# Patient Record
Sex: Male | Born: 1950 | Race: White | Hispanic: No | Marital: Married | Smoking: Never smoker
Health system: Southern US, Community
[De-identification: ages and names within clinical notes are randomized; demographics above are authoritative.]

## PROBLEM LIST (undated history)

## (undated) DIAGNOSIS — M25511 Pain in right shoulder: Secondary | ICD-10-CM

## (undated) DIAGNOSIS — M1990 Unspecified osteoarthritis, unspecified site: Secondary | ICD-10-CM

## (undated) DIAGNOSIS — M21372 Foot drop, left foot: Principal | ICD-10-CM

## (undated) DIAGNOSIS — I639 Cerebral infarction, unspecified: Secondary | ICD-10-CM

## (undated) DIAGNOSIS — Q2112 Patent foramen ovale: Secondary | ICD-10-CM

## (undated) DIAGNOSIS — Q211 Atrial septal defect: Secondary | ICD-10-CM

## (undated) DIAGNOSIS — I10 Essential (primary) hypertension: Secondary | ICD-10-CM

## (undated) HISTORY — DX: Foot drop, left foot: M21.372

## (undated) HISTORY — PX: ROTATOR CUFF REPAIR: SHX139

## (undated) HISTORY — PX: SHOULDER ARTHROSCOPY: SHX128

---

## 2011-01-08 ENCOUNTER — Other Ambulatory Visit (HOSPITAL_COMMUNITY): Payer: Self-pay | Admitting: Family Medicine

## 2011-01-08 ENCOUNTER — Ambulatory Visit (HOSPITAL_COMMUNITY)
Admission: RE | Admit: 2011-01-08 | Discharge: 2011-01-08 | Disposition: A | Discharge status: Home / Self Care | Payer: BC Managed Care – PPO | Source: Ambulatory Visit | Attending: Family Medicine | Admitting: Family Medicine

## 2011-01-08 DIAGNOSIS — M542 Cervicalgia: Secondary | ICD-10-CM | POA: Insufficient documentation

## 2011-01-08 DIAGNOSIS — R609 Edema, unspecified: Secondary | ICD-10-CM | POA: Insufficient documentation

## 2011-01-08 DIAGNOSIS — R221 Localized swelling, mass and lump, neck: Secondary | ICD-10-CM

## 2011-01-08 MED ORDER — IOHEXOL 300 MG/ML  SOLN
75.0000 mL | Freq: Once | INTRAMUSCULAR | Status: AC | PRN
  Administered 2011-01-08: 75 mL via INTRAVENOUS

## 2015-08-11 ENCOUNTER — Ambulatory Visit (HOSPITAL_COMMUNITY)
Admission: RE | Admit: 2015-08-11 | Discharge: 2015-08-11 | Disposition: A | Discharge status: Home / Self Care | Payer: BLUE CROSS/BLUE SHIELD | Source: Ambulatory Visit | Attending: Family Medicine | Admitting: Family Medicine

## 2015-08-11 ENCOUNTER — Other Ambulatory Visit (HOSPITAL_COMMUNITY): Payer: Self-pay | Admitting: Family Medicine

## 2015-08-11 DIAGNOSIS — M5032 Other cervical disc degeneration, mid-cervical region: Secondary | ICD-10-CM | POA: Diagnosis not present

## 2015-08-11 DIAGNOSIS — M5136 Other intervertebral disc degeneration, lumbar region: Secondary | ICD-10-CM | POA: Insufficient documentation

## 2015-08-11 DIAGNOSIS — M21372 Foot drop, left foot: Secondary | ICD-10-CM | POA: Insufficient documentation

## 2015-08-11 DIAGNOSIS — M25512 Pain in left shoulder: Secondary | ICD-10-CM | POA: Diagnosis not present

## 2015-08-11 DIAGNOSIS — M545 Low back pain: Secondary | ICD-10-CM | POA: Diagnosis present

## 2015-08-11 DIAGNOSIS — R531 Weakness: Secondary | ICD-10-CM | POA: Diagnosis not present

## 2015-08-11 DIAGNOSIS — M549 Dorsalgia, unspecified: Secondary | ICD-10-CM

## 2015-08-13 ENCOUNTER — Ambulatory Visit (INDEPENDENT_AMBULATORY_CARE_PROVIDER_SITE_OTHER): Payer: BLUE CROSS/BLUE SHIELD | Admitting: Neurology

## 2015-08-13 ENCOUNTER — Encounter: Payer: Self-pay | Admitting: Neurology

## 2015-08-13 VITALS — BP 133/87 | HR 75 | Ht 73.0 in | Wt 182.0 lb

## 2015-08-13 DIAGNOSIS — M21372 Foot drop, left foot: Secondary | ICD-10-CM

## 2015-08-13 HISTORY — DX: Foot drop, left foot: M21.372

## 2015-08-13 NOTE — Progress Notes (Signed)
Reason for visit: Left footdrop  Referring physician: Dr. Titus Dubin is a 64 y.o. male  History of present illness:  Mr. Koren is a 64 year old right-handed white male with a history of sudden onset of a footdrop that occurred 3 days ago. The patient woke up with the deficit, associated with some mild discomfort with weightbearing on the foot, but with no back pain or pain down the leg. The patient had some left anterolateral lower extremity numbness and tingling. The patient denies any issues with the right leg. He has not had any previous events of sudden weakness or numbness. He denies problems controlling the bowels or the bladder. He also noted a "crick in the neck" when he woke up on the day that he noticed the left sided footdrop. He denies any falls, he occasionally catches his toes with walking. He is sent to this office for further evaluation.  Past Medical History  Diagnosis Date  . Hypertension   . Left foot drop 08/13/2015    Past Surgical History  Procedure Laterality Date  . Rotator cuff repair      x3    Family History  Problem Relation Age of Onset  . COPD Mother   . Heart failure Mother   . Skin cancer Mother   . ALS Father     Social history:  reports that he has never smoked. He has never used smokeless tobacco. He reports that he does not drink alcohol or use illicit drugs.  Medications:  Prior to Admission medications   Medication Sig Start Date End Date Taking? Authorizing Provider  aspirin 81 MG tablet Take 81 mg by mouth daily.   Yes Historical Provider, MD  hydrochlorothiazide (HYDRODIURIL) 25 MG tablet Take 25 mg by mouth daily.   Yes Historical Provider, MD  lisinopril (PRINIVIL,ZESTRIL) 10 MG tablet Take 10 mg by mouth daily.   Yes Historical Provider, MD     No Known Allergies  ROS:  Out of a complete 14 system review of symptoms, the patient complains only of the following symptoms, and all other reviewed systems are  negative.  Numbness, weakness  Blood pressure 133/87, pulse 75, height  (1.854 m), weight 182 lb (82.555 kg).  Physical Exam  General: The patient is alert and cooperative at the time of the examination.  Eyes: Pupils are equal, round, and reactive to light. Discs are flat bilaterally.  Neck: The neck is supple, no carotid bruits are noted.  Respiratory: The respiratory examination is clear.  Cardiovascular: The cardiovascular examination reveals a regular rate and rhythm, no obvious murmurs or rubs are noted.  Skin: Extremities are without significant edema.  Neurologic Exam  Mental status: The patient is alert and oriented x 3 at the time of the examination. The patient has apparent normal recent and remote memory, with an apparently normal attention span and concentration ability.  Cranial nerves: Facial symmetry is present. There is good sensation of the face to pinprick and soft touch bilaterally. The strength of the facial muscles and the muscles to head turning and shoulder shrug are normal bilaterally. Speech is well enunciated, no aphasia or dysarthria is noted. Extraocular movements are full. Visual fields are full. The tongue is midline, and the patient has symmetric elevation of the soft palate. No obvious hearing deficits are noted.  Motor: The motor testing reveals 5 over 5 strength of all 4 extremities, with exception of mild weakness with dorsiflexion and eversion of the left foot.  There is good strength with inversion of the left foot. Good symmetric motor tone is noted throughout.  Sensory: Sensory testing is intact to pinprick, soft touch, vibration sensation, and position sense on all 4 extremities. No evidence of extinction is noted.  Coordination: Cerebellar testing reveals good finger-nose-finger and heel-to-shin bilaterally.  Gait and station: Gait is normal. Tandem gait is normal. Romberg is negative. No drift is seen. The patient is able to walk on the  toes bilaterally, he is able to walk on heels on the right foot, not on the left.  Reflexes: Deep tendon reflexes are symmetric and normal bilaterally. Toes are downgoing bilaterally.   Assessment/Plan:  1. Left peroneal neuropathy, left foot drop  The patient has an examination fully consistent with the left peroneal neuropathy. The etiology of this is not clear. The patient will be sent for blood work today, nerve conduction studies will be set up involving both lower extremities and EMG will be done on the left leg. The patient does not have a clinical examination consistent with a peripheral neuropathy. He will follow-up for the EMG evaluation.  Marlan Palau MD 08/13/2015 7:25 PM  [facility] [address] [place], [place] [address]  Phone [phone] Fax [phone]

## 2015-08-13 NOTE — Patient Instructions (Signed)
   We will check EMG and NCV studies and blood work.

## 2015-08-15 ENCOUNTER — Telehealth: Payer: Self-pay

## 2015-08-15 LAB — MULTIPLE MYELOMA PANEL, SERUM
ALBUMIN SERPL ELPH-MCNC: 4.2 g/dL (ref 2.9–4.4)
ALBUMIN/GLOB SERPL: 1.7 (ref 0.7–1.7)
ALPHA 1: 0.2 g/dL (ref 0.0–0.4)
Alpha2 Glob SerPl Elph-Mcnc: 0.6 g/dL (ref 0.4–1.0)
B-GLOBULIN SERPL ELPH-MCNC: 0.7 g/dL (ref 0.7–1.3)
GAMMA GLOB SERPL ELPH-MCNC: 1 g/dL (ref 0.4–1.8)
GLOBULIN, TOTAL: 2.5 g/dL (ref 2.2–3.9)
IgA/Immunoglobulin A, Serum: 78 mg/dL (ref 61–437)
IgG (Immunoglobin G), Serum: 869 mg/dL (ref 700–1600)
IgM (Immunoglobulin M), Srm: 244 mg/dL — ABNORMAL HIGH (ref 20–172)
Total Protein: 6.7 g/dL (ref 6.0–8.5)

## 2015-08-15 LAB — SEDIMENTATION RATE: SED RATE: 2 mm/h (ref 0–30)

## 2015-08-15 LAB — ANGIOTENSIN CONVERTING ENZYME

## 2015-08-15 LAB — RHEUMATOID FACTOR

## 2015-08-15 LAB — ANA W/REFLEX: ANA: NEGATIVE

## 2015-08-15 NOTE — Telephone Encounter (Signed)
I called the patient and relayed results. 

## 2015-08-15 NOTE — Telephone Encounter (Signed)
-----   Message from York Spaniel, MD sent at 08/15/2015 11:46 AM EDT -----  The blood work results are unremarkable. Please call the patient.  ----- Message -----    From: [facility] Lab Results In Interface    Sent: 08/14/2015   7:44 AM      To: York Spaniel, MD

## 2015-09-02 ENCOUNTER — Ambulatory Visit: Payer: Self-pay | Admitting: Neurology

## 2015-09-10 ENCOUNTER — Ambulatory Visit (INDEPENDENT_AMBULATORY_CARE_PROVIDER_SITE_OTHER): Payer: Self-pay | Admitting: Neurology

## 2015-09-10 ENCOUNTER — Encounter: Payer: Self-pay | Admitting: Neurology

## 2015-09-10 ENCOUNTER — Ambulatory Visit (INDEPENDENT_AMBULATORY_CARE_PROVIDER_SITE_OTHER): Payer: BLUE CROSS/BLUE SHIELD | Admitting: Neurology

## 2015-09-10 DIAGNOSIS — M21372 Foot drop, left foot: Secondary | ICD-10-CM

## 2015-09-10 NOTE — Procedures (Signed)
     HISTORY:  Fred Russell is a 64 year old gentleman who developed a left-sided foot drop spontaneously around 08/10/2015. The patient has already noted significant improvement of the strength of the left leg, he is now able to walk on heels bilaterally. He is returning for an evaluation. He denies any back pain or pain down the leg.  NERVE CONDUCTION STUDIES:  Nerve conduction studies were performed on both lower extremities. The distal motor latencies and motor amplitudes for the peroneal and posterior tibial nerves were within normal limits. The nerve conduction velocities for these nerves were also normal. The H reflex latencies were normal. The sensory latencies for the peroneal nerves were within normal limits.   EMG STUDIES:  A limited EMG study was performed on the left lower extremity:  The tibialis anterior muscle reveals 2 to 4K motor units with full recruitment. No fibrillations or positive waves were seen. The peroneus tertius muscle reveals 2 to 4K motor units with full recruitment. No fibrillations or positive waves were seen. The medial gastrocnemius muscle reveals 1 to 3K motor units with full recruitment. No fibrillations or positive waves were seen. The vastus lateralis muscle reveals 2 to 4K motor units with full recruitment. No fibrillations or positive waves were seen.   IMPRESSION:  Nerve conduction studies done on both lower extremity is were unremarkable, no evidence of a peripheral neuropathy is seen. Limited EMG evaluation of the left lower extremity shows denervation within the left peroneal nerve distribution, consistent with a left peroneal neuropathy at the fibular head.  Fred Russell. Fred Taraya Steward MD 09/10/2015 1:44 PM  [facility] [address] [place], [place] [id number]  Phone [phone] Fax [phone]

## 2015-09-10 NOTE — Progress Notes (Signed)
Please refer to EMG and nerve conduction study procedure note. 

## 2015-09-29 ENCOUNTER — Other Ambulatory Visit: Payer: Self-pay | Admitting: Orthopedic Surgery

## 2015-09-29 DIAGNOSIS — M8580 Other specified disorders of bone density and structure, unspecified site: Secondary | ICD-10-CM

## 2015-10-26 ENCOUNTER — Emergency Department (HOSPITAL_COMMUNITY): Payer: BLUE CROSS/BLUE SHIELD

## 2015-10-26 ENCOUNTER — Observation Stay (HOSPITAL_COMMUNITY): Payer: BLUE CROSS/BLUE SHIELD

## 2015-10-26 ENCOUNTER — Inpatient Hospital Stay (HOSPITAL_COMMUNITY)
Admission: EM | Admit: 2015-10-26 | Discharge: 2015-10-29 | DRG: 041 | Disposition: A | Discharge status: Home / Self Care | Payer: BLUE CROSS/BLUE SHIELD | Attending: Family Medicine | Admitting: Family Medicine

## 2015-10-26 ENCOUNTER — Encounter (HOSPITAL_COMMUNITY): Payer: Self-pay | Admitting: Emergency Medicine

## 2015-10-26 DIAGNOSIS — I63411 Cerebral infarction due to embolism of right middle cerebral artery: Principal | ICD-10-CM | POA: Diagnosis present

## 2015-10-26 DIAGNOSIS — I6330 Cerebral infarction due to thrombosis of unspecified cerebral artery: Secondary | ICD-10-CM

## 2015-10-26 DIAGNOSIS — I639 Cerebral infarction, unspecified: Secondary | ICD-10-CM

## 2015-10-26 DIAGNOSIS — Z86718 Personal history of other venous thrombosis and embolism: Secondary | ICD-10-CM

## 2015-10-26 DIAGNOSIS — M21372 Foot drop, left foot: Secondary | ICD-10-CM | POA: Diagnosis present

## 2015-10-26 DIAGNOSIS — E785 Hyperlipidemia, unspecified: Secondary | ICD-10-CM | POA: Diagnosis present

## 2015-10-26 DIAGNOSIS — Z7982 Long term (current) use of aspirin: Secondary | ICD-10-CM

## 2015-10-26 DIAGNOSIS — R4701 Aphasia: Secondary | ICD-10-CM | POA: Diagnosis present

## 2015-10-26 DIAGNOSIS — I10 Essential (primary) hypertension: Secondary | ICD-10-CM | POA: Diagnosis not present

## 2015-10-26 DIAGNOSIS — I712 Thoracic aortic aneurysm, without rupture: Secondary | ICD-10-CM | POA: Diagnosis present

## 2015-10-26 DIAGNOSIS — Z66 Do not resuscitate: Secondary | ICD-10-CM | POA: Diagnosis present

## 2015-10-26 DIAGNOSIS — Z79899 Other long term (current) drug therapy: Secondary | ICD-10-CM

## 2015-10-26 DIAGNOSIS — I6350 Cerebral infarction due to unspecified occlusion or stenosis of unspecified cerebral artery: Secondary | ICD-10-CM | POA: Diagnosis not present

## 2015-10-26 DIAGNOSIS — R29810 Facial weakness: Secondary | ICD-10-CM | POA: Diagnosis present

## 2015-10-26 DIAGNOSIS — R29701 NIHSS score 1: Secondary | ICD-10-CM | POA: Diagnosis present

## 2015-10-26 DIAGNOSIS — Q211 Atrial septal defect: Secondary | ICD-10-CM

## 2015-10-26 DIAGNOSIS — N179 Acute kidney failure, unspecified: Secondary | ICD-10-CM | POA: Diagnosis present

## 2015-10-26 DIAGNOSIS — Q2112 Patent foramen ovale: Secondary | ICD-10-CM | POA: Insufficient documentation

## 2015-10-26 LAB — RAPID URINE DRUG SCREEN, HOSP PERFORMED
Amphetamines: NOT DETECTED
BARBITURATES: NOT DETECTED
BENZODIAZEPINES: NOT DETECTED
Cocaine: NOT DETECTED
Opiates: NOT DETECTED
Tetrahydrocannabinol: NOT DETECTED

## 2015-10-26 LAB — COMPREHENSIVE METABOLIC PANEL
ALT: 18 U/L (ref 17–63)
AST: 23 U/L (ref 15–41)
Albumin: 4 g/dL (ref 3.5–5.0)
Alkaline Phosphatase: 45 U/L (ref 38–126)
Anion gap: 7 (ref 5–15)
BILIRUBIN TOTAL: 0.9 mg/dL (ref 0.3–1.2)
BUN: 19 mg/dL (ref 6–20)
CHLORIDE: 102 mmol/L (ref 101–111)
CO2: 29 mmol/L (ref 22–32)
CREATININE: 1.45 mg/dL — AB (ref 0.61–1.24)
Calcium: 9.4 mg/dL (ref 8.9–10.3)
GFR calc Af Amer: 57 mL/min — ABNORMAL LOW (ref >60)
GFR, EST NON AFRICAN AMERICAN: 49 mL/min — AB (ref >60)
Glucose, Bld: 115 mg/dL — ABNORMAL HIGH (ref 65–99)
Potassium: 3.7 mmol/L (ref 3.5–5.1)
Sodium: 138 mmol/L (ref 135–145)
Total Protein: 6.5 g/dL (ref 6.5–8.1)

## 2015-10-26 LAB — I-STAT CHEM 8, ED
BUN: 24 mg/dL — ABNORMAL HIGH (ref 6–20)
CHLORIDE: 98 mmol/L — AB (ref 101–111)
CREATININE: 1.4 mg/dL — AB (ref 0.61–1.24)
Calcium, Ion: 1.15 mmol/L (ref 1.13–1.30)
GLUCOSE: 109 mg/dL — AB (ref 65–99)
HCT: 45 % (ref 39.0–52.0)
Hemoglobin: 15.3 g/dL (ref 13.0–17.0)
Potassium: 3.7 mmol/L (ref 3.5–5.1)
SODIUM: 139 mmol/L (ref 135–145)
TCO2: 30 mmol/L (ref 0–100)

## 2015-10-26 LAB — DIFFERENTIAL
BASOS ABS: 0 10*3/uL (ref 0.0–0.1)
BASOS PCT: 0 %
Eosinophils Absolute: 0 10*3/uL (ref 0.0–0.7)
Eosinophils Relative: 0 %
LYMPHS ABS: 1.5 10*3/uL (ref 0.7–4.0)
LYMPHS PCT: 18 %
MONOS PCT: 4 %
Monocytes Absolute: 0.3 10*3/uL (ref 0.1–1.0)
NEUTROS ABS: 6.5 10*3/uL (ref 1.7–7.7)
Neutrophils Relative %: 78 %

## 2015-10-26 LAB — CBC
HEMATOCRIT: 41.2 % (ref 39.0–52.0)
HEMOGLOBIN: 14.1 g/dL (ref 13.0–17.0)
MCH: 31.8 pg (ref 26.0–34.0)
MCHC: 34.2 g/dL (ref 30.0–36.0)
MCV: 93 fL (ref 78.0–100.0)
Platelets: 177 10*3/uL (ref 150–400)
RBC: 4.43 MIL/uL (ref 4.22–5.81)
RDW: 12.9 % (ref 11.5–15.5)
WBC: 8.4 10*3/uL (ref 4.0–10.5)

## 2015-10-26 LAB — URINALYSIS, ROUTINE W REFLEX MICROSCOPIC
BILIRUBIN URINE: NEGATIVE
Glucose, UA: NEGATIVE mg/dL
HGB URINE DIPSTICK: NEGATIVE
KETONES UR: NEGATIVE mg/dL
Leukocytes, UA: NEGATIVE
Nitrite: NEGATIVE
PROTEIN: NEGATIVE mg/dL
Specific Gravity, Urine: 1.009 (ref 1.005–1.030)
pH: 6.5 (ref 5.0–8.0)

## 2015-10-26 LAB — PROTIME-INR
INR: 1.1 (ref 0.00–1.49)
Prothrombin Time: 14.4 seconds (ref 11.6–15.2)

## 2015-10-26 LAB — GLUCOSE, CAPILLARY: Glucose-Capillary: 112 mg/dL — ABNORMAL HIGH (ref 65–99)

## 2015-10-26 LAB — APTT: APTT: 29 s (ref 24–37)

## 2015-10-26 LAB — TSH: TSH: 2.885 u[IU]/mL (ref 0.350–4.500)

## 2015-10-26 LAB — ETHANOL: Alcohol, Ethyl (B): <5 mg/dL (ref <5)

## 2015-10-26 LAB — I-STAT TROPONIN, ED: TROPONIN I, POC: 0 ng/mL (ref 0.00–0.08)

## 2015-10-26 MED ORDER — STROKE: EARLY STAGES OF RECOVERY BOOK
Freq: Once | Status: AC
  Administered 2015-10-26: 18:00:00
  Filled 2015-10-26: qty 1

## 2015-10-26 MED ORDER — ASPIRIN 300 MG RE SUPP
300.0000 mg | Freq: Every day | RECTAL | Status: DC
  Filled 2015-10-26: qty 1

## 2015-10-26 MED ORDER — GADOBENATE DIMEGLUMINE 529 MG/ML IV SOLN
17.0000 mL | Freq: Once | INTRAVENOUS | Status: AC | PRN
  Administered 2015-10-26: 17 mL via INTRAVENOUS

## 2015-10-26 MED ORDER — HYDROCHLOROTHIAZIDE 25 MG PO TABS
25.0000 mg | ORAL_TABLET | Freq: Every day | ORAL | Status: DC
  Administered 2015-10-27 – 2015-10-29 (×3): 25 mg via ORAL
  Filled 2015-10-26 (×3): qty 1

## 2015-10-26 MED ORDER — ENOXAPARIN SODIUM 30 MG/0.3ML ~~LOC~~ SOLN
30.0000 mg | SUBCUTANEOUS | Status: DC
  Administered 2015-10-26: 30 mg via SUBCUTANEOUS
  Filled 2015-10-26: qty 0.3

## 2015-10-26 MED ORDER — ASPIRIN 325 MG PO TABS
325.0000 mg | ORAL_TABLET | Freq: Every day | ORAL | Status: DC
  Administered 2015-10-27: 325 mg via ORAL
  Filled 2015-10-26: qty 1

## 2015-10-26 MED ORDER — ASPIRIN 300 MG RE SUPP: 300.0000 mg | Freq: Every day | RECTAL | Status: DC

## 2015-10-26 MED ORDER — ASPIRIN EC 81 MG PO TBEC
243.0000 mg | DELAYED_RELEASE_TABLET | Freq: Once | ORAL | Status: AC
  Administered 2015-10-26: 243 mg via ORAL
  Filled 2015-10-26: qty 3

## 2015-10-26 MED ORDER — ASPIRIN EC 81 MG PO TBEC: 244.0000 mg | DELAYED_RELEASE_TABLET | Freq: Once | ORAL | Status: DC

## 2015-10-26 MED ORDER — SENNOSIDES-DOCUSATE SODIUM 8.6-50 MG PO TABS: 1.0000 | ORAL_TABLET | Freq: Every evening | ORAL | Status: DC | PRN

## 2015-10-26 MED ORDER — ASPIRIN 325 MG PO TABS: 325.0000 mg | ORAL_TABLET | Freq: Every day | ORAL | Status: DC

## 2015-10-26 NOTE — Consult Note (Signed)
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 There is question of slurred speech and told him he should come to checked out.  On arrival, a code stroke was called and the CT head was performed which did show a right frontal infarct.  He apparently has said some odd things such as giving the wrong winning team of a game he should have known about, as well as miss quoting his Estate manager/land agent.  LKW: 8 AM tpa given?: no, out of IV TPA window    ROS: A 14 point ROS was performed and is negative except as noted in the HPI.   Past Medical History  Diagnosis Date  . Hypertension   . Left foot drop 08/13/2015     Family History  Problem Relation Age of Onset  . COPD Mother   . Heart failure Mother   . Skin cancer Mother   . ALS Father      Social History:  reports that he has never smoked. He has never used smokeless tobacco. He reports that he does not drink alcohol or use illicit drugs.   Exam: Current vital signs: BP 143/88 mmHg  Pulse 64  Temp(Src) 98.4 F (36.9 C) (Oral)  Resp 16  Ht  (1.88 m)  Wt 79.379 kg (175 lb)  BMI 22.46 kg/m2  SpO2 98% Vital signs in last 24 hours: Temp:  [97.7 F (36.5 C)-98.4 F (36.9 C)] 98.4 F (36.9 C) (12/11 1645) Pulse Rate:  [64-85] 64 (12/11 1645) Resp:  [12-24] 16 (12/11 1645) BP: (143-165)/(86-100) 143/88 mmHg (12/11 1645) SpO2:  [98 %-100 %] 98 % (12/11 1645) Weight:  [79.379 kg (175 lb)] 79.379 kg (175 lb) (12/11 1449)   Physical Exam  Constitutional: Appears well-developed and well-nourished.  Psych: Affect appropriate to situation Eyes: No scleral injection HENT:  No OP obstrucion Head: Normocephalic.  Cardiovascular: Normal rate and regular rhythm.  Respiratory: Effort normal and breath sounds normal to anterior ascultation GI: Soft.  No distension. There is no tenderness.  Skin: WDI  Neuro: Mental Status: Patient is awake, alert, oriented to person, place, month, year, and situation. Patient is able to give a clear and coherent history. No signs of aphasia or neglect Cranial Nerves: II: Visual Fields are full. Pupils are equal, round, and reactive to light.   III,IV, VI: EOMI without ptosis or diploplia.  V: Facial sensation is symmetric to temperature VII: Facial movement is notable for mild left facial weakness VIII: hearing is intact to voice X: Uvula elevates symmetrically XI: Shoulder shrug is symmetric. XII: tongue is midline without atrophy or fasciculations.  Motor: Tone is normal. Bulk is normal. 5/5 strength was present in all four extremities.  Sensory: Sensation is symmetric to light touch and temperature in the arms and legs. Deep Tendon Reflexes: 2+ and symmetric in the biceps and patellae.  Plantars: Toes are downgoing bilaterally.  Cerebellar: FNF and HKS are intact bilaterally         I have reviewed labs in epic and the results pertinent to this consultation are: Mildly elevated creatinine  I have reviewed the images obtained: CT head-hypodensity  in the right frontal region  Impression:  64 year old male with a history of hypertension who presents with right frontal infarct. Location is concerning for possible embolic nature, he will need a further workup.  Recommendations: 1. HgbA1c, fasting lipid panel 2. MRI, MRA  of the brain without contrast, MRA of the neck with contrast 3. Frequent neuro checks 4. Echocardiogram 5. Prophylactic therapy-Antiplatelet med: Aspirin - dose 325mg  PO or 300mg  PR 6. Risk factor modification 7. Telemetry monitoring 8. PT consult, OT consult, Speech consult   Ritta SlotMcNeill  Merwin Breden, MD [facility] [phone]  If 7pm- 7am, please page neurology on call as listed in AMION.

## 2015-10-26 NOTE — ED Notes (Signed)
LSN is 800. Not 1130

## 2015-10-26 NOTE — H&P (Signed)
Family Medicine Teaching [facility] Admission History and Physical Service Pager: [phone]  Patient name: Fred Russell Medical record number: [id number] Date of birth: 1951-04-17 Age: 64 y.o. Gender: male  Primary Care Provider: Milana Obey, MD Consultants: Neuro Code Status: DNR (discussed on admission)  Chief Complaint: Facial Droop  Assessment and Plan: Fred Russell is a 64 y.o. male presenting with . PMH is significant for HTN.   #CVA: Found to have aphasia, facial droop, and altered mental status this morning at church. CT head showed new small right anterior frontal infarct. Not a candidate for tPA. Asymptomatic on my exam so symptoms have greatly improved. Code stroke order set was initiated in ED. Already taken ASA  at home. Troponin was negative. ECG overall unremarkable.  - admit for observation, Dr. Pollie Meyer attending - Neuro consulted, appreciate recs - A1c, TSH, and lipids for risk stratification - echo ordered - neuro checks, tele - ASA 325 daily; already took  tablet this morning - PT/OT/SLP evaluation - DG chest pending  - MRI, MRA of the brain without contrast, MRA of the neck with contrast  #HTN: Bps elevated in ED most likely 2/2 stroke. He has taken his BP medications already today. Will allow for permissive HTN. States BP usually well controlled and <140/80.  -Hold Lisinopril; restart HCTZ in AM  FEN/GI: NPO until passes swallow eval; saline lock IV Prophylaxis: Lovenox  Disposition: Admit to med-surg for stroke work-up.  History of Present Illness:  Fred Russell is a 64 y.o. male presenting with with aphasia, confusion, and left facial droop. 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 He states that members of the church stated that he was having aphasia, confusion, facial droop with drooling. Wife also mentioned that people were saying that patient was staring off blankly and not acting like himself  Patient believes the symptoms were exaggerated. He does not recall having any of these symptoms and thinks that people were exaggerating.   Wife who is present in room states that patient appeared normal last night without symptoms and again briefly this morning when she saw him.  Patient denies any weakness, headaches, chest pain, and shortness of breath. He states that he is a pretty healthy gentleman who exercises daily. Only medical condition is high blood pressure that is well controlled on medications.  On arrival, a code stroke was called in ED.  Review Of Systems: Per HPI with the following additions: fevers, abdominal pain. Otherwise the remainder of the systems were negative.  Patient Active Problem List   Diagnosis Date Noted  . Left foot drop 08/13/2015    Past Medical History: Past Medical History  Diagnosis Date  . Hypertension   . Left foot drop 08/13/2015    Past Surgical History: Past Surgical History  Procedure Laterality Date  . Rotator cuff repair      x3    Social History: Social History  Substance Use Topics  . Smoking status: Never Smoker   . Smokeless tobacco: Never Used  . Alcohol Use: No   No illicit drugs  Exercises daily  Additional social history: Lives with wife, retired Education officer, community, pastor Please also refer to relevant sections of EMR.  Family History: Family History  Problem Relation Age of Onset  . COPD Mother   . Heart failure Mother   . Skin cancer Mother   . ALS Father    Allergies and Medications: No Known Allergies No current facility-administered  medications on file prior to encounter.   Current Outpatient Prescriptions on File Prior to Encounter  Medication Sig Dispense Refill  . aspirin 81 MG tablet Take 81 mg by mouth daily.    . hydrochlorothiazide (HYDRODIURIL) 25 MG tablet Take 25 mg by mouth daily.    [name] lisinopril (PRINIVIL,ZESTRIL) 10 MG tablet Take 10 mg by mouth daily.      Objective: BP 147/92 mmHg  Pulse 69   Temp(Src) 98 F (36.7 C) (Oral)  Resp 12  Ht  (1.88 m)  Wt 175 lb (79.379 kg)  BMI 22.46 kg/m2  SpO2 100% Exam: General: alert, well-developed, NAD, cooperative HEET: NCAT, vision grossly intact, PERRLA, no injection and anicteric. EOMI. MMM, oral mucosa and oropharynx reveal no lesions or exudates.  Neck: supple, full ROM, no thyromegaly. No deformities, masses, or tenderness noted. No carotid bruit appreciated.  Lungs: CTAB, normal respiratory effort, no crackles, and no wheezes.  Heart: RRR, no M/R/G.  Abdomen: Bowel sounds normal; abdomen soft and nontender. No masses, organomegaly or hernias noted. no guarding, no rebound tenderness Pulses: DP/PT are full and equal bilaterally.  Extremities: No cyanosis, clubbing, edema Neurologic: No focal deficits, CN II-XII intact,+5 strength globally, sensation grossly intact, A&Ox3. Deep tendon reflexes symmetrical and normal. No aphasia.  Skin: Intact without suspicious lesions or rashes. Warm and dry. Psych: Mood and affect are normal; no evidence of anxiety or depression.  Labs and Imaging: Results for orders placed or performed during the hospital encounter of 10/26/15 (from the past 24 hour(s))  Ethanol     Status: None   Collection Time: 10/26/15  3:16 PM  Result Value Ref Range   Alcohol, Ethyl (B) <5 <5 mg/dL  Protime-INR     Status: None   Collection Time: 10/26/15  3:17 PM  Result Value Ref Range   Prothrombin Time 14.4 11.6 - 15.2 seconds   INR 1.10 0.00 - 1.49  APTT     Status: None   Collection Time: 10/26/15  3:17 PM  Result Value Ref Range   aPTT 29 24 - 37 seconds  CBC     Status: None   Collection Time: 10/26/15  3:17 PM  Result Value Ref Range   WBC 8.4 4.0 - 10.5 K/uL   RBC 4.43 4.22 - 5.81 MIL/uL   Hemoglobin 14.1 13.0 - 17.0 g/dL   HCT 40.9 81.1 - 91.4 %   MCV 93.0 78.0 - 100.0 fL   MCH 31.8 26.0 - 34.0 pg   MCHC 34.2 30.0 - 36.0 g/dL   RDW 78.2 95.6 - 21.3 %   Platelets 177 150 - 400 K/uL   Differential     Status: None   Collection Time: 10/26/15  3:17 PM  Result Value Ref Range   Neutrophils Relative % 78 %   Neutro Abs 6.5 1.7 - 7.7 K/uL   Lymphocytes Relative 18 %   Lymphs Abs 1.5 0.7 - 4.0 K/uL   Monocytes Relative 4 %   Monocytes Absolute 0.3 0.1 - 1.0 K/uL   Eosinophils Relative 0 %   Eosinophils Absolute 0.0 0.0 - 0.7 K/uL   Basophils Relative 0 %   Basophils Absolute 0.0 0.0 - 0.1 K/uL  Comprehensive metabolic panel     Status: Abnormal   Collection Time: 10/26/15  3:17 PM  Result Value Ref Range   Sodium 138 135 - 145 mmol/L   Potassium 3.7 3.5 - 5.1 mmol/L   Chloride 102 101 - 111 mmol/L   CO2 29  22 - 32 mmol/L   Glucose, Bld 115 (H) 65 - 99 mg/dL   BUN 19 6 - 20 mg/dL   Creatinine, Ser 1.611.45 (H) 0.61 - 1.24 mg/dL   Calcium 9.4 8.9 - 09.610.3 mg/dL   Total Protein 6.5 6.5 - 8.1 g/dL   Albumin 4.0 3.5 - 5.0 g/dL   AST 23 15 - 41 U/L   ALT 18 17 - 63 U/L   Alkaline Phosphatase 45 38 - 126 U/L   Total Bilirubin 0.9 0.3 - 1.2 mg/dL   GFR calc non Af Amer 49 (L) >60 mL/min   GFR calc Af Amer 57 (L) >60 mL/min   Anion gap 7 5 - 15  I-stat troponin, ED ([facility])     Status: None   Collection Time: 10/26/15  3:18 PM  Result Value Ref Range   Troponin i, poc 0.00 0.00 - 0.08 ng/mL   Comment 3          I-Stat Chem 8, ED  (not at [facility], [facility])     Status: Abnormal   Collection Time: 10/26/15  3:20 PM  Result Value Ref Range   Sodium 139 135 - 145 mmol/L   Potassium 3.7 3.5 - 5.1 mmol/L   Chloride 98 (L) 101 - 111 mmol/L   BUN 24 (H) 6 - 20 mg/dL   Creatinine, Ser 0.451.40 (H) 0.61 - 1.24 mg/dL   Glucose, Bld 409109 (H) 65 - 99 mg/dL   Calcium, Ion 8.111.15 9.141.13 - 1.30 mmol/L   TCO2 30 0 - 100 mmol/L   Hemoglobin 15.3 13.0 - 17.0 g/dL   HCT 78.245.0 95.639.0 - 21.352.0 %   Ct Head Wo Contrast  10/26/2015  CLINICAL DATA:  Acute stroke symptoms, left facial droop, confusion, ectasia complicated stroke EXAM: CT HEAD WITHOUT CONTRAST TECHNIQUE: Contiguous axial images were  obtained from the base of the skull through the vertex without contrast. COMPARISON:  None available FINDINGS: Small right frontal ill-defined hypodensity with loss of gray-white differentiation, images 13-17, compatible with a small acute stroke involving the anterior MCA territory. No associated intracranial hemorrhage, significant mass effect, midline shift, herniation or hydrocephalus. Symmetric midline ventricles. Cisterns are patent. No cerebellar abnormality. Atherosclerosis of the intracranial vessels of the skullbase. Orbits are symmetric. Mastoids and sinuses remain clear. No skull abnormality. IMPRESSION: Small right anterior frontal hypodensity compatible with acute infarction, no associated hemorrhage or significant mass effect. These results were called by telephone at the time of interpretation on 10/26/2015 at 3:15 pm to Dr. Amada JupiterKirkpatrick, who verbally acknowledged these results. Electronically Signed   By: Judie PetitM.  Shick M.D.   On: 10/26/2015 15:16    Pincus LargeJazma Y Donnis Phaneuf, DO 10/26/2015, 3:40 PM PGY-2, [facility] FPTS Intern pager: [phone], text pages welcome

## 2015-10-26 NOTE — ED Notes (Signed)
q2H neuro checks per kirkpatrick

## 2015-10-26 NOTE — ED Notes (Signed)
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 Pt with left sided facial droop. Per family pt speech is cleared but confusion remains. Pt does not recognize that he had confusion or facial droop.

## 2015-10-26 NOTE — ED Provider Notes (Addendum)
CSN: [id number]     Arrival date & time 10/26/15  1433 History   First MD Initiated Contact with Patient 10/26/15 1501     Chief Complaint  Patient presents with  . Facial Droop  . Aphasia  . Altered Mental Status     (Consider location/radiation/quality/duration/timing/severity/associated sxs/prior Treatment) HPI Comments: Patient is a 64 year old male with a history of hypertension presenting today with strokelike symptoms. His wife states she saw him around 8 AM this morning and he seemed normal but she only had a very brief interaction with him. When he was at church around 11:30 someone noticed that he had left-sided facial droop, slurred speech, staring off into the distance and aphasia. Patient does not recall any of this but family states he still slightly confused. At no time did he have chest pain, shortness of breath, palpitations, headache, syncope. He has never had symptoms like this before. He has not changed any medications recently and took all of his morning medications prior to leaving the house. He currently has no complaints.  Patient is a 64 y.o. male presenting with altered mental status. The history is provided by the patient and the spouse.  Altered Mental Status   Past Medical History  Diagnosis Date  . Hypertension   . Left foot drop 08/13/2015   Past Surgical History  Procedure Laterality Date  . Rotator cuff repair      x3   Family History  Problem Relation Age of Onset  . COPD Mother   . Heart failure Mother   . Skin cancer Mother   . ALS Father    Social History  Substance Use Topics  . Smoking status: Never Smoker   . Smokeless tobacco: Never Used  . Alcohol Use: No    Review of Systems  All other systems reviewed and are negative.     Allergies  Review of patient's allergies indicates no known allergies.  Home Medications   Prior to Admission medications   Medication Sig Start Date End Date Taking? Authorizing Provider  aspirin 81  MG tablet Take 81 mg by mouth daily.    Historical Provider, MD  hydrochlorothiazide (HYDRODIURIL) 25 MG tablet Take 25 mg by mouth daily.    Historical Provider, MD  lisinopril (PRINIVIL,ZESTRIL) 10 MG tablet Take 10 mg by mouth daily.    Historical Provider, MD   BP 165/86 mmHg  Pulse 85  Temp(Src) 97.7 F (36.5 C) (Oral)  Resp 18  Ht 6\' 2"  (1.88 m)  Wt 175 lb (79.379 kg)  BMI 22.46 kg/m2  SpO2 100% Physical Exam  Constitutional: He is oriented to person, place, and time. He appears well-developed and well-nourished. No distress.  HENT:  Head: Normocephalic and atraumatic.  Mouth/Throat: Oropharynx is clear and moist.  Eyes: Conjunctivae and EOM are normal. Pupils are equal, round, and reactive to light.  Neck: Normal range of motion. Neck supple. No JVD present. Carotid bruit is not present.  Cardiovascular: Normal rate, regular rhythm and intact distal pulses.   No murmur heard. Pulmonary/Chest: Effort normal and breath sounds normal. No respiratory distress. He has no wheezes. He has no rales.  Abdominal: Soft. He exhibits no distension. There is no tenderness. There is no rebound and no guarding.  Musculoskeletal: Normal range of motion. He exhibits no edema or tenderness.  Neurological: He is alert and oriented to person, place, and time. He has normal strength. A cranial nerve deficit is present. No sensory deficit.  Minimal confusion.  Slight left sided  facial droop  Skin: Skin is warm and dry. No rash noted. No erythema.  Psychiatric: He has a normal mood and affect. His behavior is normal.  Nursing note and vitals reviewed.   ED Course  Procedures (including critical care time) Labs Review Labs Reviewed  I-STAT CHEM 8, ED - Abnormal; Notable for the following:    Chloride 98 (*)    BUN 24 (*)    Creatinine, Ser 1.40 (*)    Glucose, Bld 109 (*)    All other components within normal limits  CBC  DIFFERENTIAL  ETHANOL  PROTIME-INR  APTT  COMPREHENSIVE METABOLIC  PANEL  URINE RAPID DRUG SCREEN, HOSP PERFORMED  URINALYSIS, ROUTINE W REFLEX MICROSCOPIC (NOT AT [facility])  I-STAT TROPOININ, ED    Imaging Review Ct Head Wo Contrast  10/26/2015  CLINICAL DATA:  Acute stroke symptoms, left facial droop, confusion, ectasia complicated stroke EXAM: CT HEAD WITHOUT CONTRAST TECHNIQUE: Contiguous axial images were obtained from the base of the skull through the vertex without contrast. COMPARISON:  None available FINDINGS: Small right frontal ill-defined hypodensity with loss of gray-white differentiation, images 13-17, compatible with a small acute stroke involving the anterior MCA territory. No associated intracranial hemorrhage, significant mass effect, midline shift, herniation or hydrocephalus. Symmetric midline ventricles. Cisterns are patent. No cerebellar abnormality. Atherosclerosis of the intracranial vessels of the skullbase. Orbits are symmetric. Mastoids and sinuses remain clear. No skull abnormality. IMPRESSION: Small right anterior frontal hypodensity compatible with acute infarction, no associated hemorrhage or significant mass effect. These results were called by telephone at the time of interpretation on 10/26/2015 at 3:15 pm to Dr. Amada Jupiter, who verbally acknowledged these results. Electronically Signed   By: Judie Petit.  Shick M.D.   On: 10/26/2015 15:16   I have personally reviewed and evaluated these images and lab results as part of my medical decision-making.   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 Wife briefly saw him this morning around 8:00 but was not 100% that he was normal. Currently symptoms are significantly improved from 11:30 but still mildly persistent left facial droop. He has no evidence of aphasia at this time.  NIH of 1.   CT consistent with acute stroke however patient is outside of the window for TPA and has extremely mild symptoms at this time. He has artery taken 81 mg of aspirin today. Code stroke order set initiated and patient will be admitted for further evaluation.    Gwyneth Sprout, MD 10/26/15 1530  Gwyneth Sprout, MD 10/26/15 1534

## 2015-10-27 ENCOUNTER — Observation Stay (HOSPITAL_COMMUNITY): Payer: BLUE CROSS/BLUE SHIELD

## 2015-10-27 ENCOUNTER — Ambulatory Visit (HOSPITAL_COMMUNITY): Payer: BLUE CROSS/BLUE SHIELD

## 2015-10-27 DIAGNOSIS — R4701 Aphasia: Secondary | ICD-10-CM | POA: Diagnosis present

## 2015-10-27 DIAGNOSIS — M21372 Foot drop, left foot: Secondary | ICD-10-CM | POA: Diagnosis present

## 2015-10-27 DIAGNOSIS — I63031 Cerebral infarction due to thrombosis of right carotid artery: Secondary | ICD-10-CM

## 2015-10-27 DIAGNOSIS — I340 Nonrheumatic mitral (valve) insufficiency: Secondary | ICD-10-CM | POA: Diagnosis not present

## 2015-10-27 DIAGNOSIS — Z86718 Personal history of other venous thrombosis and embolism: Secondary | ICD-10-CM | POA: Diagnosis not present

## 2015-10-27 DIAGNOSIS — Z7982 Long term (current) use of aspirin: Secondary | ICD-10-CM | POA: Diagnosis not present

## 2015-10-27 DIAGNOSIS — I6330 Cerebral infarction due to thrombosis of unspecified cerebral artery: Secondary | ICD-10-CM | POA: Diagnosis present

## 2015-10-27 DIAGNOSIS — Z79899 Other long term (current) drug therapy: Secondary | ICD-10-CM | POA: Diagnosis not present

## 2015-10-27 DIAGNOSIS — I10 Essential (primary) hypertension: Secondary | ICD-10-CM

## 2015-10-27 DIAGNOSIS — Z66 Do not resuscitate: Secondary | ICD-10-CM | POA: Diagnosis present

## 2015-10-27 DIAGNOSIS — I639 Cerebral infarction, unspecified: Secondary | ICD-10-CM | POA: Diagnosis not present

## 2015-10-27 DIAGNOSIS — N179 Acute kidney failure, unspecified: Secondary | ICD-10-CM | POA: Diagnosis present

## 2015-10-27 DIAGNOSIS — E785 Hyperlipidemia, unspecified: Secondary | ICD-10-CM

## 2015-10-27 DIAGNOSIS — Q211 Atrial septal defect: Secondary | ICD-10-CM | POA: Diagnosis not present

## 2015-10-27 DIAGNOSIS — R29701 NIHSS score 1: Secondary | ICD-10-CM | POA: Diagnosis present

## 2015-10-27 DIAGNOSIS — I712 Thoracic aortic aneurysm, without rupture: Secondary | ICD-10-CM | POA: Diagnosis present

## 2015-10-27 DIAGNOSIS — R29810 Facial weakness: Secondary | ICD-10-CM | POA: Diagnosis present

## 2015-10-27 DIAGNOSIS — I63411 Cerebral infarction due to embolism of right middle cerebral artery: Secondary | ICD-10-CM | POA: Diagnosis present

## 2015-10-27 LAB — BASIC METABOLIC PANEL
Anion gap: 6 (ref 5–15)
BUN: 17 mg/dL (ref 6–20)
CALCIUM: 9.6 mg/dL (ref 8.9–10.3)
CHLORIDE: 103 mmol/L (ref 101–111)
CO2: 31 mmol/L (ref 22–32)
CREATININE: 1 mg/dL (ref 0.61–1.24)
GFR calc Af Amer: >60 mL/min (ref >60)
GFR calc non Af Amer: >60 mL/min (ref >60)
Glucose, Bld: 97 mg/dL (ref 65–99)
Potassium: 3.7 mmol/L (ref 3.5–5.1)
SODIUM: 140 mmol/L (ref 135–145)

## 2015-10-27 LAB — LIPID PANEL
CHOL/HDL RATIO: 5.2 ratio
Cholesterol: 206 mg/dL — ABNORMAL HIGH (ref 0–200)
HDL: 40 mg/dL — AB (ref >40)
LDL Cholesterol: 142 mg/dL — ABNORMAL HIGH (ref 0–99)
Triglycerides: 119 mg/dL (ref <150)
VLDL: 24 mg/dL (ref 0–40)

## 2015-10-27 LAB — LACTATE DEHYDROGENASE: LDH: 140 U/L (ref 98–192)

## 2015-10-27 LAB — URINALYSIS, ROUTINE W REFLEX MICROSCOPIC
Bilirubin Urine: NEGATIVE
Glucose, UA: NEGATIVE mg/dL
Hgb urine dipstick: NEGATIVE
Ketones, ur: NEGATIVE mg/dL
Leukocytes, UA: NEGATIVE
NITRITE: NEGATIVE
PH: 6 (ref 5.0–8.0)
Protein, ur: NEGATIVE mg/dL
SPECIFIC GRAVITY, URINE: 1.016 (ref 1.005–1.030)

## 2015-10-27 LAB — URINE MICROSCOPIC-ADD ON

## 2015-10-27 LAB — URIC ACID: Uric Acid, Serum: 5 mg/dL (ref 4.4–7.6)

## 2015-10-27 LAB — PHOSPHORUS: Phosphorus: 3.8 mg/dL (ref 2.5–4.6)

## 2015-10-27 MED ORDER — PAR-4 STUDY - PLACEBO /  BMS986141 BOTTLE B OR BLISTER PACK
1.0000 | Freq: Every day | Status: DC
  Administered 2015-10-27 – 2015-10-29 (×3): 1 via ORAL
  Filled 2015-10-27 (×5): qty 1

## 2015-10-27 MED ORDER — ATORVASTATIN CALCIUM 10 MG PO TABS
20.0000 mg | ORAL_TABLET | Freq: Every day | ORAL | Status: DC
  Administered 2015-10-27: 20 mg via ORAL
  Filled 2015-10-27: qty 2

## 2015-10-27 MED ORDER — PAR-4 STUDY - PLACEBO /  BMS986141 BOTTLE C OR BLISTER PACK
1.0000 | Freq: Every day | Status: DC
  Administered 2015-10-27 – 2015-10-29 (×3): 1 via ORAL
  Filled 2015-10-27 (×5): qty 1

## 2015-10-27 MED ORDER — PAR-4 STUDY - PLACEBO /  BMS986141 BOTTLE A OR BLISTER PACK
1.0000 | Freq: Every day | Status: DC
  Administered 2015-10-27 – 2015-10-29 (×3): 1 via ORAL
  Filled 2015-10-27 (×5): qty 1

## 2015-10-27 MED ORDER — PAR-4 STUDY - ASPIRIN
162.0000 mg | Freq: Every day | Status: DC
  Administered 2015-10-27 – 2015-10-29 (×3): 162 mg via ORAL
  Filled 2015-10-27 (×5): qty 2

## 2015-10-27 MED ORDER — PAR-4 STUDY - PLACEBO /  BMS986141 BOTTLE D OR BLISTER PACK
1.0000 | Freq: Every day | Status: DC
  Administered 2015-10-27 – 2015-10-29 (×3): 1 via ORAL
  Filled 2015-10-27 (×5): qty 1

## 2015-10-27 NOTE — Progress Notes (Signed)
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 There is question of slurred speech and told him he should come to checked out. On arrival, a code stroke was called and the CT head was performed which did show a right frontal infarct.He apparently has said some odd things such as giving the wrong winning team of a game he should have known about, as well as miss quoting his Estate manager/land agent. Patient was not administered TPA secondary to being out of IV TPA window on arrival. He was admitted for further evaluation and treatment.   SUBJECTIVE (INTERVAL HISTORY) Friends at the bedside exited when we arrived.  Overall he feels his condition is stable. He is a retired Education officer, community (retired 3 years ago, practiced x 35 years) and knowledgeable about current condition. I obtained an extensive history from the patient regarding onset of his symptoms yesterday morning at 8 AM   OBJECTIVE Temp:  [97.7 F (36.5 C)-98.5 F (36.9 C)] 98.4 F (36.9 C) (12/12 0400) Pulse Rate:  [58-85] 62 (12/12 0400) Cardiac Rhythm:  [-] Normal sinus rhythm (12/11 1900) Resp:  [12-24] 14 (12/12 0400) BP: (131-165)/(82-100) 136/82 mmHg (12/12 0400) SpO2:  [96 %-100 %] 96 % (12/12 0400) Weight:  [79.379 kg (175 lb)] 79.379 kg (175 lb) (12/11 1449)  CBC:  Recent Labs Lab 10/26/15 1517 10/26/15 1520  WBC 8.4  --   NEUTROABS 6.5  --   HGB 14.1 15.3  HCT 41.2 45.0  MCV 93.0  --   PLT 177  --     Basic Metabolic Panel:  Recent Labs Lab 10/26/15 1517 10/26/15 1520  NA 138 139  K 3.7 3.7  CL 102 98*  CO2 29  --   GLUCOSE 115* 109*  BUN 19 24*  CREATININE 1.45* 1.40*  CALCIUM 9.4  --     Lipid Panel:    Component Value Date/Time   CHOL 206* 10/27/2015 0317   TRIG 119  10/27/2015 0317   HDL 40* 10/27/2015 0317   CHOLHDL 5.2 10/27/2015 0317   VLDL 24 10/27/2015 0317   LDLCALC 142* 10/27/2015 0317   HgbA1c: No results found for: HGBA1C Urine Drug Screen:    Component Value Date/Time   LABOPIA NONE DETECTED 10/26/2015 1640   COCAINSCRNUR NONE DETECTED 10/26/2015 1640   LABBENZ NONE DETECTED 10/26/2015 1640   AMPHETMU NONE DETECTED 10/26/2015 1640   THCU NONE DETECTED 10/26/2015 1640   LABBARB NONE DETECTED 10/26/2015 1640      IMAGING  Dg Chest 2 View 10/26/2015    No active cardiopulmonary disease.   Ct Head Wo Contrast 10/26/2015  Small right anterior frontal hypodensity compatible with acute infarction, no associated hemorrhage or significant mass effect.   MRI Head, MRA Head & Neck 10/26/2015    1. Acute nonhemorrhagic infarct involving the right frontal operculum. 2. Focal high-grade stenosis in the anterior right frontal operculum branch vessel of the right MCA. 3. No other significant proximal stenosis, aneurysm, or branch vessel occlusion within the circle of Willis. 4. No significant stenosis in the neck. 5. 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    PHYSICAL EXAM Pleasant middle-age Caucasian male not in distress. . Afebrile. Head is nontraumatic. Neck  is supple without bruit.    Cardiac exam no murmur or gallop. Lungs are clear to auscultation. Distal pulses are well felt. Neurological Exam :  Awake alert oriented x 3 normal speech and language. Mild left lower face asymmetry. Tongue midline. No drift. Mild diminished fine finger movements on left. Orbits right over left upper extremity. Mild left grip weak.. Normal sensation . Normal coordination. NIHSS 1 ( left lower face weakness only ) MRS premorbid 0. ASSESSMENT/PLAN [name] is a 64 y.o. male with history of HTN presenting with slurred speech and left facial weakness. He did not receive IV t-PA due to delay in arrival.   Stroke:  right  frontal opercular infarct embolic secondary to unknown source  Resultant  Neuro deficits resolved  MRI  R frontal operculum infarct  MRA  R fronal operculum branch or R MCA focal high-grade stenosis  MRA neck Unremarkable   2D Echo  pending   TEE to look for embolic source. Arranged with [facility] for tomorrow.  If positive for PFO (patent foramen ovale), check bilateral lower extremity venous dopplers to rule out DVT as possible source of stroke. (I have made patient NPO after midnight tonight).   If TEE negative, a [facility] electrophysiologist will consult and consider placement of an implantable loop recorder to evaluate for atrial fibrillation as etiology of stroke. This has been explained to patient by Dr. Pearlean BrownieSethi and he is agreeable.   LDL 142  HgbA1c pending  Lovenox 30 mg sq daily for VTE prophylaxis  Diet regular Room service appropriate?: Yes; Fluid consistency:: Thin  aspirin 81 mg daily prior to admission, increased to   aspirin 325 mg daily   Patient interested in participating in the PARFAIT trial. [facility] will follow up for possible enrollment. Please contact [facility] at [phone] for any questions.   Patient counseled to be compliant with his antithrombotic medications  Ongoing aggressive stroke risk factor management  Therapy recommendations:  pending   Disposition:  Pending. Anticipate return home  Hypertension  Home meds: lisinopril, HCTZ  elevated on admission to 165/100   Stable now  Permissive hypertension (OK if < 220/120) but gradually normalize in 5-7 days  Hyperlipidemia  Home meds:  No statin  LDL 142, goal < 70  Recommend the addition of statin   Continue statin at discharge  Hospital day # 1  Rhoderick MoodyBIBY,SHARON  [facility] See Amion for Pager information 10/27/2015 10:55 AM  I have personally examined this  patient, reviewed notes, independently viewed imaging studies, participated in medical decision making and plan of care. I have made any additions or clarifications directly to the above note. Agree with note above.  He presented with speech difficulty and left facial weakness secondary to a right MCA branch infarct likely embolic etiology which is to be determined. He will need TEE and loop recorder. He remains at risk for neurological worsening, recurrent stroke, TIA and needs ongoing stroke evaluation and aggressive risk factor control. I had a long discussion with the patient, his wife and daughter-in-law regarding his stroke, plan for further evaluation, treatment and also discuss possible interest in participation in the Parfait  stroke trial. He will be given information to review and decide  Delia HeadyPramod Maha Fischel, MD Medical Director [facility] Pager: [phone] 10/27/2015 4:38 PM    To contact Stroke Continuity provider, please refer to [email or web address]. After hours, contact General Neurology

## 2015-10-27 NOTE — Progress Notes (Signed)
Echocardiogram 2D Echocardiogram has been performed.  Nolon RodBrown, Tony 10/27/2015, 4:43 PM

## 2015-10-27 NOTE — Progress Notes (Signed)
    [facility] has been requested to perform a transesophageal echocardiogram for stroke eval.  After careful review of history and examination, the risks and benefits of transesophageal echocardiogram have been explained including risks of esophageal damage, perforation (1:10,000 risk), bleeding, pharyngeal hematoma as well as other potential complications associated with conscious sedation including aspiration, arrhythmia, respiratory failure and death. Alternatives to treatment were discussed, questions were answered. Patient is willing to proceed.  TEE - Dr. Shirlee LatchMcLean @ 1600 . NPO after midnight. Meds with sips.   Michoel Kunin, PA-C 10/27/2015 7:07 PM

## 2015-10-27 NOTE — Progress Notes (Signed)
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 MRI -acute nonhemorrhagic infarct within the right frontal operculum   Clinical Impression   Pt appears at baseline. Pt expresses concern over "why" he had a CVA and what he can do to prevent another CVA. Discussed warning signs/symptoms of CVA using "FAST". Pt verbalized understanding. No OT needs. Pt safe to ambulate in room and ready to D/C when medically stable. OT signing off.     Follow Up Recommendations  No OT follow up    Equipment Recommendations  None recommended by OT    Recommendations for Other Services       Precautions / Restrictions Precautions Precautions: Fall Precaution Comments: x3 surgeries in 9 weeks on R shoulder.  Restrictions Weight Bearing Restrictions: No      Mobility Bed Mobility Overal bed mobility: Independent                Transfers Overall transfer level: Independent Equipment used: None                  Balance Overall balance assessment: No apparent balance deficits (not formally assessed)                           High level balance activites: Braiding;Direction changes;Head turns;Turns;Other (comment) (High steps over objects)              ADL Overall ADL's : Independent                                             Vision Vision Assessment?: Yes Eye Alignment: Within Functional Limits Ocular Range of Motion: Within Functional Limits Alignment/Gaze Preference: Within Defined Limits Tracking/Visual Pursuits: Able to track stimulus in all quads without difficulty Saccades: Within functional limits Convergence: Within functional limits Visual Fields: No apparent deficits   Perception     Praxis Praxis Praxis tested?: Within functional limits     Pertinent Vitals/Pain Pain Assessment: No/denies pain     Hand Dominance Right   Extremity/Trunk Assessment Upper Extremity Assessment Upper Extremity Assessment: RUE deficits/detail RUE Deficits / Details: Deficits related to R shoulder RTC and AC   Lower Extremity Assessment Lower Extremity Assessment: Overall WFL for tasks assessed   Cervical / Trunk Assessment Cervical / Trunk Assessment: Normal   Communication Communication Communication: No difficulties   Cognition Arousal/Alertness: Awake/alert Behavior During Therapy: WFL for tasks assessed/performed Overall Cognitive Status: Within Functional Limits for tasks assessed  Pt discussing situation of CVA and states people noticed changes in him. Pt states he was late for his meeting that day and states "I'm never late for anything. I left in plenty of time that morning but somehow I was late". It concerns me that I lost time and don't know what I did or why I was late". States I also "went over my allotted time to speak, and that is something I never do". Pt appears back to baseline at this time. Discussed cognitive changes sometimes associated with frontal CVAs and recommended for pt to discuss any concerns with his MD if he notices any difficulties. Pt verbalized understanding.  General Comments       Exercises       Shoulder Instructions      Home Living Family/patient expects to be discharged to:: Private residence Living Arrangements: Spouse/significant other Available Help at Discharge: Family;Available 24 hours/day Type of Home: House Home Access: Level entry     Home Layout: Two level     Bathroom Shower/Tub: Producer, television/film/video: Standard Bathroom Accessibility: Yes   Home Equipment: Emergency planning/management officer - 2 wheels          Prior Functioning/Environment Level of Independence: Independent             OT Diagnosis: Generalized weakness   OT Problem  List: Impaired UE functional use (from previous surgery)   OT Treatment/Interventions:      OT Goals(Current goals can be found in the care plan section) Acute Rehab OT Goals Patient Stated Goal: to find out why I had a stroke OT Goal Formulation: All assessment and education complete, DC therapy  OT Frequency:     Barriers to D/C:            Co-evaluation              End of Session Nurse Communication: Mobility status  Activity Tolerance: Patient tolerated treatment well Patient left: in bed;with call bell/phone within reach;with family/visitor present   Time: 1010-1025 OT Time Calculation (min): 15 min Charges:  OT General Charges $OT Visit: 1 Procedure OT Evaluation $Initial OT Evaluation Tier I: 1 Procedure G-Codes: OT G-codes **NOT FOR INPATIENT CLASS** Functional Assessment Tool Used: clinical judgement Functional Limitation: Self care Self Care Current Status (H0865): 0 percent impaired, limited or restricted Self Care Goal Status (H8469): 0 percent impaired, limited or restricted Self Care Discharge Status (G2952): 0 percent impaired, limited or restricted  Ether Goebel,HILLARY 10/27/2015, 10:33 AM   [facility], OTR/L  [phone] 10/27/2015

## 2015-10-27 NOTE — Progress Notes (Signed)
Family Medicine Teaching Service Daily Progress Note Intern Pager: [phone]  Patient name: Fred Russell Medical record number: [id number] Date of birth: 06-09-1951 Age: 64 y.o. Gender: male  Primary Care Provider: Milana Obey, MD Consultants: Neuro Code Status: DNR  Pt Overview and Major Events to Date:   Assessment and Plan: Fred Russell is a 64 y.o. male presenting with aphasia and left facial droop. PMH is significant for HTN and repeat shoulder surgeries.  #CVA: No longer symptomatic. Found to have aphasia, facial droop, and altered mental status this morning at church. CT head showed right frontal opercular infarct, concerning for embolus 2/2 unknown source. - Neuro consulted, appreciate recs: obtain TEE and loop recorder 10/28/15 to look for embolic source.  - A1c pending - TSH nml at 2.885 - Lipid panel with total cholesterol 206, HDL 40, LDL 142. - ASCVD risk 16.3%. Will start lipitor 20 mg daily. 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  - MRA of the neck with contrast showed no significant stenosis.   #HTN: Bps elevated in ED most likely 2/2 stroke. BPs now 130s/80s.  - Continue to hold home lisinopril - Restarted HCTZ in AM  FEN/GI: NPO at midnight for TEE; saline lock IV Prophylaxis: Lovenox  Disposition: Admitted to med-surg for stroke work-up.  Subjective:  - Patient no longer notices any deficits. Says facial droop resolved shortly after admission. - Patient eager to understand why he had the stroke and has been speaking with his PCP, as well.  Objective: Temp:  [97.7 F (36.5 C)-98.5 F (36.9 C)] 98.4 F (36.9 C) (12/12 0400) Pulse Rate:  [58-85] 62 (12/12 0400) Resp:  [12-24] 14  (12/12 0400) BP: (131-165)/(82-100) 136/82 mmHg (12/12 0400) SpO2:  [96 %-100 %] 96 % (12/12 0400) Weight:  [175 lb (79.379 kg)] 175 lb (79.379 kg) (12/11 1449) Physical Exam: General: Athletic man, sitting at edge of bed ready to participate in OT Cardiovascular: RRR, S1, S2, no m/r/g Respiratory: CTAB, no increased WOB Extremities: FROM Neuro: No aphasia. CN II-XII intact, Strength 5/5 globally, AOx4. Psych: Mood and affect normal, pleasant and cooperative  Laboratory:  Recent Labs Lab 10/26/15 1517 10/26/15 1520  WBC 8.4  --   HGB 14.1 15.3  HCT 41.2 45.0  PLT 177  --     Recent Labs Lab 10/26/15 1517 10/26/15 1520  NA 138 139  K 3.7 3.7  CL 102 98*  CO2 29  --   BUN 19 24*  CREATININE 1.45* 1.40*  CALCIUM 9.4  --   PROT 6.5  --   BILITOT 0.9  --   ALKPHOS 45  --   ALT 18  --   AST 23  --   GLUCOSE 115* 109*    Imaging/Diagnostic Tests: Dg Chest 2 View  10/26/2015  CLINICAL DATA:  Stroke with history of hypertension EXAM: CHEST  2 VIEW COMPARISON:  None. FINDINGS: Significant sigmoid scoliotic curvature of the thoracolumbar spine. Heart size within normal limits as is pulmonary vascular pattern. No evidence of consolidation or effusion. IMPRESSION: No active cardiopulmonary disease. Electronically Signed   By: Esperanza Heir M.D.   On: 10/26/2015 19:26   Ct Head Wo Contrast  10/26/2015  CLINICAL DATA:  Acute stroke symptoms, left facial droop, confusion, ectasia  complicated stroke EXAM: CT HEAD WITHOUT CONTRAST TECHNIQUE: Contiguous axial images were obtained from the base of the skull through the vertex without contrast. COMPARISON:  None available FINDINGS: Small right frontal ill-defined hypodensity with loss of gray-white differentiation, images 13-17, compatible with a small acute stroke involving the anterior MCA territory. No associated intracranial hemorrhage, significant mass effect, midline shift, herniation or hydrocephalus. Symmetric midline  ventricles. Cisterns are patent. No cerebellar abnormality. Atherosclerosis of the intracranial vessels of the skullbase. Orbits are symmetric. Mastoids and sinuses remain clear. No skull abnormality. IMPRESSION: Small right anterior frontal hypodensity compatible with acute infarction, no associated hemorrhage or significant mass effect. These results were called by telephone at the time of interpretation on 10/26/2015 at 3:15 pm to Dr. Amada Jupiter, who verbally acknowledged these results. Electronically Signed   By: Judie Petit.  Shick M.D.   On: 10/26/2015 15:16   Mr Maxine Glenn Head Wo Contrast  10/26/2015  CLINICAL DATA:  Altered mental status. Slurred speech. Abnormal CT scan. EXAM: MRI HEAD WITHOUT AND WITH CONTRAST MRA HEAD WITHOUT CONTRAST MRA NECK WITHOUT AND WITH CONTRAST TECHNIQUE: Multiplanar, multiecho pulse sequences of the brain and surrounding structures were obtained without and with intravenous contrast. Angiographic images of the Circle of Willis were obtained using MRA technique without intravenous contrast. Angiographic images of the neck were obtained using MRA technique without and with intravenous contrast. Carotid stenosis measurements (when applicable) are obtained utilizing NASCET criteria, using the distal internal carotid diameter as the denominator. CONTRAST:  17mL MULTIHANCE GADOBENATE DIMEGLUMINE 529 MG/ML IV SOLN COMPARISON:  CT head without contrast from the same day. FINDINGS: MRI HEAD FINDINGS An acute nonhemorrhagic infarct within the right frontal operculum is confirmed. Cortical T2 changes are associated with the area of acute/subacute infarct. Mild subcortical T2 changes on the left are somewhat advanced for age. Minimal periventricular changes are present. There is focal ossification within the anterior falx. The postcontrast images demonstrate no pathologic enhancement. Flow is present in the major intracranial arteries. The globes and orbits are intact. Polyps or mucous retention cysts  are noted in the left maxillary sinus inferiorly. The paranasal sinuses are otherwise clear. MRA HEAD FINDINGS Mild tortuosity in the high cervical internal carotid arteries is advanced for age. There is no associated stenosis. The internal carotid arteries are otherwise within normal limits to the ICA termini bilaterally. The A1 and M1 segments are normal. The anterior communicating artery is patent. There is focal attenuation in stenosis within the anterior operculum ir right MCA branch. The ACA and MCA branch vessels are otherwise intact. The left vertebral artery is the dominant. A hypoplastic right vertebral artery bifurcates at the PICA, sending only a very small branch to the basilar artery. The basilar artery is otherwise within normal limits. Both posterior cerebral arteries originate from the basilar tip. The PCA branch vessels are intact. MRA NECK FINDINGS The time-of-flight images demonstrate no significant flow disturbance at either carotid bifurcation. Flow is antegrade in the vertebral arteries bilaterally. A 3 vessel arch configuration is present. The right common carotid artery is within normal limits. The bifurcation is unremarkable. The cervical right ICA is tortuous just below the skullbase. There is no associated stenosis. The left common carotid artery is within normal limits. The carotid bifurcation is unremarkable. There is some tortuosity of the distal cervical ICA just below the skullbase. There is no associated stenosis. Both vertebral arteries originate from the subclavian arteries. The left vertebral artery is dominant. The right vertebral artery is hypoplastic, bifurcating at the PICA. IMPRESSION:  1. Acute nonhemorrhagic infarct involving the right frontal operculum. 2. Focal high-grade stenosis in the anterior right frontal operculum branch vessel of the right MCA. 3. No other significant proximal stenosis, aneurysm, or branch vessel occlusion within the circle of Willis. 4. No  significant stenosis in the neck. 5. 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 Electronically Signed   By: Marin Robertshristopher  Mattern M.D.   On: 10/26/2015 20:48   Mr Angiogram Neck W Wo Contrast  10/26/2015  CLINICAL DATA:  Altered mental status. Slurred speech. Abnormal CT scan. EXAM: MRI HEAD WITHOUT AND WITH CONTRAST MRA HEAD WITHOUT CONTRAST MRA NECK WITHOUT AND WITH CONTRAST TECHNIQUE: Multiplanar, multiecho pulse sequences of the brain and surrounding structures were obtained without and with intravenous contrast. Angiographic images of the Circle of Willis were obtained using MRA technique without intravenous contrast. Angiographic images of the neck were obtained using MRA technique without and with intravenous contrast. Carotid stenosis measurements (when applicable) are obtained utilizing NASCET criteria, using the distal internal carotid diameter as the denominator. CONTRAST:  17mL MULTIHANCE GADOBENATE DIMEGLUMINE 529 MG/ML IV SOLN COMPARISON:  CT head without contrast from the same day. FINDINGS: MRI HEAD FINDINGS An acute nonhemorrhagic infarct within the right frontal operculum is confirmed. Cortical T2 changes are associated with the area of acute/subacute infarct. Mild subcortical T2 changes on the left are somewhat advanced for age. Minimal periventricular changes are present. There is focal ossification within the anterior falx. The postcontrast images demonstrate no pathologic enhancement. Flow is present in the major intracranial arteries. The globes and orbits are intact. Polyps or mucous retention cysts are noted in the left maxillary sinus inferiorly. The paranasal sinuses are otherwise clear. MRA HEAD FINDINGS Mild tortuosity in the high cervical internal carotid arteries is advanced for age. There is no associated stenosis. The internal carotid arteries are otherwise within normal limits to the ICA termini bilaterally. The A1 and M1 segments are normal. The  anterior communicating artery is patent. There is focal attenuation in stenosis within the anterior operculum ir right MCA branch. The ACA and MCA branch vessels are otherwise intact. The left vertebral artery is the dominant. A hypoplastic right vertebral artery bifurcates at the PICA, sending only a very small branch to the basilar artery. The basilar artery is otherwise within normal limits. Both posterior cerebral arteries originate from the basilar tip. The PCA branch vessels are intact. MRA NECK FINDINGS The time-of-flight images demonstrate no significant flow disturbance at either carotid bifurcation. Flow is antegrade in the vertebral arteries bilaterally. A 3 vessel arch configuration is present. The right common carotid artery is within normal limits. The bifurcation is unremarkable. The cervical right ICA is tortuous just below the skullbase. There is no associated stenosis. The left common carotid artery is within normal limits. The carotid bifurcation is unremarkable. There is some tortuosity of the distal cervical ICA just below the skullbase. There is no associated stenosis. Both vertebral arteries originate from the subclavian arteries. The left vertebral artery is dominant. The right vertebral artery is hypoplastic, bifurcating at the PICA. IMPRESSION: 1. Acute nonhemorrhagic infarct involving the right frontal operculum. 2. Focal high-grade stenosis in the anterior right frontal operculum branch vessel of the right MCA. 3. No other significant proximal stenosis, aneurysm, or branch vessel occlusion within the circle of Willis. 4. No significant stenosis in the neck. 5. 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 Electronically Signed   By: Marin Robertshristopher  Mattern M.D.   On: 10/26/2015 20:48  Mr Laqueta Jean Wo Contrast  10/26/2015  CLINICAL DATA:  Altered mental status. Slurred speech. Abnormal CT scan. EXAM: MRI HEAD WITHOUT AND WITH CONTRAST MRA HEAD WITHOUT CONTRAST MRA  NECK WITHOUT AND WITH CONTRAST TECHNIQUE: Multiplanar, multiecho pulse sequences of the brain and surrounding structures were obtained without and with intravenous contrast. Angiographic images of the Circle of Willis were obtained using MRA technique without intravenous contrast. Angiographic images of the neck were obtained using MRA technique without and with intravenous contrast. Carotid stenosis measurements (when applicable) are obtained utilizing NASCET criteria, using the distal internal carotid diameter as the denominator. CONTRAST:  17mL MULTIHANCE GADOBENATE DIMEGLUMINE 529 MG/ML IV SOLN COMPARISON:  CT head without contrast from the same day. FINDINGS: MRI HEAD FINDINGS An acute nonhemorrhagic infarct within the right frontal operculum is confirmed. Cortical T2 changes are associated with the area of acute/subacute infarct. Mild subcortical T2 changes on the left are somewhat advanced for age. Minimal periventricular changes are present. There is focal ossification within the anterior falx. The postcontrast images demonstrate no pathologic enhancement. Flow is present in the major intracranial arteries. The globes and orbits are intact. Polyps or mucous retention cysts are noted in the left maxillary sinus inferiorly. The paranasal sinuses are otherwise clear. MRA HEAD FINDINGS Mild tortuosity in the high cervical internal carotid arteries is advanced for age. There is no associated stenosis. The internal carotid arteries are otherwise within normal limits to the ICA termini bilaterally. The A1 and M1 segments are normal. The anterior communicating artery is patent. There is focal attenuation in stenosis within the anterior operculum ir right MCA branch. The ACA and MCA branch vessels are otherwise intact. The left vertebral artery is the dominant. A hypoplastic right vertebral artery bifurcates at the PICA, sending only a very small branch to the basilar artery. The basilar artery is otherwise within  normal limits. Both posterior cerebral arteries originate from the basilar tip. The PCA branch vessels are intact. MRA NECK FINDINGS The time-of-flight images demonstrate no significant flow disturbance at either carotid bifurcation. Flow is antegrade in the vertebral arteries bilaterally. A 3 vessel arch configuration is present. The right common carotid artery is within normal limits. The bifurcation is unremarkable. The cervical right ICA is tortuous just below the skullbase. There is no associated stenosis. The left common carotid artery is within normal limits. The carotid bifurcation is unremarkable. There is some tortuosity of the distal cervical ICA just below the skullbase. There is no associated stenosis. Both vertebral arteries originate from the subclavian arteries. The left vertebral artery is dominant. The right vertebral artery is hypoplastic, bifurcating at the PICA. IMPRESSION: 1. Acute nonhemorrhagic infarct involving the right frontal operculum. 2. Focal high-grade stenosis in the anterior right frontal operculum branch vessel of the right MCA. 3. No other significant proximal stenosis, aneurysm, or branch vessel occlusion within the circle of Willis. 4. No significant stenosis in the neck. 5. 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 Electronically Signed   By: Marin Roberts M.D.   On: 10/26/2015 20:48    Hillary Percell Boston, MD 10/27/2015, 8:48 AM PGY-1, [facility] FPTS Intern pager: [phone], text pages welcome

## 2015-10-27 NOTE — Evaluation (Signed)
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 Imaging revealed R frontal infarct. At the time of PT eval symptoms had resolved.   Clinical Impression  Patient evaluated by Physical Therapy with no further acute PT needs identified. All education has been completed and the patient has no further questions. At the time of PT eval pt was able to negotiate all mobility with independence, and strength 5/5 bilaterally. Pt reports he is at baseline of function. See below for any follow-up Physical Therapy or equipment needs. PT is signing off. Thank you for this referral.     Follow Up Recommendations No PT follow up;Supervision - Intermittent    Equipment Recommendations  None recommended by PT    Recommendations for Other Services       Precautions / Restrictions Precautions Precautions: Fall Precaution Comments: x3 surgeries in 9 weeks on R shoulder.  Restrictions Weight Bearing Restrictions: No      Mobility  Bed Mobility Overal bed mobility: Independent                Transfers Overall transfer level: Independent Equipment used: None                Ambulation/Gait Ambulation/Gait assistance: Independent Ambulation Distance (Feet): 500 Feet Assistive device: None Gait Pattern/deviations: WFL(Within Functional Limits)   Gait velocity interpretation: at or above normal speed for age/gender    Stairs Stairs: Yes Stairs assistance: Independent Stair Management: No rails (Light R rail use occasionally) Number of Stairs: 10    Wheelchair Mobility    Modified Rankin (Stroke Patients Only)       Balance Overall balance assessment: Modified Independent                           High level balance activites: Braiding;Direction changes;Head  turns;Turns;Other (comment) (High steps over objects)               Pertinent Vitals/Pain Pain Assessment: No/denies pain    Home Living Family/patient expects to be discharged to:: Private residence Living Arrangements: Spouse/significant other Available Help at Discharge: Family;Available 24 hours/day Type of Home: House Home Access: Level entry     Home Layout: Two level Home Equipment: Emergency planning/management officer - 2 wheels      Prior Function Level of Independence: Independent               Hand Dominance        Extremity/Trunk Assessment   Upper Extremity Assessment: Overall WFL for tasks assessed;RUE deficits/detail RUE Deficits / Details: Decreased strength and AROM consistent with prior rotator cuff repairs and subsequent frozen shoulder. Bruising noted on R bicep pt states has been present since manipulation.          Lower Extremity Assessment: Overall WFL for tasks assessed (Strength 5/5 bilaterally. )      Cervical / Trunk Assessment: Normal  Communication   Communication: No difficulties  Cognition Arousal/Alertness: Awake/alert Behavior During Therapy: WFL for tasks assessed/performed Overall Cognitive Status: Within Functional Limits for tasks assessed                      General Comments      Exercises        Assessment/Plan    PT Assessment Patent does not need any further  PT services  PT Diagnosis Difficulty walking   PT Problem List    PT Treatment Interventions     PT Goals (Current goals can be found in the Care Plan section) Acute Rehab PT Goals PT Goal Formulation: All assessment and education complete, DC therapy    Frequency     Barriers to discharge        Co-evaluation               End of Session   Activity Tolerance: Patient tolerated treatment well Patient left: with call bell/phone within reach (Sitting EOB with MD presents in room) Nurse Communication: Mobility status    Functional  Assessment Tool Used: Clinical judgement Functional Limitation: Mobility: Walking and moving around Mobility: Walking and Moving Around Current Status ([id number](G8978): 0 percent impaired, limited or restricted Mobility: Walking and Moving Around Goal Status [phone](G8979): 0 percent impaired, limited or restricted Mobility: Walking and Moving Around Discharge Status [phone](G8980): 0 percent impaired, limited or restricted    Time: 0747-0805 PT Time Calculation (min) (ACUTE ONLY): 18 min   Charges:   PT Evaluation $Initial PT Evaluation Tier I: 1 Procedure     PT G Codes:   PT G-Codes **NOT FOR INPATIENT CLASS** Functional Assessment Tool Used: Clinical judgement Functional Limitation: Mobility: Walking and moving around Mobility: Walking and Moving Around Current Status ([id number](G8978): 0 percent impaired, limited or restricted Mobility: Walking and Moving Around Goal Status ([id number](G8979): 0 percent impaired, limited or restricted Mobility: Walking and Moving Around Discharge Status ([phone](G8980): 0 percent impaired, limited or restricted    Conni SlipperKirkman, Erle Guster 10/27/2015, 8:13 AM  Conni SlipperLaura Ellwood Steidle, PT, DPT Acute Rehabilitation Services Pager: [phone]

## 2015-10-27 NOTE — Progress Notes (Signed)
For [phone] assessment

## 2015-10-28 ENCOUNTER — Encounter (HOSPITAL_COMMUNITY): Payer: Self-pay

## 2015-10-28 ENCOUNTER — Encounter (HOSPITAL_COMMUNITY)
Admission: EM | Disposition: A | Discharge status: Home / Self Care | Payer: Self-pay | Source: Home / Self Care | Attending: Family Medicine

## 2015-10-28 ENCOUNTER — Inpatient Hospital Stay (HOSPITAL_COMMUNITY): Payer: BLUE CROSS/BLUE SHIELD

## 2015-10-28 DIAGNOSIS — I340 Nonrheumatic mitral (valve) insufficiency: Secondary | ICD-10-CM

## 2015-10-28 DIAGNOSIS — I639 Cerebral infarction, unspecified: Secondary | ICD-10-CM

## 2015-10-28 DIAGNOSIS — Q211 Atrial septal defect: Secondary | ICD-10-CM

## 2015-10-28 HISTORY — PX: TEE WITHOUT CARDIOVERSION: SHX5443

## 2015-10-28 HISTORY — PX: EP IMPLANTABLE DEVICE: SHX172B

## 2015-10-28 LAB — BASIC METABOLIC PANEL
Anion gap: 8 (ref 5–15)
BUN: 14 mg/dL (ref 6–20)
CO2: 28 mmol/L (ref 22–32)
Calcium: 9 mg/dL (ref 8.9–10.3)
Chloride: 102 mmol/L (ref 101–111)
Creatinine, Ser: 1 mg/dL (ref 0.61–1.24)
GFR calc Af Amer: >60 mL/min (ref >60)
GLUCOSE: 101 mg/dL — AB (ref 65–99)
POTASSIUM: 3.6 mmol/L (ref 3.5–5.1)
Sodium: 138 mmol/L (ref 135–145)

## 2015-10-28 LAB — HEMOGLOBIN A1C
Hgb A1c MFr Bld: 5.4 % (ref 4.8–5.6)
MEAN PLASMA GLUCOSE: 108 mg/dL

## 2015-10-28 SURGERY — ECHOCARDIOGRAM, TRANSESOPHAGEAL
Anesthesia: Moderate Sedation

## 2015-10-28 SURGERY — LOOP RECORDER INSERTION

## 2015-10-28 MED ORDER — LIDOCAINE-EPINEPHRINE 1 %-1:100000 IJ SOLN
INTRAMUSCULAR | Status: DC | PRN
  Administered 2015-10-28: 10 mL via INTRADERMAL

## 2015-10-28 MED ORDER — FENTANYL CITRATE (PF) 100 MCG/2ML IJ SOLN
INTRAMUSCULAR | Status: DC | PRN
  Administered 2015-10-28 (×2): 25 ug via INTRAVENOUS

## 2015-10-28 MED ORDER — SODIUM CHLORIDE 0.9 % IV SOLN: INTRAVENOUS | Status: DC

## 2015-10-28 MED ORDER — MIDAZOLAM HCL 5 MG/ML IJ SOLN
INTRAMUSCULAR | Status: AC
  Filled 2015-10-28: qty 2

## 2015-10-28 MED ORDER — ATORVASTATIN CALCIUM 40 MG PO TABS: 40.0000 mg | ORAL_TABLET | Freq: Every day | ORAL | Status: DC

## 2015-10-28 MED ORDER — LIDOCAINE-EPINEPHRINE 1 %-1:100000 IJ SOLN
INTRAMUSCULAR | Status: AC
  Filled 2015-10-28: qty 1

## 2015-10-28 MED ORDER — FENTANYL CITRATE (PF) 100 MCG/2ML IJ SOLN
INTRAMUSCULAR | Status: AC
  Filled 2015-10-28: qty 2

## 2015-10-28 MED ORDER — BUTAMBEN-TETRACAINE-BENZOCAINE 2-2-14 % EX AERO
INHALATION_SPRAY | CUTANEOUS | Status: DC | PRN
Start: 2015-10-28 — End: 2015-10-28
  Administered 2015-10-28: 2 via TOPICAL

## 2015-10-28 MED ORDER — MIDAZOLAM HCL 10 MG/2ML IJ SOLN
INTRAMUSCULAR | Status: DC | PRN
  Administered 2015-10-28 (×2): 2 mg via INTRAVENOUS
  Administered 2015-10-28: 1 mg via INTRAVENOUS

## 2015-10-28 SURGICAL SUPPLY — 2 items
LOOP REVEAL LINQSYS (Prosthesis & Implant Heart) ×2 IMPLANT
PACK LOOP INSERTION (CUSTOM PROCEDURE TRAY) ×3 IMPLANT

## 2015-10-28 NOTE — Progress Notes (Signed)
Pt had his loop recorder placed and pt said Dr. Pearlean BrownieSethi and the cardiologist told him he could go after the procedure.  Pt wants to go home tonight.  MD notified that pt wants to leave tonight, MD said they would d/c pt tonight.  Will continue to monitor.  Estanislado EmmsAshley Schwarz, RN

## 2015-10-28 NOTE — Consult Note (Signed)
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 Imaging demonstrated right frontal opercular infarct.  He has undergone workup for stroke including echocardiogram and carotid dopplers.  The patient has been monitored on telemetry which has demonstrated sinus rhythm with no arrhythmias.  Inpatient stroke work-up is to be completed with a TEE.   Echocardiogram this admission demonstrated EF 55-60%, grade 1 diastolic dysfunction, mildly dilated aortic root, LA 34.     Prior to admission, the patient denies chest pain, shortness of breath, dizziness, palpitations, or syncope.  They are recovering from their stroke with plans to return home at discharge.  EP has been asked to evaluate for placement of an implantable loop recorder to monitor for atrial fibrillation.   Past Medical History  Diagnosis Date  . Hypertension   . Left foot drop 08/13/2015     Surgical History:  Past Surgical History  Procedure Laterality Date  . Rotator cuff repair      x3     Prescriptions prior to admission  Medication Sig Dispense Refill Last Dose  . aspirin EC 81 MG tablet Take 81 mg by mouth daily.   10/26/2015 at Unknown time  . hydrochlorothiazide (HYDRODIURIL) 25 MG tablet Take 25 mg by mouth daily.   10/26/2015 at Unknown time  . lisinopril (PRINIVIL,ZESTRIL) 10 MG tablet Take 10 mg by mouth daily.   10/26/2015 at Unknown time  . naproxen (NAPROSYN) 500 MG tablet Take 500 mg by mouth 2 (two) times daily.  0 10/25/2015 at Unknown time    Inpatient Medications:  . aspirin  300 mg Rectal Daily  .  atorvastatin  20 mg Oral q1800  . hydrochlorothiazide  25 mg Oral Daily  . PAR-4 Study - aspirin  162 mg Oral Q0600  . PAR-4 Study - placebo /  [id number] - Bottle A  1 tablet Oral Q0600  . PAR-4 Study - placebo /  [id number] - Bottle B  1 tablet Oral Q0600  . PAR-4 Study - placebo /  [id number] - Bottle C  1 tablet Oral Q0600  . PAR-4 Study - placebo /  [id number] - Bottle D  1 tablet Oral Q0600    Allergies: No Known Allergies  Social History   Social History  . Marital Status: Married    Spouse Name: N/A  . Number of Children: 2  . Years of Education: DDS   Occupational History  . retired    Social History Main Topics  . Smoking status: Never Smoker   . Smokeless tobacco: Never Used  . Alcohol Use: No  . Drug Use: No  . Sexual Activity: Not on file   Other Topics Concern  . Not on file   Social History Narrative   Patient drinks 2 cups of coffee daily.   Patient is right handed.      Family History  Problem Relation Age of Onset  . COPD Mother   . Heart failure Mother   . Skin cancer Mother   . ALS Father       Review of Systems: All other systems reviewed and are otherwise negative except as noted above.  Physical Exam: Filed Vitals:   10/27/15  1839 10/27/15 2134 10/28/15 0128 10/28/15 0554  BP: 138/87 155/88 120/76 131/71  Pulse: 64 57 56 57  Temp:  98.9 F (37.2 C) 98.5 F (36.9 C) 98.6 F (37 C)  TempSrc: Oral Oral Oral Oral  Resp: 20 20 18 18   Height:      Weight:      SpO2: 96% 96% 96% 95%    GEN- The patient is well appearing, alert and oriented x 3 today.   Head- normocephalic, atraumatic Eyes-  Sclera clear, conjunctiva pink Ears- hearing intact Oropharynx- clear Neck- supple Lungs- Clear to ausculation bilaterally, normal work of breathing Heart- Regular rate and rhythm, no murmurs, rubs or gallops  GI- soft, NT, ND, + BS Extremities- no clubbing, cyanosis, or edema MS- no significant deformity or atrophy Skin- no rash or  lesion Psych- euthymic mood, full affect   Labs:   Lab Results  Component Value Date   WBC 8.4 10/26/2015   HGB 15.3 10/26/2015   HCT 45.0 10/26/2015   MCV 93.0 10/26/2015   PLT 177 10/26/2015    Recent Labs Lab 10/26/15 1517  10/27/15 1113  NA 138  < > 140  K 3.7  < > 3.7  CL 102  < > 103  CO2 29  --  31  BUN 19  < > 17  CREATININE 1.45*  < > 1.00  CALCIUM 9.4  --  9.6  PROT 6.5  --   --   BILITOT 0.9  --   --   ALKPHOS 45  --   --   ALT 18  --   --   AST 23  --   --   GLUCOSE 115*  < > 97  < > = values in this interval not displayed. No results found for: CKTOTAL, CKMB, CKMBINDEX, TROPONINI   Radiology/Studies: Dg Chest 2 View 10/26/2015  CLINICAL DATA:  Stroke with history of hypertension EXAM: CHEST  2 VIEW COMPARISON:  None. FINDINGS: Significant sigmoid scoliotic curvature of the thoracolumbar spine. Heart size within normal limits as is pulmonary vascular pattern. No evidence of consolidation or effusion. IMPRESSION: No active cardiopulmonary disease. Electronically Signed   By: Esperanza Heir M.D.   On: 10/26/2015 19:26   Ct Head Wo Contrast 10/26/2015  CLINICAL DATA:  Acute stroke symptoms, left facial droop, confusion, ectasia complicated stroke EXAM: CT HEAD WITHOUT CONTRAST TECHNIQUE: Contiguous axial images were obtained from the base of the skull through the vertex without contrast. COMPARISON:  None available FINDINGS: Small right frontal ill-defined hypodensity with loss of gray-white differentiation, images 13-17, compatible with a small acute stroke involving the anterior MCA territory. No associated intracranial hemorrhage, significant mass effect, midline shift, herniation or hydrocephalus. Symmetric midline ventricles. Cisterns are patent. No cerebellar abnormality. Atherosclerosis of the intracranial vessels of the skullbase. Orbits are symmetric. Mastoids and sinuses remain clear. No skull abnormality. IMPRESSION: Small right anterior frontal hypodensity  compatible with acute infarction, no associated hemorrhage or significant mass effect. These results were called by telephone at the time of interpretation on 10/26/2015 at 3:15 pm to Dr. Amada Jupiter, who verbally acknowledged these results. Electronically Signed   By: Judie Petit.  Shick M.D.   On: 10/26/2015 15:16   Mr Maxine Glenn Head Wo Contrast 10/26/2015  CLINICAL DATA:  Altered mental status. Slurred speech. Abnormal CT scan. EXAM: MRI HEAD WITHOUT AND WITH CONTRAST MRA HEAD WITHOUT CONTRAST MRA NECK WITHOUT AND WITH CONTRAST TECHNIQUE: Multiplanar, multiecho pulse sequences of the brain and surrounding structures were obtained without and  with intravenous contrast. Angiographic images of the Circle of Willis were obtained using MRA technique without intravenous contrast. Angiographic images of the neck were obtained using MRA technique without and with intravenous contrast. Carotid stenosis measurements (when applicable) are obtained utilizing NASCET criteria, using the distal internal carotid diameter as the denominator. CONTRAST:  17mL MULTIHANCE GADOBENATE DIMEGLUMINE 529 MG/ML IV SOLN COMPARISON:  CT head without contrast from the same day. FINDINGS: MRI HEAD FINDINGS An acute nonhemorrhagic infarct within the right frontal operculum is confirmed. Cortical T2 changes are associated with the area of acute/subacute infarct. Mild subcortical T2 changes on the left are somewhat advanced for age. Minimal periventricular changes are present. There is focal ossification within the anterior falx. The postcontrast images demonstrate no pathologic enhancement. Flow is present in the major intracranial arteries. The globes and orbits are intact. Polyps or mucous retention cysts are noted in the left maxillary sinus inferiorly. The paranasal sinuses are otherwise clear. MRA HEAD FINDINGS Mild tortuosity in the high cervical internal carotid arteries is advanced for age. There is no associated stenosis. The internal carotid arteries  are otherwise within normal limits to the ICA termini bilaterally. The A1 and M1 segments are normal. The anterior communicating artery is patent. There is focal attenuation in stenosis within the anterior operculum ir right MCA branch. The ACA and MCA branch vessels are otherwise intact. The left vertebral artery is the dominant. A hypoplastic right vertebral artery bifurcates at the PICA, sending only a very small branch to the basilar artery. The basilar artery is otherwise within normal limits. Both posterior cerebral arteries originate from the basilar tip. The PCA branch vessels are intact. MRA NECK FINDINGS The time-of-flight images demonstrate no significant flow disturbance at either carotid bifurcation. Flow is antegrade in the vertebral arteries bilaterally. A 3 vessel arch configuration is present. The right common carotid artery is within normal limits. The bifurcation is unremarkable. The cervical right ICA is tortuous just below the skullbase. There is no associated stenosis. The left common carotid artery is within normal limits. The carotid bifurcation is unremarkable. There is some tortuosity of the distal cervical ICA just below the skullbase. There is no associated stenosis. Both vertebral arteries originate from the subclavian arteries. The left vertebral artery is dominant. The right vertebral artery is hypoplastic, bifurcating at the PICA. IMPRESSION: 1. Acute nonhemorrhagic infarct involving the right frontal operculum. 2. Focal high-grade stenosis in the anterior right frontal operculum branch vessel of the right MCA. 3. No other significant proximal stenosis, aneurysm, or branch vessel occlusion within the circle of Willis. 4. No significant stenosis in the neck. 5. 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 Electronically Signed   By: Marin Roberts M.D.   On: 10/26/2015 20:48    12-lead ECG sinus rhythm, rate 84, normal intervals All prior EKG's in  EPIC reviewed with no documented atrial fibrillation  Assessment and Plan:  1. Cryptogenic stroke The patient presents with cryptogenic stroke.  The patient has a TEE planned for this AM.  I spoke at length with the patient about monitoring for afib with either a 30 day event monitor or an implantable loop recorder.  Risks, benefits, and alteratives to implantable loop recorder were discussed with the patient today.   At this time, the patient is very clear in their decision to proceed with implantable loop recorder.   Wound care was reviewed with the patient (keep incision clean and dry for 3 days).  Wound check scheduled  Please  call with questions.   Gypsy BalsamAmber Seiler, NP 10/28/2015 8:10 AM  I have seen, examined the patient, and reviewed the above assessment and plan.  On exam, RRR.  Changes to above are made where necessary.   Awaiting TEE results with plans to proceed with ILR if no reason for stroke is determined.  Co Sign: Hillis RangeJames Gustabo Gordillo, MD 10/28/2015 9:57 AM

## 2015-10-28 NOTE — Progress Notes (Signed)
Parfait Study Medications given at 1158am. (2) 81mg  aspirin and (4) placebo pills given.

## 2015-10-28 NOTE — Progress Notes (Signed)
Patient returned to room from TEE and Loop recorder at 1840. Advised patient and spouse that educator would meet with them shortly to review loop recorder information and view video. Diet order placed.

## 2015-10-28 NOTE — CV Procedure (Signed)
Procedure: TEE  Indication: CVA  Sedation: Versed 5 mg IV, Fentanyl 50 mcg IV  Findings: Please see echo section for full report.  Normal LV size with EF 55-60%.  Normal wall motion.  Normal RV size and systolic function.  Normal left and right atrial sizes.  No left atrial appendage thrombus.  There was a moderate-sized PFO present with a markedly positive bubble study.  There was mild mitral regurgitation.  Trileaflet aortic valve with mild aortic insufficiency, no aortic stenosis.  The aortic root was aneurysmal, measuring 4.9 cm.  The ascending aorta measured 4.0 cm.  There was minimal plaque in the aorta.   Impression: Moderate PFO with markedly positive bubble study.  Aortic root aneurysm.   Fred Russell 10/28/2015 4:47 PM

## 2015-10-28 NOTE — Progress Notes (Signed)
STROKE TEAM PROGRESS NOTE    SUBJECTIVE (INTERVAL HISTORY) Friends and Designer, industrial/productresearch coordinator, venipuncture tech at the bedside exited when we arrived.  He states is doing well with no complaints   OBJECTIVE Temp:  [98 F (36.7 C)-98.9 F (37.2 C)] 98 F (36.7 C) (12/13 1100) Pulse Rate:  [56-64] 56 (12/13 1100) Cardiac Rhythm:  [-]  Resp:  [18-20] 20 (12/13 1100) BP: (116-155)/(71-88) 116/75 mmHg (12/13 1100) SpO2:  [95 %-100 %] 100 % (12/13 1100)  CBC:   Recent Labs Lab 10/26/15 1517 10/26/15 1520  WBC 8.4  --   NEUTROABS 6.5  --   HGB 14.1 15.3  HCT 41.2 45.0  MCV 93.0  --   PLT 177  --     Basic Metabolic Panel:   Recent Labs Lab 10/27/15 1113 10/27/15 1643 10/28/15 0737  NA 140  --  138  K 3.7  --  3.6  CL 103  --  102  CO2 31  --  28  GLUCOSE 97  --  101*  BUN 17  --  14  CREATININE 1.00  --  1.00  CALCIUM 9.6  --  9.0  PHOS  --  3.8  --     Lipid Panel:     Component Value Date/Time   CHOL 206* 10/27/2015 0317   TRIG 119 10/27/2015 0317   HDL 40* 10/27/2015 0317   CHOLHDL 5.2 10/27/2015 0317   VLDL 24 10/27/2015 0317   LDLCALC 142* 10/27/2015 0317   HgbA1c:  Lab Results  Component Value Date   HGBA1C 5.4 10/27/2015   Urine Drug Screen:     Component Value Date/Time   LABOPIA NONE DETECTED 10/26/2015 1640   COCAINSCRNUR NONE DETECTED 10/26/2015 1640   LABBENZ NONE DETECTED 10/26/2015 1640   AMPHETMU NONE DETECTED 10/26/2015 1640   THCU NONE DETECTED 10/26/2015 1640   LABBARB NONE DETECTED 10/26/2015 1640      IMAGING  Dg Chest 2 View 10/26/2015    No active cardiopulmonary disease.   Ct Head Wo Contrast 10/26/2015  Small right anterior frontal hypodensity compatible with acute infarction, no associated hemorrhage or significant mass effect.   MRI Head, MRA Head & Neck 10/26/2015    1. Acute nonhemorrhagic infarct involving the right frontal operculum. 2. Focal high-grade stenosis in the anterior right frontal operculum branch  vessel of the right MCA. 3. No other significant proximal stenosis, aneurysm, or branch vessel occlusion within the circle of Willis. 4. No significant stenosis in the neck. 5. 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   2D Echocardiogram  - Left ventricle: Global LV longitudinal strain is 16% The cavitysize was normal. Systolic function was normal. The estimatedejection fraction was in the range of 55% to 60%. Wall motion wasnormal; there were no regional wall motion abnormalities. Therewas an increased relative contribution of atrial contraction toventricular filling. Doppler parameters are consistent withabnormal left ventricular relaxation (grade 1 diastolicdysfunction). - Aortic valve: There was mild regurgitation. - Aorta: Aortic root dimension: 45 mm (ED). 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 - Mitral valve: There was mild regurgitation.  TEE pending   PHYSICAL EXAM Pleasant middle-age Caucasian male not in distress. . Afebrile. Head is nontraumatic. Neck is supple without bruit.    Cardiac exam no murmur or gallop. Lungs are clear to auscultation. Distal pulses are well felt. Neurological Exam :  Awake alert oriented x 3 normal speech and language. Mild left lower face asymmetry now improved. Tongue midline.  No drift. Mild diminished fine finger movements on left. Orbits right over left upper extremity. Mild left grip weak.. Normal sensation . Normal coordination.    .   ASSESSMENT/PLAN [name] is a 64 y.o. male with history of HTN presenting with slurred speech and left facial weakness. He did not receive IV t-PA due to delay in arrival.   Stroke:  right frontal opercular infarct embolic secondary to unknown source, enrolled in PARFAIT research study  Resultant  Neuro deficits resolved  MRI  R frontal operculum infarct  MRA  R fronal operculum branch or R MCA focal high-grade stenosis  MRA neck Unremarkable    2D Echo  No source of embolus   TEE to look for embolic source. Arranged with [facility] for today at 4p.  If positive for PFO (patent foramen ovale), check bilateral lower extremity venous dopplers to rule out DVT as possible source of stroke.   If TEE negative, a [facility] electrophysiologist will consult and consider placement of an implantable loop recorder to evaluate for atrial fibrillation as etiology of stroke. This has been explained to patient by Dr. Pearlean Russell and he is agreeable.   LDL 142  HgbA1c 5.4  Lovenox 30 mg sq daily for VTE prophylaxis Diet NPO time specified  aspirin 81 mg daily prior to admission, changed to parfait study drugs  Patient enrolled in the PARFAIT trial. [facility]  Please contact [facility] at [phone] for any questions. UA ordered for research purposes  Patient counseled to be compliant with his antithrombotic medications  Ongoing aggressive stroke risk factor management  Therapy recommendations:  No PT or OT needs  Disposition:   return home after TEE and/or loop  Follow up with Dr. Pearlean Russell, stroke clinic, in 1 month (order written)  Hypertension  Home meds: lisinopril, HCTZ  elevated on admission to 165/100  Remains Stable   Hyperlipidemia  Home meds:  No statin  LDL 142, goal < 70  New lipitor 40 mg added  Continue statin at discharge  Hospital day # 2  Fred Russell [facility] See Amion for Pager information 10/28/2015 11:07 AM  I have personally examined this patient, reviewed notes, independently viewed imaging studies, participated in medical decision making and plan of care. I have made any additions or clarifications directly to the above note. Agree with note above. Likely discharge home after TEE and/or  loop recorder. Patient advised to take PARFAIT study medication as instructed. Follow-up in the office as per study  protocol  Fred Heady, MD Medical Director [facility] St[facility] Pager: [phone] 10/28/2015 4:18 PM  To contact Stroke Continuity provider, please refer to [email or web address]. After hours, contact General Neurology

## 2015-10-28 NOTE — Progress Notes (Signed)
SLP Cancellation Note  Patient Details Name: Fred Russell MRN: [id number] DOB: [birth year]   Cancelled treatment:       Reason Eval/Treat Not Completed: OT and SLP screened, no needs identified, will sign off.    Fae PippinMelissa Horris Speros, M.A., CCC-SLP [phone]  Pearson Reasons 10/28/2015, 9:31 AM

## 2015-10-28 NOTE — Progress Notes (Signed)
*  Preliminary Results* Bilateral lower extremity venous duplex completed. Bilateral lower extremities are negative for acute deep vein thrombosis. There is evidence of fibrin stranding in a single left posterior tibial vein, suggestive of chronic deep vein thrombosis. There is no evidence of Baker's cyst bilaterally.  Preliminary results discussed with Dr. Johney FrameAllred.  10/28/2015  Gertie FeyMichelle Swathi Dauphin, RVT, RDCS, RDMS

## 2015-10-28 NOTE — Progress Notes (Signed)
Family Medicine Teaching Service Daily Progress Note Intern Pager: [phone]  Patient name: Fred Russell Medical record number: [id number] Date of birth: 07/16/1951 Age: 64 y.o. Gender: male  Primary Care Provider: Milana Obey, MD Consultants: Neuro Code Status: DNR  Pt Overview and Major Events to Date:   Assessment and Plan: Fred Russell is a 64 y.o. male presenting with aphasia and left facial droop. PMH is significant for HTN and repeat shoulder surgeries.  #CVA: cocnern for cryptogenic stroke per cardiology - Neuro consulted, appreciate recs: obtain TEE and loop recorder 10/28/15 to look for embolic source.  - A1c 5.4 - TSH nml at 2.885 - Lipid panel with total cholesterol 206, HDL 40, LDL 142. - ASCVD risk 16.3%. Will start lipitor 40 mg daily. 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  - MRA of the neck with contrast showed no significant stenosis.   #HTN: BPs elevated in ED most likely 2/2 stroke. BPs now 130s/70s.  - Continue to hold home lisinopril - Restarted HCTZ    FEN/GI: NPO at midnight for TEE; saline lock IV Prophylaxis: Lovenox  Disposition: Admitted to med-surg for stroke work-up.  Subjective:  - no noted deficits, denies chest pain, SOB, palpitation  Objective: Temp:  [98.2 F (36.8 C)-98.9 F (37.2 C)] 98.6 F (37 C) (12/13 0554) Pulse Rate:  [56-64] 57 (12/13 0554) Resp:  [18-20] 18 (12/13 0554) BP: (120-155)/(71-88) 131/71 mmHg (12/13 0554) SpO2:  [95 %-96 %] 95 % (12/13 0554) Physical Exam: General: Athletic man, sitting in bed comfortably, NAD Cardiovascular: RRR, S1, S2, no m/r/g Respiratory: CTAB, no increased WOB Neuro: No aphasia. CN II-XII intact, Strength 5/5  globally, AOx4. Psych: Mood and affect normal, pleasant and cooperative   Laboratory:  Recent Labs Lab 10/26/15 1517 10/26/15 1520  WBC 8.4  --   HGB 14.1 15.3  HCT 41.2 45.0  PLT 177  --     Recent Labs Lab 10/26/15 1517 10/26/15 1520 10/27/15 1113  NA 138 139 140  K 3.7 3.7 3.7  CL 102 98* 103  CO2 29  --  31  BUN 19 24* 17  CREATININE 1.45* 1.40* 1.00  CALCIUM 9.4  --  9.6  PROT 6.5  --   --   BILITOT 0.9  --   --   ALKPHOS 45  --   --   ALT 18  --   --   AST 23  --   --   GLUCOSE 115* 109* 97    Imaging/Diagnostic Tests: Dg Chest 2 View  10/26/2015  CLINICAL DATA:  Stroke with history of hypertension EXAM: CHEST  2 VIEW COMPARISON:  None. FINDINGS: Significant sigmoid scoliotic curvature of the thoracolumbar spine. Heart size within normal limits as is pulmonary vascular pattern. No evidence of consolidation or effusion. IMPRESSION: No active cardiopulmonary disease. Electronically Signed   By: Esperanza Heir M.D.   On: 10/26/2015 19:26   Ct Head Wo Contrast  10/26/2015  CLINICAL DATA:  Acute stroke symptoms, left facial droop, confusion, ectasia complicated stroke EXAM: CT HEAD WITHOUT CONTRAST TECHNIQUE: Contiguous axial images were obtained from the base of the skull through the vertex without contrast. COMPARISON:  None available FINDINGS: Small right frontal ill-defined hypodensity with loss of gray-white differentiation, images 13-17,  compatible with a small acute stroke involving the anterior MCA territory. No associated intracranial hemorrhage, significant mass effect, midline shift, herniation or hydrocephalus. Symmetric midline ventricles. Cisterns are patent. No cerebellar abnormality. Atherosclerosis of the intracranial vessels of the skullbase. Orbits are symmetric. Mastoids and sinuses remain clear. No skull abnormality. IMPRESSION: Small right anterior frontal hypodensity compatible with acute infarction, no associated hemorrhage or significant mass  effect. These results were called by telephone at the time of interpretation on 10/26/2015 at 3:15 pm to Dr. Amada Jupiter, who verbally acknowledged these results. Electronically Signed   By: Judie Petit.  Shick M.D.   On: 10/26/2015 15:16   Mr Maxine Glenn Head Wo Contrast  10/26/2015  CLINICAL DATA:  Altered mental status. Slurred speech. Abnormal CT scan. EXAM: MRI HEAD WITHOUT AND WITH CONTRAST MRA HEAD WITHOUT CONTRAST MRA NECK WITHOUT AND WITH CONTRAST TECHNIQUE: Multiplanar, multiecho pulse sequences of the brain and surrounding structures were obtained without and with intravenous contrast. Angiographic images of the Circle of Willis were obtained using MRA technique without intravenous contrast. Angiographic images of the neck were obtained using MRA technique without and with intravenous contrast. Carotid stenosis measurements (when applicable) are obtained utilizing NASCET criteria, using the distal internal carotid diameter as the denominator. CONTRAST:  17mL MULTIHANCE GADOBENATE DIMEGLUMINE 529 MG/ML IV SOLN COMPARISON:  CT head without contrast from the same day. FINDINGS: MRI HEAD FINDINGS An acute nonhemorrhagic infarct within the right frontal operculum is confirmed. Cortical T2 changes are associated with the area of acute/subacute infarct. Mild subcortical T2 changes on the left are somewhat advanced for age. Minimal periventricular changes are present. There is focal ossification within the anterior falx. The postcontrast images demonstrate no pathologic enhancement. Flow is present in the major intracranial arteries. The globes and orbits are intact. Polyps or mucous retention cysts are noted in the left maxillary sinus inferiorly. The paranasal sinuses are otherwise clear. MRA HEAD FINDINGS Mild tortuosity in the high cervical internal carotid arteries is advanced for age. There is no associated stenosis. The internal carotid arteries are otherwise within normal limits to the ICA termini bilaterally. The A1  and M1 segments are normal. The anterior communicating artery is patent. There is focal attenuation in stenosis within the anterior operculum ir right MCA branch. The ACA and MCA branch vessels are otherwise intact. The left vertebral artery is the dominant. A hypoplastic right vertebral artery bifurcates at the PICA, sending only a very small branch to the basilar artery. The basilar artery is otherwise within normal limits. Both posterior cerebral arteries originate from the basilar tip. The PCA branch vessels are intact. MRA NECK FINDINGS The time-of-flight images demonstrate no significant flow disturbance at either carotid bifurcation. Flow is antegrade in the vertebral arteries bilaterally. A 3 vessel arch configuration is present. The right common carotid artery is within normal limits. The bifurcation is unremarkable. The cervical right ICA is tortuous just below the skullbase. There is no associated stenosis. The left common carotid artery is within normal limits. The carotid bifurcation is unremarkable. There is some tortuosity of the distal cervical ICA just below the skullbase. There is no associated stenosis. Both vertebral arteries originate from the subclavian arteries. The left vertebral artery is dominant. The right vertebral artery is hypoplastic, bifurcating at the PICA. IMPRESSION: 1. Acute nonhemorrhagic infarct involving the right frontal operculum. 2. Focal high-grade stenosis in the anterior right frontal operculum branch vessel of the right MCA. 3. No other significant proximal stenosis, aneurysm, or branch vessel occlusion within the circle of Willis.  4. No significant stenosis in the neck. 5. 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 Electronically Signed   By: Marin Roberts M.D.   On: 10/26/2015 20:48   Mr Angiogram Neck W Wo Contrast  10/26/2015  CLINICAL DATA:  Altered mental status. Slurred speech. Abnormal CT scan. EXAM: MRI HEAD WITHOUT AND WITH  CONTRAST MRA HEAD WITHOUT CONTRAST MRA NECK WITHOUT AND WITH CONTRAST TECHNIQUE: Multiplanar, multiecho pulse sequences of the brain and surrounding structures were obtained without and with intravenous contrast. Angiographic images of the Circle of Willis were obtained using MRA technique without intravenous contrast. Angiographic images of the neck were obtained using MRA technique without and with intravenous contrast. Carotid stenosis measurements (when applicable) are obtained utilizing NASCET criteria, using the distal internal carotid diameter as the denominator. CONTRAST:  17mL MULTIHANCE GADOBENATE DIMEGLUMINE 529 MG/ML IV SOLN COMPARISON:  CT head without contrast from the same day. FINDINGS: MRI HEAD FINDINGS An acute nonhemorrhagic infarct within the right frontal operculum is confirmed. Cortical T2 changes are associated with the area of acute/subacute infarct. Mild subcortical T2 changes on the left are somewhat advanced for age. Minimal periventricular changes are present. There is focal ossification within the anterior falx. The postcontrast images demonstrate no pathologic enhancement. Flow is present in the major intracranial arteries. The globes and orbits are intact. Polyps or mucous retention cysts are noted in the left maxillary sinus inferiorly. The paranasal sinuses are otherwise clear. MRA HEAD FINDINGS Mild tortuosity in the high cervical internal carotid arteries is advanced for age. There is no associated stenosis. The internal carotid arteries are otherwise within normal limits to the ICA termini bilaterally. The A1 and M1 segments are normal. The anterior communicating artery is patent. There is focal attenuation in stenosis within the anterior operculum ir right MCA branch. The ACA and MCA branch vessels are otherwise intact. The left vertebral artery is the dominant. A hypoplastic right vertebral artery bifurcates at the PICA, sending only a very small branch to the basilar artery.  The basilar artery is otherwise within normal limits. Both posterior cerebral arteries originate from the basilar tip. The PCA branch vessels are intact. MRA NECK FINDINGS The time-of-flight images demonstrate no significant flow disturbance at either carotid bifurcation. Flow is antegrade in the vertebral arteries bilaterally. A 3 vessel arch configuration is present. The right common carotid artery is within normal limits. The bifurcation is unremarkable. The cervical right ICA is tortuous just below the skullbase. There is no associated stenosis. The left common carotid artery is within normal limits. The carotid bifurcation is unremarkable. There is some tortuosity of the distal cervical ICA just below the skullbase. There is no associated stenosis. Both vertebral arteries originate from the subclavian arteries. The left vertebral artery is dominant. The right vertebral artery is hypoplastic, bifurcating at the PICA. IMPRESSION: 1. Acute nonhemorrhagic infarct involving the right frontal operculum. 2. Focal high-grade stenosis in the anterior right frontal operculum branch vessel of the right MCA. 3. No other significant proximal stenosis, aneurysm, or branch vessel occlusion within the circle of Willis. 4. No significant stenosis in the neck. 5. 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 Electronically Signed   By: Marin Roberts M.D.   On: 10/26/2015 20:48   Mr Brain Wo Contrast  10/27/2015  CLINICAL DATA:  Left-sided weakness. EXAM: MRI HEAD WITHOUT CONTRAST TECHNIQUE: Multiplanar, multiecho pulse sequences of the brain and surrounding structures were obtained without intravenous contrast. COMPARISON:  10/26/2015. FINDINGS: An acute  nonhemorrhagic infarct involving the RIGHT frontal operculum is redemonstrated. No new areas of involvement are observed. Normal for age cerebral volume. White matter disease is redemonstrated and stable. Chronic sinus disease. Flow voids are  maintained. IMPRESSION: Stable acute nonhemorrhagic RIGHT MCA territory infarct. Electronically Signed   By: Elsie StainJohn T Curnes M.D.   On: 10/27/2015 18:40   Mr Laqueta JeanBrain W RUWo Contrast  10/26/2015  CLINICAL DATA:  Altered mental status. Slurred speech. Abnormal CT scan. EXAM: MRI HEAD WITHOUT AND WITH CONTRAST MRA HEAD WITHOUT CONTRAST MRA NECK WITHOUT AND WITH CONTRAST TECHNIQUE: Multiplanar, multiecho pulse sequences of the brain and surrounding structures were obtained without and with intravenous contrast. Angiographic images of the Circle of Willis were obtained using MRA technique without intravenous contrast. Angiographic images of the neck were obtained using MRA technique without and with intravenous contrast. Carotid stenosis measurements (when applicable) are obtained utilizing NASCET criteria, using the distal internal carotid diameter as the denominator. CONTRAST:  17mL MULTIHANCE GADOBENATE DIMEGLUMINE 529 MG/ML IV SOLN COMPARISON:  CT head without contrast from the same day. FINDINGS: MRI HEAD FINDINGS An acute nonhemorrhagic infarct within the right frontal operculum is confirmed. Cortical T2 changes are associated with the area of acute/subacute infarct. Mild subcortical T2 changes on the left are somewhat advanced for age. Minimal periventricular changes are present. There is focal ossification within the anterior falx. The postcontrast images demonstrate no pathologic enhancement. Flow is present in the major intracranial arteries. The globes and orbits are intact. Polyps or mucous retention cysts are noted in the left maxillary sinus inferiorly. The paranasal sinuses are otherwise clear. MRA HEAD FINDINGS Mild tortuosity in the high cervical internal carotid arteries is advanced for age. There is no associated stenosis. The internal carotid arteries are otherwise within normal limits to the ICA termini bilaterally. The A1 and M1 segments are normal. The anterior communicating artery is patent. There  is focal attenuation in stenosis within the anterior operculum ir right MCA branch. The ACA and MCA branch vessels are otherwise intact. The left vertebral artery is the dominant. A hypoplastic right vertebral artery bifurcates at the PICA, sending only a very small branch to the basilar artery. The basilar artery is otherwise within normal limits. Both posterior cerebral arteries originate from the basilar tip. The PCA branch vessels are intact. MRA NECK FINDINGS The time-of-flight images demonstrate no significant flow disturbance at either carotid bifurcation. Flow is antegrade in the vertebral arteries bilaterally. A 3 vessel arch configuration is present. The right common carotid artery is within normal limits. The bifurcation is unremarkable. The cervical right ICA is tortuous just below the skullbase. There is no associated stenosis. The left common carotid artery is within normal limits. The carotid bifurcation is unremarkable. There is some tortuosity of the distal cervical ICA just below the skullbase. There is no associated stenosis. Both vertebral arteries originate from the subclavian arteries. The left vertebral artery is dominant. The right vertebral artery is hypoplastic, bifurcating at the PICA. IMPRESSION: 1. Acute nonhemorrhagic infarct involving the right frontal operculum. 2. Focal high-grade stenosis in the anterior right frontal operculum branch vessel of the right MCA. 3. No other significant proximal stenosis, aneurysm, or branch vessel occlusion within the circle of Willis. 4. No significant stenosis in the neck. 5. 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 Electronically Signed   By: Marin Robertshristopher  Mattern M.D.   On: 10/26/2015 20:48    Bonney AidAlyssa A Lataunya Ruud, MD 10/28/2015, 8:16 AM PGY-2, [facility] FPTS Intern  pager: [phone], text pages welcome

## 2015-10-28 NOTE — H&P (View-Only) (Signed)
STROKE TEAM PROGRESS NOTE    SUBJECTIVE (INTERVAL HISTORY) Friends and Designer, industrial/productresearch coordinator, venipuncture tech at the bedside exited when we arrived.  He states is doing well with no complaints   OBJECTIVE Temp:  [98 F (36.7 C)-98.9 F (37.2 C)] 98 F (36.7 C) (12/13 1100) Pulse Rate:  [56-64] 56 (12/13 1100) Cardiac Rhythm:  [-]  Resp:  [18-20] 20 (12/13 1100) BP: (116-155)/(71-88) 116/75 mmHg (12/13 1100) SpO2:  [95 %-100 %] 100 % (12/13 1100)  CBC:   Recent Labs Lab 10/26/15 1517 10/26/15 1520  WBC 8.4  --   NEUTROABS 6.5  --   HGB 14.1 15.3  HCT 41.2 45.0  MCV 93.0  --   PLT 177  --     Basic Metabolic Panel:   Recent Labs Lab 10/27/15 1113 10/27/15 1643 10/28/15 0737  NA 140  --  138  K 3.7  --  3.6  CL 103  --  102  CO2 31  --  28  GLUCOSE 97  --  101*  BUN 17  --  14  CREATININE 1.00  --  1.00  CALCIUM 9.6  --  9.0  PHOS  --  3.8  --     Lipid Panel:     Component Value Date/Time   CHOL 206* 10/27/2015 0317   TRIG 119 10/27/2015 0317   HDL 40* 10/27/2015 0317   CHOLHDL 5.2 10/27/2015 0317   VLDL 24 10/27/2015 0317   LDLCALC 142* 10/27/2015 0317   HgbA1c:  Lab Results  Component Value Date   HGBA1C 5.4 10/27/2015   Urine Drug Screen:     Component Value Date/Time   LABOPIA NONE DETECTED 10/26/2015 1640   COCAINSCRNUR NONE DETECTED 10/26/2015 1640   LABBENZ NONE DETECTED 10/26/2015 1640   AMPHETMU NONE DETECTED 10/26/2015 1640   THCU NONE DETECTED 10/26/2015 1640   LABBARB NONE DETECTED 10/26/2015 1640      IMAGING  Dg Chest 2 View 10/26/2015    No active cardiopulmonary disease.   Ct Head Wo Contrast 10/26/2015  Small right anterior frontal hypodensity compatible with acute infarction, no associated hemorrhage or significant mass effect.   MRI Head, MRA Head & Neck 10/26/2015    1. Acute nonhemorrhagic infarct involving the right frontal operculum. 2. Focal high-grade stenosis in the anterior right frontal operculum branch  vessel of the right MCA. 3. No other significant proximal stenosis, aneurysm, or branch vessel occlusion within the circle of Willis. 4. No significant stenosis in the neck. 5. 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   2D Echocardiogram  - Left ventricle: Global LV longitudinal strain is 16% The cavitysize was normal. Systolic function was normal. The estimatedejection fraction was in the range of 55% to 60%. Wall motion wasnormal; there were no regional wall motion abnormalities. Therewas an increased relative contribution of atrial contraction toventricular filling. Doppler parameters are consistent withabnormal left ventricular relaxation (grade 1 diastolicdysfunction). - Aortic valve: There was mild regurgitation. - Aorta: Aortic root dimension: 45 mm (ED). 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 - Mitral valve: There was mild regurgitation.  TEE pending   PHYSICAL EXAM Pleasant middle-age Caucasian male not in distress. . Afebrile. Head is nontraumatic. Neck is supple without bruit.    Cardiac exam no murmur or gallop. Lungs are clear to auscultation. Distal pulses are well felt. Neurological Exam :  Awake alert oriented x 3 normal speech and language. Mild left lower face asymmetry now improved. Tongue midline.  No drift. Mild diminished fine finger movements on left. Orbits right over left upper extremity. Mild left grip weak.. Normal sensation . Normal coordination.    .   ASSESSMENT/PLAN [name] is a 64 y.o. male with history of HTN presenting with slurred speech and left facial weakness. He did not receive IV t-PA due to delay in arrival.   Stroke:  right frontal opercular infarct embolic secondary to unknown source, enrolled in PARFAIT research study  Resultant  Neuro deficits resolved  MRI  R frontal operculum infarct  MRA  R fronal operculum branch or R MCA focal high-grade stenosis  MRA neck Unremarkable    2D Echo  No source of embolus   TEE to look for embolic source. Arranged with [facility] for today at 4p.  If positive for PFO (patent foramen ovale), check bilateral lower extremity venous dopplers to rule out DVT as possible source of stroke.   If TEE negative, a [facility] electrophysiologist will consult and consider placement of an implantable loop recorder to evaluate for atrial fibrillation as etiology of stroke. This has been explained to patient by Dr. Sethi and he is agreeable.   LDL 142  HgbA1c 5.4  Lovenox 30 mg sq daily for VTE prophylaxis Diet NPO time specified  aspirin 81 mg daily prior to admission, changed to parfait study drugs  Patient enrolled in the PARFAIT trial. [facility]  Please contact [facility] at [phone] for any questions. UA ordered for research purposes  Patient counseled to be compliant with his antithrombotic medications  Ongoing aggressive stroke risk factor management  Therapy recommendations:  No PT or OT needs  Disposition:   return home after TEE and/or loop  Follow up with Dr. Sethi, stroke clinic, in 1 month (order written)  Hypertension  Home meds: lisinopril, HCTZ  elevated on admission to 165/100  Remains Stable   Hyperlipidemia  Home meds:  No statin  LDL 142, goal < 70  New lipitor 40 mg added  Continue statin at discharge  Hospital day # 2  BIBY,SHARON  [facility] See Amion for Pager information 10/28/2015 11:07 AM  I have personally examined this patient, reviewed notes, independently viewed imaging studies, participated in medical decision making and plan of care. I have made any additions or clarifications directly to the above note. Agree with note above. Likely discharge home after TEE and/or  loop recorder. Patient advised to take PARFAIT study medication as instructed. Follow-up in the office as per study  protocol  Pramod Sethi, MD Medical Director [facility] Pager: [phone] 10/28/2015 4:18 PM  To contact Stroke Continuity provider, please refer to [email or web address]. After hours, contact General Neurology 

## 2015-10-28 NOTE — Progress Notes (Signed)
Pt gauze dressing from loop recorder is saturated with blood.  RN changed gauze dressing.  MD paged again and still awaiting discharge orders.  Will continue to monitor.   Estanislado EmmsAshley Schwarz, RN

## 2015-10-28 NOTE — Progress Notes (Signed)
  Echocardiogram 2D Echocardiogram has been performed.  Leta JunglingCooper, Lukas Pelcher M 10/28/2015, 5:10 PM

## 2015-10-28 NOTE — Progress Notes (Signed)
After talking to MDs, pt decided to stay the night.  Will continue to monitor.  Estanislado EmmsAshley Schwarz, RN

## 2015-10-28 NOTE — Interval H&P Note (Signed)
History and Physical Interval Note:  10/28/2015 4:24 PM  Marlowe AltJeff W Hoe  has presented today for surgery, with the diagnosis of STROKE   The various methods of treatment have been discussed with the patient and family. After consideration of risks, benefits and other options for treatment, the patient has consented to  Procedure(s): TRANSESOPHAGEAL ECHOCARDIOGRAM (TEE) (N/A) as a surgical intervention .  The patient's history has been reviewed, patient examined, no change in status, stable for surgery.  I have reviewed the patient's chart and labs.  Questions were answered to the patient's satisfaction.     Jaire Pinkham [employer]

## 2015-10-29 ENCOUNTER — Encounter (HOSPITAL_COMMUNITY): Payer: Self-pay | Admitting: Cardiology

## 2015-10-29 DIAGNOSIS — Q2112 Patent foramen ovale: Secondary | ICD-10-CM | POA: Insufficient documentation

## 2015-10-29 DIAGNOSIS — Q211 Atrial septal defect: Secondary | ICD-10-CM | POA: Insufficient documentation

## 2015-10-29 MED ORDER — ATORVASTATIN CALCIUM 40 MG PO TABS: 40.0000 mg | ORAL_TABLET | Freq: Every day | ORAL | Status: DC

## 2015-10-29 NOTE — Progress Notes (Signed)
Family Medicine Teaching Service Daily Progress Note Intern Pager: [phone]  Patient name: Fred Russell Palardy Medical record number: [id number] Date of birth: Aug 29, 1951 Age: 64 y.o. Gender: male  Primary Care Provider: Milana ObeyKNOWLTON,STEPHEN D, MD Consultants: Neuro Code Status: DNR  Pt Overview and Major Events to Date:  10/28/15 - Loop recorder placed; TEE performed  Assessment and Plan: Fred Russell Putzier is a 64 y.o. male presenting with aphasia and left facial droop. PMH is significant for HTN and repeat shoulder surgeries.  #CVA: concern for cryptogenic stroke - Neuro consulted, appreciate recs: obtain TEE and loop recorder 10/28/15 to look for embolic source --> TEE showed PFO and prelim read of LE venous duplex showing fibrin stranding of left PT vein, suggestive of old DVT - Neuro and Vascular Surgery do not recommend anticoagulation beyond aspirin. - Enrolled in PARFAIT research study during this hospitalization, with asa as part of study medications  - A1c 5.4; TSH nml at 2.885; Lipid panel with total cholesterol 206, HDL 40, LDL 142. - ASCVD risk 16.3%. Started lipitor 40 mg daily. 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  - MRA of the neck with contrast showed no significant stenosis.   #HTN: BPs elevated in ED most likely 2/2 stroke. BPs now 130s/80s.  - Hold home lisinopril until discharge - Continue HCTZ   #AKI: SCr 1.45 on admission - SCr improved to 1.00 - Restart home lisinopril upon discharge  FEN/GI: Heart healthy diet; saline lock IV Prophylaxis: Lovenox  Disposition: Admitted to med-surg for stroke work-up. Plan for discharge 10/29/15.   Subjective:  - no noted deficits - bleed over left chest where loop recorder placed  overnight, requiring bandage change but dry with only a steri-strip today  Objective: Temp:  [97.6 F (36.4 C)-98.4 F (36.9 C)] 98.2 F (36.8 C) (12/14 0609) Pulse Rate:  [55-76] 59 (12/14 0609) Resp:  [8-23] 20 (12/14 0609) BP: (116-178)/(73-106) 129/76 mmHg (12/14 0609) SpO2:  [93 %-100 %] 98 % (12/14 32440609) Physical Exam: General: Athletic man, sitting in bed comfortably, NAD Cardiovascular: RRR, S1, S2, no m/r/g Respiratory: CTAB, no increased WOB, talking with ease MSK: Slight TTP around site of loop recorder Skin: no erythema of left chest around incision site; bruises of forearms from blood draws  Neuro: No aphasia. CN II-XII intact, Strength 5/5 globally, AOx4. Psych: Mood and affect normal, pleasant and cooperative   Laboratory:  Recent Labs Lab 10/26/15 1517 10/26/15 1520  WBC 8.4  --   HGB 14.1 15.3  HCT 41.2 45.0  PLT 177  --     Recent Labs Lab 10/26/15 1517 10/26/15 1520 10/27/15 1113 10/28/15 0737  NA 138 139 140 138  K 3.7 3.7 3.7 3.6  CL 102 98* 103 102  CO2 29  --  31 28  BUN 19 24* 17 14  CREATININE 1.45* 1.40* 1.00 1.00  CALCIUM 9.4  --  9.6 9.0  PROT 6.5  --   --   --   BILITOT 0.9  --   --   --   ALKPHOS 45  --   --   --   ALT 18  --   --   --   AST 23  --   --   --   GLUCOSE 115* 109* 97 101*  Imaging/Diagnostic Tests: TEE showed moderate PFO with positive bubble study  LE Dopplers: Negative for acute DVT. 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   2D Echocardiogram  - Left ventricle: Global LV longitudinal strain is 16% The cavitysize was normal. Systolic function was normal. The estimatedejection fraction was in the range of 55% to 60%. Wall motion wasnormal; there were no regional wall motion abnormalities. Therewas an increased relative contribution of atrial contraction toventricular filling. Doppler parameters are consistent withabnormal left ventricular relaxation (grade 1  diastolicdysfunction). - Aortic valve: There was mild regurgitation. - Aorta: Aortic root dimension: 45 mm (ED). 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 - Mitral valve: There was mild regurgitation.  Casey Burkitt, MD 10/29/2015, 6:21 AM PGY-1, [facility] Family Medicine FPTS Intern pager: [phone], text pages welcome

## 2015-10-29 NOTE — Care Management Note (Signed)
Case Management Note  Patient Details  Name: Fred Russell MRN: [id number] Date of Birth: 04/25/1951  Subjective/Objective:   Patient admitted with CVA. Patient is from home with his spouse.                  Action/Plan: Patient discharging home today self care. No further needs per CM.    Expected Discharge Date:                  Expected Discharge Plan:  Home/Self Care  In-House Referral:     Discharge planning Services     Post Acute Care Choice:    Choice offered to:     DME Arranged:    DME Agency:     HH Arranged:    HH Agency:     Status of Service:  Completed, signed off  Medicare Important Message Given:    Date Medicare IM Given:    Medicare IM give by:    Date Additional Medicare IM Given:    Additional Medicare Important Message give by:     If discussed at Long Length of Stay Meetings, dates discussed:    Additional Comments:  Kermit BaloKelli F Deke Tilghman, RN 10/29/2015, 11:50 AM

## 2015-10-29 NOTE — Discharge Summary (Signed)
Family Medicine Teaching [facility] Discharge Summary  Patient name: Fred Russell Heiland Medical record number: [id number] Date of birth: 1951-10-10 Age: 64 y.o. Gender: male Date of Admission: 10/26/2015  Date of Discharge: 10/29/15 Admitting Physician: Latrelle DodrillBrittany J McIntyre, MD  Primary Care Provider: Milana ObeyKNOWLTON,STEPHEN D, MD Consultants: Neurology  Indication for Hospitalization: facial droop  Discharge Diagnoses/Problem List:  Active Problems:   Cerebrovascular accident (CVA) due to thrombosis of cerebral artery   Hyperlipidemia   Essential hypertension   PFO (patent foramen ovale)   Aneurysmal aortic root  Disposition: Home  Discharge Condition: Improved  Discharge Exam:  Blood pressure 139/80, pulse 56, temperature 98.1 F (36.7 C), temperature source Oral, resp. rate 20, height 6\' 2"  (1.88 m), weight 175 lb (79.379 kg), SpO2 97 %. General: Athletic man, sitting in bed comfortably, NAD Cardiovascular: RRR, S1, S2, no m/r/g Respiratory: CTAB, no increased WOB, talking with ease MSK: Slight TTP around site of loop recorder. Negative Homan's sign bilaterally.  Skin: no erythema of left chest around loop recorder incision site; bruises of forearms from blood draws  Neuro: No aphasia. CN II-XII intact, Strength 5/5 globally, AOx4. Psych: Mood and affect normal, pleasant and cooperative   Brief Hospital Course:  Fred Russell Babington is a 64 y.o. 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 PMH significant only for HTN.   On arrival, a code stroke was called in the ED. BP was elevated to 165/100. CT head showed new small right anterior frontal infarct. TPA not given as presented outside time window. He was asymptomatic by time of initial exam and had already taken asa 81 at home. Troponin was negative. ECG overall unremarkable. CXR without evidence of cardiopulmonary disease.   He was admitted for further evaluation and treatment, and  neurology continued to consult. MRI, MRA of the brain without contrast showedhigh-grade stenosis of frontal operculum branch of right MCA and acute nonhemorrhagic infarct of right frontal operculum. MRA of the neck with contrast showed no significant stenosis. TTE showed LVEF of 55-60%, grade 1 diastolic dysfunction, mild regurgitation of the aortic, tricuspid and mitral valve, and moderate dilation of the aortic root (which measured 4.9 cm on TEE). TEE was performed and loop recorder was placed to look for embolic source, including intermittent afib. TEE showed a moderate PFO, so LE dopplers were performed, which showed fibrin stranding of left PT vein, suggestive of an old DVT.   Risk stratification labs showed A1c 5.4; TSH nml at 2.885; Lipid panel with total cholesterol 206, HDL 40, LDL 142.bASCVD risk was 16.3%, so he was started on lipitor 40 mg daily.  Patient was enrolled in PARFAIT research study during this hospitalization, with aspirin as part of study medications for continued anticoagulation. Both Neurology and Vascular Surgery did not recommend anticoagulation beyond aspirin, reviewing discovery of PFO and evidence of old DVT.   Hospitalization was complicated by AKI with SCr 1.45 on admission, improving to 1.00 by discharge. Lisinopril was held during hospitalization. Blood pressures were in the 130s/80s with just home HCTZ.   PT/OT/SLP recommended no follow-up needs, as patient had no focal deficits upon discharge.   Issues for Follow Up:  1. If loop recorder results show intermittent afib, consider long-term anticoagulation.  2. Recommend BMET to check SCr, given AKI on admission and having restarted lisinopril upon discharge.  3. Surveillance imaging in 6-12 months to assess size of aortic aneurysm.  4. Follow-up with neurology and discuss management of PFO.  5.  Consider outpatient hypercoagulability workup.  Significant Procedures: ECHO, TEE, Loop recorder  insertion  Significant Labs and Imaging:   Recent Labs Lab 10/26/15 1517 10/26/15 1520  WBC 8.4  --   HGB 14.1 15.3  HCT 41.2 45.0  PLT 177  --     Recent Labs Lab 10/26/15 1517 10/26/15 1520 10/27/15 1113 10/27/15 1643 10/28/15 0737  NA 138 139 140  --  138  K 3.7 3.7 3.7  --  3.6  CL 102 98* 103  --  102  CO2 29  --  31  --  28  GLUCOSE 115* 109* 97  --  101*  BUN 19 24* 17  --  14  CREATININE 1.45* 1.40* 1.00  --  1.00  CALCIUM 9.4  --  9.6  --  9.0  PHOS  --   --   --  3.8  --   ALKPHOS 45  --   --   --   --   AST 23  --   --   --   --   ALT 18  --   --   --   --   ALBUMIN 4.0  --   --   --   --    Results/Tests Pending at Time of Discharge: None  Discharge Medications:    Medication List    STOP taking these medications        aspirin EC 81 MG tablet      TAKE these medications        atorvastatin 40 MG tablet  Commonly known as:  LIPITOR  Take 1 tablet (40 mg total) by mouth daily at 6 PM.     hydrochlorothiazide 25 MG tablet  Commonly known as:  HYDRODIURIL  Take 25 mg by mouth daily.     lisinopril 10 MG tablet  Commonly known as:  PRINIVIL,ZESTRIL  Take 10 mg by mouth daily.     naproxen 500 MG tablet  Commonly known as:  NAPROSYN  Take 500 mg by mouth 2 (two) times daily.        Discharge Instructions: Please refer to Patient Instructions section of EMR for full details.  Patient was counseled important signs and symptoms that should prompt return to medical care, changes in medications, dietary instructions, activity restrictions, and follow up appointments.   Follow-Up Appointments: Follow-up Information    Follow up with [facility] On 11/12/2015.   Specialty:  Cardiology   Why:  at 11AM for wound check   Contact information:   [address] [place] [address] [phone]      Follow up with Delia Heady, MD In 1 month.   Specialties:  Neurology, Radiology   Why:  Stroke  Clinic, Office will call you with appointment date & time   Contact information:   [address] [place] [address] [phone]       Follow up with Milana Obey, MD. Schedule an appointment as soon as possible for a visit in 2 days.   Specialty:  Family Medicine   Why:  for hospital follow-up   Contact information:   [address] [place] [address] [phone]       Casey Burkitt, MD 10/31/2015, 2:02 AM PGY-1, [facility] Family Medicine

## 2015-10-29 NOTE — Progress Notes (Signed)
Patient discharged home. RN discussed discharge instructions including medications, follow up appointments. Patient's neuro assessment unchanged, NIHHS 0. [facility] order placed in chart. Tele was removed previously, IV removed. Patient escorted to car by Psychiatristvolunteer staff.

## 2015-10-29 NOTE — Discharge Instructions (Signed)
Discharge Date: 10/29/2015  Reason for Hospitalization: Stroke  You were admitted to the hospital with a stroke. It does not look like you have any remaining deficits from stroke. Work-up revealed PFO (patent foramen ovale) which is a birth defect in the heart. You were placed in a trial by neurology that will last 3 weeks. Please take medications as instructed. Neurology stated that if you want to discuss closer of PFO you can at office visit in a month after the trial.   Please follow-up with cardiology, neurology, and your PCP as scheduled.  If you cannot make this appointment and need to reschedule, please call ahead of time.  New medications:  Trial medications  Please take medications as prescribed.  Please seek medical attention if you have any chest pain, shortness of breath, slurred speech, facial droop, weakness, or any other concerning symptoms.  Thank you for letting us participate in your care!  Stroke Prevention Some medical conditions and behaviors are associated with an increased chance of having a stroke. You may prevent a stroke by making healthy choices and managing medical conditions. HOW CAN I REDUCE MY RISK OF HAVING A STROKE?   Stay physically active. Get at least 30 minutes of activity on most or all days.  Do not smoke. It may also be helpful to avoid exposure to secondhand smoke.  Limit alcohol use. Moderate alcohol use is considered to be:  No more than 2 drinks per day for men.  No more than 1 drink per day for nonpregnant women.  Eat healthy foods. This involves:  Eating 5 or more servings of fruits and vegetables a day.  Making dietary changes that address high blood pressure (hypertension), high cholesterol, diabetes, or obesity.  Manage your cholesterol levels.  Making food choices that are high in fiber and low in saturated fat, trans fat, and cholesterol may control cholesterol levels.  Take any prescribed medicines to control cholesterol as  directed by your health care provider.  Manage your diabetes.  Controlling your carbohydrate and sugar intake is recommended to manage diabetes.  Take any prescribed medicines to control diabetes as directed by your health care provider.  Control your hypertension.  Making food choices that are low in salt (sodium), saturated fat, trans fat, and cholesterol is recommended to manage hypertension.  Ask your health care provider if you need treatment to lower your blood pressure. Take any prescribed medicines to control hypertension as directed by your health care provider.  If you are 9618-64 years of age, have your blood pressure checked every 3-5 years. If you are 64 years of age or older, have your blood pressure checked every year.  Maintain a healthy weight.  Reducing calorie intake and making food choices that are low in sodium, saturated fat, trans fat, and cholesterol are recommended to manage weight.  Stop drug abuse.  Avoid taking birth control pills.  Talk to your health care provider about the risks of taking birth control pills if you are over 64 years old, smoke, get migraines, or have ever had a blood clot.  Get evaluated for sleep disorders (sleep apnea).  Talk to your health care provider about getting a sleep evaluation if you snore a lot or have excessive sleepiness.  Take medicines only as directed by your health care provider.  For some people, aspirin or blood thinners (anticoagulants) are helpful in reducing the risk of forming abnormal blood clots that can lead to stroke. If you have the irregular heart rhythm of  atrial fibrillation, you should be on a blood thinner unless there is a good reason you cannot take them.  Understand all your medicine instructions.  Make sure that other conditions (such as anemia or atherosclerosis) are addressed. SEEK IMMEDIATE MEDICAL CARE IF:   You have sudden weakness or numbness of the face, arm, or leg, especially on one  side of the body.  Your face or eyelid droops to one side.  You have sudden confusion.  You have trouble speaking (aphasia) or understanding.  You have sudden trouble seeing in one or both eyes.  You have sudden trouble walking.  You have dizziness.  You have a loss of balance or coordination.  You have a sudden, severe headache with no known cause.  You have new chest pain or an irregular heartbeat. Any of these symptoms may represent a serious problem that is an emergency. Do not wait to see if the symptoms will go away. Get medical help at once. Call your local emergency services (911 in [place] Do not drive yourself to the hospital.   This information is not intended to replace advice given to you by your health care provider. Make sure you discuss any questions you have with your health care provider.   Document Released: 12/09/2004 Document Revised: 11/22/2014 Document Reviewed: 05/04/2013 Elsevier Interactive Patient Education [employer]

## 2015-10-30 ENCOUNTER — Telehealth: Payer: Self-pay | Admitting: Internal Medicine

## 2015-10-30 NOTE — Telephone Encounter (Signed)
I spoke with the pt and moved up his appointment to 10/31/15 with Dr Excell Seltzerooper.

## 2015-10-30 NOTE — Telephone Encounter (Signed)
New Message  Pt called request to have the possible surgery to correct the PFO this year. To save him money on his insurance he will need to have it completed by the end of the year. Pt states that this will save him $10,000.   * Sending this message to the office of Dr. Johney FrameAllred because the pt hasn't seen Dr. Excell Seltzerooper as of yet. Please assist

## 2015-10-31 ENCOUNTER — Ambulatory Visit (INDEPENDENT_AMBULATORY_CARE_PROVIDER_SITE_OTHER): Payer: BLUE CROSS/BLUE SHIELD | Admitting: Cardiovascular Disease

## 2015-10-31 ENCOUNTER — Encounter: Payer: Self-pay | Admitting: Cardiovascular Disease

## 2015-10-31 VITALS — BP 150/80 | HR 89 | Ht 74.0 in | Wt 182.8 lb

## 2015-10-31 DIAGNOSIS — Q211 Atrial septal defect: Secondary | ICD-10-CM

## 2015-10-31 DIAGNOSIS — I253 Aneurysm of heart: Secondary | ICD-10-CM

## 2015-10-31 NOTE — Patient Instructions (Signed)
Medication Instructions:  Your physician recommends that you continue on your current medications as directed. Please refer to the Current Medication list given to you today.  Labwork: No new orders.   Testing/Procedures: Dr Excell Seltzerooper has recommended PFO closure.  Please follow pre-procedure instructions.   Follow-Up: We will arrange further follow-up after your PFO closure.   Any Other Special Instructions Will Be Listed Below (If Applicable).     If you need a refill on your cardiac medications before your next appointment, please call your pharmacy.

## 2015-11-02 NOTE — Progress Notes (Signed)
Cardiology Office Note Date:  11/02/2015   ID:  [name], DOB [birth year], MRN [id number]  PCP:  Milana ObeyKNOWLTON,STEPHEN D, MD  Cardiologist:  Tonny Bollmanooper, Emanuel Campos, MD    Chief Complaint  Patient presents with  . OTHER    Evaluation of PFO    History of Present Illness: Fred Russell is a 64 y.o. 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 The patient has been a very healthy and physically-fit male well-controlled essential hypertension who has never been hospitalized. He was in his usual state of health until 12/12 when he presented with aphasia, confusion, and facial droop. A Code Stroke was called and he was evaluated by neurology and found to have a right frontal stroke. With improvement of symptoms, TPA was not indicated. Evaluation showed no evidence of hypercoagulability, LV dysfunction, valvular abnormalities, or extracranial carotid stenosis. He underwent TEE demonstrating PFO with spontaneous right-to-left shunting. He is enrolled in the Parfait clinical trial which is a 3 month randomized placebo controlled drug trial for stroke patients.   The patient denies any residual deficits. He has no cardiopulmonary symptoms. Today, he denies symptoms of palpitations, chest pain, shortness of breath, orthopnea, PND, lower extremity edema, dizziness, or syncope. He has been taking daily aspirin for 20 years and was compliant with this even at the time of his stroke.  Past Medical History  Diagnosis Date  . Hypertension   . Left foot drop 08/13/2015    Past Surgical History  Procedure Laterality Date  . Rotator cuff repair      x3  . Ep implantable device N/A 10/28/2015    Procedure: Loop Recorder Insertion;  Surgeon: Hillis RangeJames Allred, MD;  Location: [facility];  Service: Cardiovascular;  Laterality: N/A;  . Tee without cardioversion N/A 10/28/2015    Procedure: TRANSESOPHAGEAL ECHOCARDIOGRAM (TEE);  Surgeon: Laurey Moralealton S McLean, MD;  [facility];  Service:  Cardiovascular;  Laterality: N/A;    Current Outpatient Prescriptions  Medication Sig Dispense Refill  . aspirin 81 MG tablet Take 2 tablets by mouth daily    . atorvastatin (LIPITOR) 40 MG tablet Take 1 tablet (40 mg total) by mouth daily at 6 PM. 30 tablet 0  . hydrochlorothiazide (HYDRODIURIL) 25 MG tablet Take 25 mg by mouth daily.    [name]. lisinopril (PRINIVIL,ZESTRIL) 10 MG tablet Take 10 mg by mouth daily.     No current facility-administered medications for this visit.    Allergies:   Review of patient's allergies indicates no known allergies.   Social History:  The patient  reports that he has never smoked. He has never used smokeless tobacco. He reports that he does not drink alcohol or use illicit drugs.   Family History:  The patient's family history includes ALS in his father; COPD in his mother; Heart failure in his mother; Skin cancer in his mother.   ROS:  Please see the history of present illness.  All other systems are reviewed and negative.   PHYSICAL EXAM: VS:  BP 150/80 mmHg  Pulse 89  Ht 6\' 2"  (1.88 m)  Wt 182 lb 12.8 oz (82.918 kg)  BMI 23.46 kg/m2  SpO2 96% , BMI Body mass index is 23.46 kg/(m^2). GEN: Well nourished, well developed, physically fit male in no acute distress HEENT: normal Neck: no JVD, no masses. No carotid bruits Cardiac: RRR without murmur or gallop                Respiratory:  clear to  auscultation bilaterally, normal work of breathing GI: soft, nontender, nondistended, + BS MS: no deformity or atrophy Ext: no pretibial edema, pedal pulses 2+= bilaterally Skin: warm and dry, no rash Neuro:  Strength and sensation are intact Psych: euthymic mood, full affect  EKG:  EKG is not ordered today.  Recent Labs: 10/26/2015: ALT 18; Hemoglobin 15.3; Platelets 177; TSH 2.885 10/28/2015: BUN 14; Creatinine, Ser 1.00; Potassium 3.6; Sodium 138   Lipid Panel     Component Value Date/Time   CHOL 206* 10/27/2015 0317   TRIG 119 10/27/2015 0317     HDL 40* 10/27/2015 0317   CHOLHDL 5.2 10/27/2015 0317   VLDL 24 10/27/2015 0317   LDLCALC 142* 10/27/2015 0317      Wt Readings from Last 3 Encounters:  10/31/15 182 lb 12.8 oz (82.918 kg)  10/26/15 175 lb (79.379 kg)  08/13/15 182 lb (82.555 kg)     Cardiac Studies Reviewed: TEE: Study Conclusions  - Left ventricle: The cavity size was normal. Wall thickness was normal. Systolic function was normal. The estimated ejection fraction was in the range of 55% to 60%. Wall motion was normal; there were no regional wall motion abnormalities. - Aortic valve: Trileaflet. There was no stenosis. There was mild regurgitation. 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 - Mitral valve: There was mild regurgitation. - Left atrium: No evidence of thrombus in the atrial cavity or appendage. - Right ventricle: The cavity size was normal. Systolic function was normal. - Right atrium: No evidence of thrombus in the atrial cavity or appendage. - Atrial septum: There was a moderate PFO with a markedly positive bubble study. - Tricuspid valve: Peak RV-RA gradient 20 mmHg.  ASSESSMENT AND PLAN: PFO with cryptogenic stroke: Labs, imaging studies, and hospital records reviewed from the patient's recent hospitalization. TEE images are personally reviewed, and demonstrate dilatation of the aorta at the level of the sinuses of valsalva. 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 We discussed the proposed stroke pathophysiology when associated with PFO. We reviewed the common incidence of PFO in the general population, increased incidence in cryptogenic stroke, and other potential etiologies of stroke. We reviewed treatment options with antiplatelet/medical therapy versus consideration of transcatheter closure. I reviewed the risks, indications, and  alternatives to transcatheter closure with the patient. Specific risks include bleeding, infection, device embolization, stroke, cardiac perforation, tamponade, arrhythmia, MI, and late device erosion. He understands these serious risks occur at low incidence of < 1%.   The patient has a strong desire for transcatheter PFO closure. He works as a dentist, has multiple colleagues in the medical field, and has done a lot of research prior to our visit today. We had extensive discussion about timing, and he wants to move forward as soon as possible. I advised that we consider waiting for approximately 3 months to monitor for paroxysmal atrial fibrillation on his implantable loop recorder which would also allow him to complete the Parfait trial in which he is participating. Despite this discussion, for personal reasons and concern over risk of recurrent stroke, he would like to move forward with transcatheter PFO closure as soon as possible.  Current medicines are reviewed with the patient today.  The patient does not have concerns regarding medicines.  Labs/ tests ordered today include:  No orders of the defined types were placed in this encounter.    Signed, Mackenzy Eisenberg, MD  11/02/2015 9:39   AM    [facility] [address], [place], [place]  [address] Phone: [phone]; Fax: [phone]

## 2015-11-04 ENCOUNTER — Telehealth: Payer: Self-pay | Admitting: Cardiovascular Disease

## 2015-11-04 NOTE — Telephone Encounter (Signed)
I spoke with the pt and made him aware that I am waiting on information in regards to his pre-certification for PFO closure. I will contact the pt with further information when available.

## 2015-11-04 NOTE — Telephone Encounter (Signed)
New Message    Pt is calling about the heart operation that has   not yet been authorized by his surgery

## 2015-11-05 ENCOUNTER — Ambulatory Visit
Admission: RE | Admit: 2015-11-05 | Discharge: 2015-11-05 | Disposition: A | Discharge status: Home / Self Care | Payer: BLUE CROSS/BLUE SHIELD | Source: Ambulatory Visit | Attending: Orthopedic Surgery | Admitting: Orthopedic Surgery

## 2015-11-05 DIAGNOSIS — M8580 Other specified disorders of bone density and structure, unspecified site: Secondary | ICD-10-CM

## 2015-11-05 NOTE — Telephone Encounter (Signed)
I spoke with the pt and made him aware that precert is still pending for PFO closure on 11/14/15.    Once procedure is approved the pt will need to: 1. Contact [facility] and complete process for removal from study 2.  Start Plavix.  Take 300mg  on 11/13/15 and then start 75mg  once a day on 11/14/15 (Send in Rx for 75mg  once a day) 3.  When the pt starts Plavix he should decrease Aspirin to 81mg  once a day  I made the pt aware that a triage nurse will follow up with him once determination is made from his insurance.

## 2015-11-06 ENCOUNTER — Telehealth: Payer: Self-pay

## 2015-11-06 MED ORDER — CLOPIDOGREL BISULFATE 75 MG PO TABS: 75.0000 mg | ORAL_TABLET | Freq: Every day | ORAL | Status: DC

## 2015-11-06 NOTE — Telephone Encounter (Signed)
Spoke w/pt notifying him that no precert was required per Freeman CaldronDamion S, [employer] [place] [id number], for CPT [phone].  Pt requests Rx: Plavix be called into [facility] in [place].  Pt understands how to take Rx and will also do ASA as previously directed.  He is out of town right now but will pick up 11-12-15.  He will notify [facility] to withdraw from Parfait study.

## 2015-11-06 NOTE — Telephone Encounter (Signed)
I spoke to the patient in regards to the Parfait study. I performed the Visit 4 - Day 10 Telephone Call procedures. Patient denied changes in medications or adverse events. I told him that the Research Assistant will give him a call next week to schedule the MRI and the Day 28 Visit. I also reminded the patient about the importance of taking the study medication as indicated. Patient voiced understanding.

## 2015-11-06 NOTE — Telephone Encounter (Signed)
Plavix 75 mg daily called in to [facility] in [place] for patient pick-up.

## 2015-11-11 ENCOUNTER — Telehealth: Payer: Self-pay

## 2015-11-11 ENCOUNTER — Telehealth: Payer: Self-pay | Admitting: Neurology

## 2015-11-11 NOTE — Telephone Encounter (Signed)
Hi, Christian:  Loop recorder is compatible with MRI, so it is not a problem.   As far as I know the PFO closure device is designed to compatible with MRI. However, I am not sure how long after the procedure will be considered safe to have MRI. This question can only be answered by Dr. Tonny BollmanMichael Cooper who is the cardiologist doing the procedure.   So, could you please send him a message regarding the safety of interval time between the PFO procedure and MRI? He is very quick to return messages. You can cc the message to me too so that I will be in the loop. Thanks.  Marvel PlanJindong Lyne Khurana, MD PhD Stroke Neurology 11/11/2015 6:15 PM

## 2015-11-11 NOTE — Telephone Encounter (Signed)
I spoke with Fred Russell.  I called to schedule his future PARFAIT appointments.  He stated that tomorrow will be the last day that he will take the medication due to his surgery.  He stated that he will no longer be in the study.  I told him that he would need to speak with Fred Russell first before I scheduled any appointments.  Fred Russell stated that he spoke with Fred Russell about this before Christmas and that Fred Russell already knew about his surgery.  I let him know that Fred Russell had not communicated this information to me and that and I would need to communicate this with Fred Russell before I scheduled anything.  Christian spoke with Fred Russell and let him know that he would speak with Fred Russell first before anything was scheduled.

## 2015-11-11 NOTE — Telephone Encounter (Signed)
I spoke to the patient in regards to the Parfait study. I asked the patient what kind of device he had put in, and he stated that he currently has a loop recorder. He will have a PFO closure on [id number]. I told him that I will talk to the doctor to see if it's ok to have an MRI. Patient voiced understanding.

## 2015-11-11 NOTE — Telephone Encounter (Signed)
Spoke to patient on 12/23, Subject is scheduled for PFO closure on 12/30. He will take tke last dose of IP on 12/28 ( Wednesday). Subject agreed to return the IP and complete the remaining follow up visits per protocol. Dr. Pearlean BrownieSethi was informed.

## 2015-11-12 ENCOUNTER — Ambulatory Visit: Payer: BLUE CROSS/BLUE SHIELD

## 2015-11-13 ENCOUNTER — Ambulatory Visit (INDEPENDENT_AMBULATORY_CARE_PROVIDER_SITE_OTHER): Payer: BLUE CROSS/BLUE SHIELD | Admitting: *Deleted

## 2015-11-13 ENCOUNTER — Ambulatory Visit: Payer: BLUE CROSS/BLUE SHIELD

## 2015-11-13 ENCOUNTER — Encounter: Payer: Self-pay | Admitting: Internal Medicine

## 2015-11-13 ENCOUNTER — Institutional Professional Consult (permissible substitution): Payer: BLUE CROSS/BLUE SHIELD | Admitting: Cardiovascular Disease

## 2015-11-13 DIAGNOSIS — I6330 Cerebral infarction due to thrombosis of unspecified cerebral artery: Secondary | ICD-10-CM

## 2015-11-13 LAB — CUP PACEART INCLINIC DEVICE CHECK: Date Time Interrogation Session: 20161229152922

## 2015-11-13 NOTE — Progress Notes (Signed)
ILR Wound check appointment. Steri-strips removed prior to arrival. Wound without redness or edema. Incision edges approximated, wound well healed. Pt with 0 tachy episodes; 0 brady episodes; 0 asystole. 0 AF. Carelink summary reports & ROV w/ JA PRN.

## 2015-11-14 ENCOUNTER — Encounter (HOSPITAL_COMMUNITY)
Admission: RE | Disposition: A | Discharge status: Home / Self Care | Payer: Self-pay | Source: Ambulatory Visit | Attending: Cardiovascular Disease

## 2015-11-14 ENCOUNTER — Ambulatory Visit (HOSPITAL_COMMUNITY)
Admission: RE | Admit: 2015-11-14 | Discharge: 2015-11-15 | Disposition: A | Discharge status: Home / Self Care | Payer: BLUE CROSS/BLUE SHIELD | Source: Ambulatory Visit | Attending: Cardiovascular Disease | Admitting: Cardiovascular Disease

## 2015-11-14 ENCOUNTER — Encounter (HOSPITAL_COMMUNITY): Payer: Self-pay | Admitting: *Deleted

## 2015-11-14 ENCOUNTER — Other Ambulatory Visit: Payer: Self-pay | Admitting: *Deleted

## 2015-11-14 DIAGNOSIS — Q211 Atrial septal defect: Secondary | ICD-10-CM | POA: Insufficient documentation

## 2015-11-14 DIAGNOSIS — Z7982 Long term (current) use of aspirin: Secondary | ICD-10-CM | POA: Insufficient documentation

## 2015-11-14 DIAGNOSIS — I10 Essential (primary) hypertension: Secondary | ICD-10-CM | POA: Diagnosis not present

## 2015-11-14 DIAGNOSIS — I639 Cerebral infarction, unspecified: Secondary | ICD-10-CM | POA: Diagnosis present

## 2015-11-14 DIAGNOSIS — Z79899 Other long term (current) drug therapy: Secondary | ICD-10-CM | POA: Insufficient documentation

## 2015-11-14 DIAGNOSIS — E785 Hyperlipidemia, unspecified: Secondary | ICD-10-CM | POA: Insufficient documentation

## 2015-11-14 DIAGNOSIS — Z8673 Personal history of transient ischemic attack (TIA), and cerebral infarction without residual deficits: Secondary | ICD-10-CM | POA: Insufficient documentation

## 2015-11-14 DIAGNOSIS — Q2112 Patent foramen ovale: Secondary | ICD-10-CM

## 2015-11-14 HISTORY — DX: Patent foramen ovale: Q21.12

## 2015-11-14 HISTORY — DX: Essential (primary) hypertension: I10

## 2015-11-14 HISTORY — DX: Atrial septal defect: Q21.1

## 2015-11-14 HISTORY — PX: CARDIAC CATHETERIZATION: SHX172

## 2015-11-14 HISTORY — DX: Cerebral infarction, unspecified: I63.9

## 2015-11-14 LAB — CBC
HEMATOCRIT: 38.7 % — AB (ref 39.0–52.0)
HEMOGLOBIN: 13.2 g/dL (ref 13.0–17.0)
MCH: 31.7 pg (ref 26.0–34.0)
MCHC: 34.1 g/dL (ref 30.0–36.0)
MCV: 92.8 fL (ref 78.0–100.0)
Platelets: 141 10*3/uL — ABNORMAL LOW (ref 150–400)
RBC: 4.17 MIL/uL — ABNORMAL LOW (ref 4.22–5.81)
RDW: 13 % (ref 11.5–15.5)
WBC: 5.6 10*3/uL (ref 4.0–10.5)

## 2015-11-14 LAB — BASIC METABOLIC PANEL
Anion gap: 10 (ref 5–15)
BUN: 15 mg/dL (ref 6–20)
CHLORIDE: 103 mmol/L (ref 101–111)
CO2: 26 mmol/L (ref 22–32)
CREATININE: 0.96 mg/dL (ref 0.61–1.24)
Calcium: 9.3 mg/dL (ref 8.9–10.3)
GFR calc non Af Amer: >60 mL/min (ref >60)
Glucose, Bld: 97 mg/dL (ref 65–99)
Potassium: 3.5 mmol/L (ref 3.5–5.1)
Sodium: 139 mmol/L (ref 135–145)

## 2015-11-14 LAB — POCT ACTIVATED CLOTTING TIME
ACTIVATED CLOTTING TIME: 198 s
ACTIVATED CLOTTING TIME: 214 s
ACTIVATED CLOTTING TIME: 240 s
ACTIVATED CLOTTING TIME: 193 s
Activated Clotting Time: 173 seconds

## 2015-11-14 LAB — POCT I-STAT 3, ART BLOOD GAS (G3+)
ACID-BASE EXCESS: 5 mmol/L — AB (ref 0.0–2.0)
Bicarbonate: 30.2 mEq/L — ABNORMAL HIGH (ref 20.0–24.0)
O2 SAT: 96 %
PCO2 ART: 45 mmHg (ref 35.0–45.0)
TCO2: 32 mmol/L (ref 0–100)
pH, Arterial: 7.435 (ref 7.350–7.450)
pO2, Arterial: 78 mmHg — ABNORMAL LOW (ref 80.0–100.0)

## 2015-11-14 LAB — PROTIME-INR
INR: 1.08 (ref 0.00–1.49)
Prothrombin Time: 14.2 seconds (ref 11.6–15.2)

## 2015-11-14 SURGERY — ASD/VSD CLOSURE
Anesthesia: LOCAL

## 2015-11-14 MED ORDER — CLOPIDOGREL BISULFATE 75 MG PO TABS: 75.0000 mg | ORAL_TABLET | ORAL | Status: DC

## 2015-11-14 MED ORDER — HYDROCHLOROTHIAZIDE 25 MG PO TABS
25.0000 mg | ORAL_TABLET | Freq: Every day | ORAL | Status: DC
  Administered 2015-11-15: 25 mg via ORAL
  Filled 2015-11-14: qty 1

## 2015-11-14 MED ORDER — SODIUM CHLORIDE 0.9 % WEIGHT BASED INFUSION: 1.0000 mL/kg/h | INTRAVENOUS | Status: DC

## 2015-11-14 MED ORDER — FENTANYL CITRATE (PF) 100 MCG/2ML IJ SOLN
INTRAMUSCULAR | Status: DC | PRN
  Administered 2015-11-14: 25 ug via INTRAVENOUS
  Administered 2015-11-14: 50 ug via INTRAVENOUS
  Administered 2015-11-14: 25 ug via INTRAVENOUS

## 2015-11-14 MED ORDER — DIAZEPAM 5 MG PO TABS: 5.0000 mg | ORAL_TABLET | Freq: Three times a day (TID) | ORAL | Status: DC | PRN

## 2015-11-14 MED ORDER — IOHEXOL 350 MG/ML SOLN
INTRAVENOUS | Status: DC | PRN
  Administered 2015-11-14: 10 mL via INTRAVENOUS

## 2015-11-14 MED ORDER — HEPARIN (PORCINE) IN NACL 2-0.9 UNIT/ML-% IJ SOLN
INTRAMUSCULAR | Status: DC | PRN
  Administered 2015-11-14: 14:00:00

## 2015-11-14 MED ORDER — CEFAZOLIN SODIUM-DEXTROSE 2-3 GM-% IV SOLR
INTRAVENOUS | Status: AC
  Filled 2015-11-14: qty 50

## 2015-11-14 MED ORDER — CLOPIDOGREL BISULFATE 75 MG PO TABS
75.0000 mg | ORAL_TABLET | Freq: Every day | ORAL | Status: DC
  Administered 2015-11-15: 75 mg via ORAL
  Filled 2015-11-14 (×2): qty 1

## 2015-11-14 MED ORDER — LIDOCAINE HCL (PF) 1 % IJ SOLN
INTRAMUSCULAR | Status: AC
  Filled 2015-11-14: qty 30

## 2015-11-14 MED ORDER — SODIUM CHLORIDE 0.9 % IJ SOLN: 3.0000 mL | Freq: Two times a day (BID) | INTRAMUSCULAR | Status: DC

## 2015-11-14 MED ORDER — HEPARIN SODIUM (PORCINE) 1000 UNIT/ML IJ SOLN
INTRAMUSCULAR | Status: AC
  Filled 2015-11-14: qty 1

## 2015-11-14 MED ORDER — ATORVASTATIN CALCIUM 40 MG PO TABS
40.0000 mg | ORAL_TABLET | Freq: Every day | ORAL | Status: DC
  Administered 2015-11-14: 40 mg via ORAL
  Filled 2015-11-14: qty 1

## 2015-11-14 MED ORDER — FENTANYL CITRATE (PF) 100 MCG/2ML IJ SOLN
INTRAMUSCULAR | Status: AC
  Filled 2015-11-14: qty 2

## 2015-11-14 MED ORDER — SODIUM CHLORIDE 0.9 % IJ SOLN: 3.0000 mL | INTRAMUSCULAR | Status: DC | PRN

## 2015-11-14 MED ORDER — SODIUM CHLORIDE 0.9 % IV SOLN: 250.0000 mL | INTRAVENOUS | Status: DC | PRN

## 2015-11-14 MED ORDER — CEFAZOLIN SODIUM-DEXTROSE 2-3 GM-% IV SOLR
INTRAVENOUS | Status: DC | PRN
  Administered 2015-11-14: 2 g via INTRAVENOUS

## 2015-11-14 MED ORDER — HEPARIN SODIUM (PORCINE) 1000 UNIT/ML IJ SOLN
INTRAMUSCULAR | Status: DC | PRN
  Administered 2015-11-14: 6000 [IU] via INTRAVENOUS
  Administered 2015-11-14: 4000 [IU] via INTRAVENOUS

## 2015-11-14 MED ORDER — ASPIRIN EC 81 MG PO TBEC
81.0000 mg | DELAYED_RELEASE_TABLET | Freq: Every day | ORAL | Status: DC
Start: 2015-11-15 — End: 2015-11-15
  Administered 2015-11-15: 09:00:00 81 mg via ORAL
  Filled 2015-11-14 (×2): qty 1

## 2015-11-14 MED ORDER — SODIUM CHLORIDE 0.9 % IV SOLN
INTRAVENOUS | Status: AC
  Administered 2015-11-14: 17:00:00 via INTRAVENOUS

## 2015-11-14 MED ORDER — HEPARIN (PORCINE) IN NACL 2-0.9 UNIT/ML-% IJ SOLN
INTRAMUSCULAR | Status: AC
  Filled 2015-11-14: qty 500

## 2015-11-14 MED ORDER — MIDAZOLAM HCL 2 MG/2ML IJ SOLN
INTRAMUSCULAR | Status: AC
  Filled 2015-11-14: qty 2

## 2015-11-14 MED ORDER — ONDANSETRON HCL 4 MG/2ML IJ SOLN: 4.0000 mg | Freq: Four times a day (QID) | INTRAMUSCULAR | Status: DC | PRN

## 2015-11-14 MED ORDER — ACETAMINOPHEN 325 MG PO TABS
650.0000 mg | ORAL_TABLET | ORAL | Status: DC | PRN
  Administered 2015-11-14 (×2): 650 mg via ORAL
  Filled 2015-11-14 (×2): qty 2

## 2015-11-14 MED ORDER — SODIUM CHLORIDE 0.9 % WEIGHT BASED INFUSION
3.0000 mL/kg/h | INTRAVENOUS | Status: DC
  Administered 2015-11-14: 3 mL/kg/h via INTRAVENOUS

## 2015-11-14 MED ORDER — ASPIRIN 81 MG PO CHEW: 81.0000 mg | CHEWABLE_TABLET | Freq: Once | ORAL | Status: DC

## 2015-11-14 MED ORDER — CEFAZOLIN SODIUM 1-5 GM-% IV SOLN
INTRAVENOUS | Status: AC
  Filled 2015-11-14: qty 50

## 2015-11-14 MED ORDER — MIDAZOLAM HCL 2 MG/2ML IJ SOLN
INTRAMUSCULAR | Status: DC | PRN
  Administered 2015-11-14 (×3): 2 mg via INTRAVENOUS

## 2015-11-14 MED ORDER — LISINOPRIL 10 MG PO TABS
10.0000 mg | ORAL_TABLET | Freq: Every day | ORAL | Status: DC
Start: 2015-11-15 — End: 2015-11-15
  Administered 2015-11-15: 10 mg via ORAL
  Filled 2015-11-14: qty 1

## 2015-11-14 SURGICAL SUPPLY — 13 items
CATH ACUNAV 8FR 90CM (CATHETERS) ×1 IMPLANT
CATH INFINITI 5 FR RCB (CATHETERS) ×1 IMPLANT
CATH SITESEER 5F MULTI A 2 (CATHETERS) ×1 IMPLANT
COVER SWIFTLINK CONNECTOR (BAG) ×1 IMPLANT
GUIDEWIRE AMPLATZER 1.5JX260 (WIRE) ×1 IMPLANT
GUIDEWIRE ANGLED .035X150CM (WIRE) ×1 IMPLANT
KIT HEART LEFT (KITS) ×1 IMPLANT
OCCLUDER AMPLATZER PFO 25MM (Prosthesis & Implant Heart) ×1 IMPLANT
PACK CARDIAC CATHETERIZATION (CUSTOM PROCEDURE TRAY) ×1 IMPLANT
SHEATH PINNACLE 5F 10CM (SHEATH) ×1 IMPLANT
SHEATH PINNACLE 8F 10CM (SHEATH) ×1 IMPLANT
SHEATH PINNACLE 9F 10CM (SHEATH) ×1 IMPLANT
SYSTEM DELIVERY AMPLATZER 8FR (SHEATH) ×1 IMPLANT

## 2015-11-14 NOTE — H&P (View-Only) (Signed)
Cardiology Office Note Date:  11/02/2015   ID:  Fred GuessJeff W Russell, DOB [birth year], MRN [id number]  PCP:  Milana ObeyKNOWLTON,STEPHEN D, MD  Cardiologist:  Tonny Bollmanooper, Desree Leap, MD    Chief Complaint  Patient presents with  . OTHER    Evaluation of PFO    History of Present Illness: Fred AltJeff W Russell is a 64 y.o. 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 The patient has been a very healthy and physically-fit male well-controlled essential hypertension who has never been hospitalized. He was in his usual state of health until 12/12 when he presented with aphasia, confusion, and facial droop. A Code Stroke was called and he was evaluated by neurology and found to have a right frontal stroke. With improvement of symptoms, TPA was not indicated. Evaluation showed no evidence of hypercoagulability, LV dysfunction, valvular abnormalities, or extracranial carotid stenosis. He underwent TEE demonstrating PFO with spontaneous right-to-left shunting. He is enrolled in the Parfait clinical trial which is a 3 month randomized placebo controlled drug trial for stroke patients.   The patient denies any residual deficits. He has no cardiopulmonary symptoms. Today, he denies symptoms of palpitations, chest pain, shortness of breath, orthopnea, PND, lower extremity edema, dizziness, or syncope. He has been taking daily aspirin for 20 years and was compliant with this even at the time of his stroke.  Past Medical History  Diagnosis Date  . Hypertension   . Left foot drop 08/13/2015    Past Surgical History  Procedure Laterality Date  . Rotator cuff repair      x3  . Ep implantable device N/A 10/28/2015    Procedure: Loop Recorder Insertion;  Surgeon: Hillis RangeJames Allred, MD;  Location: [facility];  Service: Cardiovascular;  Laterality: N/A;  . Tee without cardioversion N/A 10/28/2015    Procedure: TRANSESOPHAGEAL ECHOCARDIOGRAM (TEE);  Surgeon: Laurey Moralealton S McLean, MD;  [facility];  Service:  Cardiovascular;  Laterality: N/A;    Current Outpatient Prescriptions  Medication Sig Dispense Refill  . aspirin 81 MG tablet Take 2 tablets by mouth daily    . atorvastatin (LIPITOR) 40 MG tablet Take 1 tablet (40 mg total) by mouth daily at 6 PM. 30 tablet 0  . hydrochlorothiazide (HYDRODIURIL) 25 MG tablet Take 25 mg by mouth daily.    [name]. lisinopril (PRINIVIL,ZESTRIL) 10 MG tablet Take 10 mg by mouth daily.     No current facility-administered medications for this visit.    Allergies:   Review of patient's allergies indicates no known allergies.   Social History:  The patient  reports that he has never smoked. He has never used smokeless tobacco. He reports that he does not drink alcohol or use illicit drugs.   Family History:  The patient's family history includes ALS in his father; COPD in his mother; Heart failure in his mother; Skin cancer in his mother.   ROS:  Please see the history of present illness.  All other systems are reviewed and negative.   PHYSICAL EXAM: VS:  BP 150/80 mmHg  Pulse 89  Ht 6\' 2"  (1.88 m)  Wt 182 lb 12.8 oz (82.918 kg)  BMI 23.46 kg/m2  SpO2 96% , BMI Body mass index is 23.46 kg/(m^2). GEN: Well nourished, well developed, physically fit male in no acute distress HEENT: normal Neck: no JVD, no masses. No carotid bruits Cardiac: RRR without murmur or gallop                Respiratory:  clear to  auscultation bilaterally, normal work of breathing GI: soft, nontender, nondistended, + BS MS: no deformity or atrophy Ext: no pretibial edema, pedal pulses 2+= bilaterally Skin: warm and dry, no rash Neuro:  Strength and sensation are intact Psych: euthymic mood, full affect  EKG:  EKG is not ordered today.  Recent Labs: 10/26/2015: Russell 18; Hemoglobin 15.3; Platelets 177; TSH 2.885 10/28/2015: BUN 14; Creatinine, Ser 1.00; Potassium 3.6; Sodium 138   Lipid Panel     Component Value Date/Time   CHOL 206* 10/27/2015 0317   TRIG 119 10/27/2015 0317     HDL 40* 10/27/2015 0317   CHOLHDL 5.2 10/27/2015 0317   VLDL 24 10/27/2015 0317   LDLCALC 142* 10/27/2015 0317      Wt Readings from Last 3 Encounters:  10/31/15 182 lb 12.8 oz (82.918 kg)  10/26/15 175 lb (79.379 kg)  08/13/15 182 lb (82.555 kg)     Cardiac Studies Reviewed: TEE: Study Conclusions  - Left ventricle: The cavity size was normal. Wall thickness was normal. Systolic function was normal. The estimated ejection fraction was in the range of 55% to 60%. Wall motion was normal; there were no regional wall motion abnormalities. - Aortic valve: Trileaflet. There was no stenosis. There was mild regurgitation. 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 - Mitral valve: There was mild regurgitation. - Left atrium: No evidence of thrombus in the atrial cavity or appendage. - Right ventricle: The cavity size was normal. Systolic function was normal. - Right atrium: No evidence of thrombus in the atrial cavity or appendage. - Atrial septum: There was a moderate PFO with a markedly positive bubble study. - Tricuspid valve: Peak RV-RA gradient 20 mmHg.  ASSESSMENT AND PLAN: PFO with cryptogenic stroke: Labs, imaging studies, and hospital records reviewed from the patient's recent hospitalization. TEE images are personally reviewed, and demonstrate dilatation of the aorta at the level of the sinuses of valsalva. 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 We discussed the proposed stroke pathophysiology when associated with PFO. We reviewed the common incidence of PFO in the general population, increased incidence in cryptogenic stroke, and other potential etiologies of stroke. We reviewed treatment options with antiplatelet/medical therapy versus consideration of transcatheter closure. I reviewed the risks, indications, and  alternatives to transcatheter closure with the patient. Specific risks include bleeding, infection, device embolization, stroke, cardiac perforation, tamponade, arrhythmia, MI, and late device erosion. He understands these serious risks occur at low incidence of < 1%.   The patient has a strong desire for transcatheter PFO closure. 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 We had extensive discussion about timing, and he wants to move forward as soon as possible. I advised that we consider waiting for approximately 3 months to monitor for paroxysmal atrial fibrillation on his implantable loop recorder which would also allow him to complete the Parfait trial in which he is participating. Despite this discussion, for personal reasons and concern over risk of recurrent stroke, he would like to move forward with transcatheter PFO closure as soon as possible.  Current medicines are reviewed with the patient today.  The patient does not have concerns regarding medicines.  Labs/ tests ordered today include:  No orders of the defined types were placed in this encounter.    Enzo Bi, MD  11/02/2015 9:39  AM    [facility] [address], [place], [place]  [address] Phone: [phone]; Fax: [phone]

## 2015-11-14 NOTE — Progress Notes (Signed)
Arrived to room 6C02 per stretcher.  Denies complaints.  Rt groin level 0.  Oriented to room.  Family at bedside.

## 2015-11-14 NOTE — Progress Notes (Signed)
Site area: 3 rt fv sheaths Site Prior to Removal:  Level 0 Pressure Applied For:  25 minutes Manual:   yes Patient Status During Pull:  stable Post Pull Site:  Level  0 Post Pull Instructions Given:  yes Post Pull Pulses Present: yes Dressing Applied:  Small tegaderm Bedrest begins @   1620 Comments:

## 2015-11-14 NOTE — Interval H&P Note (Signed)
History and Physical Interval Note:  11/14/2015 12:48 PM  Fred Russell  has presented today for surgery, with the diagnosis of pfo  The various methods of treatment have been discussed with the patient and family. After consideration of risks, benefits and other options for treatment, the patient has consented to  Procedure(s): PFO Closure (N/A) as a surgical intervention .  The patient's history has been reviewed, patient examined, no change in status, stable for surgery.  I have reviewed the patient's chart and labs.  Questions were answered to the patient's satisfaction.     Tonny Bollmanooper, Lasharn Bufkin

## 2015-11-14 NOTE — Patient Outreach (Signed)
[facility]  11/14/2015  Marlowe AltJeff W Giron [birth year] [id number]   EMMI-Stroke -red on dashboard(Went to follow up visit-no). Telephone call to patient; left message on voice mail requesting call back.  Plan:  Will follow up. Colleen CanLinda Orrie Schubert, RN BSN CCM Care Management Coordinator [facility] Care Management  [phone]

## 2015-11-15 ENCOUNTER — Ambulatory Visit (HOSPITAL_BASED_OUTPATIENT_CLINIC_OR_DEPARTMENT_OTHER): Payer: BLUE CROSS/BLUE SHIELD

## 2015-11-15 ENCOUNTER — Ambulatory Visit (HOSPITAL_COMMUNITY): Payer: BLUE CROSS/BLUE SHIELD

## 2015-11-15 ENCOUNTER — Encounter (HOSPITAL_COMMUNITY): Payer: Self-pay | Admitting: Nurse Practitioner

## 2015-11-15 DIAGNOSIS — Q211 Atrial septal defect: Secondary | ICD-10-CM

## 2015-11-15 DIAGNOSIS — I639 Cerebral infarction, unspecified: Secondary | ICD-10-CM | POA: Diagnosis present

## 2015-11-15 NOTE — Discharge Instructions (Signed)
**  PLEASE REMEMBER TO BRING ALL OF YOUR MEDICATIONS TO EACH OF YOUR FOLLOW-UP OFFICE VISITS.  Groin Site Care Refer to this sheet in the next few weeks. These instructions provide you with information on caring for yourself after your procedure. Your caregiver may also give you more specific instructions. Your treatment has been planned according to current medical practices, but problems sometimes occur. Call your caregiver if you have any problems or questions after your procedure. HOME CARE INSTRUCTIONS  You may shower 24 hours after the procedure. Remove the bandage (dressing) and gently wash the site with plain soap and water. Gently pat the site dry.   Do not apply powder or lotion to the site.   Do not sit in a bathtub, swimming pool, or whirlpool for 5 to 7 days.   No bending, squatting, or lifting anything over 10 pounds (4.5 kg) as directed by your caregiver.   Inspect the site at least twice daily.   Do not drive home if you are discharged the same day of the procedure. Have someone else drive you.   You may drive 24 hours after the procedure unless otherwise instructed by your caregiver.  What to expect:  Any bruising will usually fade within 1 to 2 weeks.   Blood that collects in the tissue (hematoma) may be painful to the touch. It should usually decrease in size and tenderness within 1 to 2 weeks.  SEEK IMMEDIATE MEDICAL CARE IF:  You have unusual pain at the groin site or down the affected leg.   You have redness, warmth, swelling, or pain at the groin site.   You have drainage (other than a small amount of blood on the dressing).   You have chills.   You have a fever or persistent symptoms for more than 72 hours.   You have a fever and your symptoms suddenly get worse.   Your leg becomes pale, cool, tingly, or numb.  You have heavy bleeding from the site. Hold pressure on the site. .     10 Habits of Highly Healthy People  [place] wants to help you get  well and stay well.  Live a longer, healthier life by practicing healthy habits every day.  1.  Visit your primary care provider regularly. 2.  Make time for family and friends.  Healthy relationships are important. 3.  Take medications as directed by your provider. 4.  Maintain a healthy weight and a trim waistline. 5.  Eat healthy meals and snacks, rich in fruits, vegetables, whole grains, and lean proteins. 6.  Get moving every day - aim for 150 minutes of moderate physical activity each week. 7.  Don't smoke. 8.  Avoid alcohol or drink in moderation. 9.  Manage stress through meditation or mindful relaxation. 10.  Get seven to nine hours of quality sleep each night.  Want more information on healthy habits?  To learn more about these and other healthy habits, visit [email or web address]. _____________

## 2015-11-15 NOTE — Progress Notes (Signed)
  Echocardiogram 2D Echocardiogram has been performed.  Janalyn HarderWest, Balinda Heacock R 11/15/2015, 8:53 AM

## 2015-11-15 NOTE — Progress Notes (Signed)
SUBJECTIVE:  No pain. No SOB   PHYSICAL EXAM Filed Vitals:   11/14/15 1943 11/14/15 2000 11/15/15 0449 11/15/15 0759  BP: 130/64 134/76 152/87 143/83  Pulse: 64 62 75 78  Temp: 98.1 F (36.7 C)  97.4 F (36.3 C) 97.7 F (36.5 C)  TempSrc: Oral  Oral Oral  Resp: Height:      Weight:   181 lb 10.5 oz (82.4 kg)   SpO2: 99% 98% 98% 99%   General:  No distress Lungs:  Clear Heart:  RRR Abdomen:  Positive bowel sounds, no rebound no guarding Extremities:  Right femoral site without bruising or bleeding.   LABS: No results found for: TROPONINI Results for orders placed or performed during the hospital encounter of 11/14/15 (from the past 24 hour(s))  Basic metabolic panel     Status: None   Collection Time: 11/14/15 10:27 AM  Result Value Ref Range   Sodium 139 135 - 145 mmol/L   Potassium 3.5 3.5 - 5.1 mmol/L   Chloride 103 101 - 111 mmol/L   CO2 26 22 - 32 mmol/L   Glucose, Bld 97 65 - 99 mg/dL   BUN 15 6 - 20 mg/dL   Creatinine, Ser 1.61 0.61 - 1.24 mg/dL   Calcium 9.3 8.9 - 09.6 mg/dL   GFR calc non Af Amer >60 >60 mL/min   GFR calc Af Amer >60 >60 mL/min   Anion gap 10 5 - 15  Protime-INR     Status: None   Collection Time: 11/14/15 10:27 AM  Result Value Ref Range   Prothrombin Time 14.2 11.6 - 15.2 seconds   INR 1.08 0.00 - 1.49  CBC     Status: Abnormal   Collection Time: 11/14/15 10:27 AM  Result Value Ref Range   WBC 5.6 4.0 - 10.5 K/uL   RBC 4.17 (L) 4.22 - 5.81 MIL/uL   Hemoglobin 13.2 13.0 - 17.0 g/dL   HCT 04.5 (L) 40.9 - 81.1 %   MCV 92.8 78.0 - 100.0 fL   MCH 31.7 26.0 - 34.0 pg   MCHC 34.1 30.0 - 36.0 g/dL   RDW 91.4 78.2 - 95.6 %   Platelets 141 (L) 150 - 400 K/uL  POCT Activated clotting time     Status: None   Collection Time: 11/14/15  1:25 PM  Result Value Ref Range   Activated Clotting Time 198 seconds  POCT Activated clotting time     Status: None   Collection Time: 11/14/15  1:39 PM  Result Value Ref Range   Activated Clotting Time 240 seconds  I-STAT 3, arterial blood gas (G3+)     Status: Abnormal   Collection Time: 11/14/15  1:40 PM  Result Value Ref Range   pH, Arterial 7.435 7.350 - 7.450   pCO2 arterial 45.0 35.0 - 45.0 mmHg   pO2, Arterial 78.0 (L) 80.0 - 100.0 mmHg   Bicarbonate 30.2 (H) 20.0 - 24.0 mEq/L   TCO2 32 0 - 100 mmol/L   O2 Saturation 96.0 %   Acid-Base Excess 5.0 (H) 0.0 - 2.0 mmol/L   Patient temperature HIDE    Sample type ARTERIAL   POCT Activated clotting time     Status: None   Collection Time: 11/14/15  2:15 PM  Result Value Ref Range   Activated Clotting Time 214 seconds  POCT Activated clotting time     Status: None   Collection Time: 11/14/15  2:51 PM  Result Value  Ref Range   Activated Clotting Time 193 seconds  POCT Activated clotting time     Status: None   Collection Time: 11/14/15  3:32 PM  Result Value Ref Range   Activated Clotting Time 173 seconds    Intake/Output Summary (Last 24 hours) at 11/15/15 [id number] Last data filed at 11/15/15 0800  Gross per 24 hour  Intake    440 ml  Output    400 ml  Net     40 ml     ASSESSMENT AND PLAN:  Active Problems:   PFO (patent foramen ovale) Successful transcatheter PFO closure using a 25 mm Amplatzer PFO occluder with no evidence of residual right to left shunt by agitated saline study at the completion of the procedure.  OK to go home after echo.  CXR results pending.   Home on previous meds.   Fayrene FearingJames [facility] 11/15/2015 8:03 AM

## 2015-11-15 NOTE — Discharge Summary (Signed)
Discharge Summary   Patient ID: Fred Russell,  MRN: [id number], DOB/AGE: [birth year] 64 y.o.  Admit date: 11/14/2015 Discharge date: 11/15/2015  Primary Care Provider: Milana ObeyKNOWLTON,STEPHEN D Primary Cardiologist: Judie PetitM. Excell Seltzerooper, MD   Discharge Diagnoses    Principal Problem:   PFO (patent foramen ovale) Active Problems:   Essential hypertension   Hyperlipidemia   Cryptogenic stroke (HCC)   Allergies No Known Allergies  Diagnostic Studies/Procedures    PFO Closure 12.30.2016  There is a PFO. There is not an atrial septal aneurysm present. Agitated saline study indicates right-to-left shunt at rest. Balloon sizing was not performed. Intracardiac echo used for procedural guidance. 25 mm Amplatzer PFO Occluder was used to close the defect. Agitated saline study indicates no right-to-left shunt post closure. There were no immediate procedural complications. _____________  2D Echocardiogram 12.31.2016  No residual flow across the PFO.  Full report pending. _____________   History of Present Illness  64 year old male with prior history of hypertension who was admitted to the hospital in early December with aphasia, confusion, and facial droop. A code stroke was called and he was evaluated by neurology and subsequently found to have a right frontal stroke. Symptoms improved and TPA was not required. He underwent extensive evaluation and was not found to have evidence of hypercoagulability, LV dysfunction, valvular abnormalities, or extracranial stenoses. A transesophageal echocardiogram demonstrated a patent foramen ovale with spontaneous right to left shunting. Patient was enrolled in the PARFAIT clinical trial, placed on aspirin and Plavix, and subsequently underwent placement of a [employer] Reveal LINQ implantable loop recorder. He followed up with Dr. Excell Seltzerooper in clinic on the summer 16th for consideration of PFO closure. Patient wished to proceed and arrangements were made for this to be  performed as an outpatient.  Hospital Course   Consultants: None   Fred Russell [facility]2015. Intracardiac echocardiography revealed a patent foreman ovale with right to left shunting at rest. There was no evidence of an atrial septal defect. The PFO was successfully closed using a 25 mm Amplatzer PFO occluder and instillation of agitated saline post device placement did not show any residual right to left shunting. There were no immediate competitions.  Postprocedure, Fred Russell has been ambulatory without difficulty. 2-D echocardiogram performed this morning shows no residual flow across the PFO.  He will be discharged home today in good condition and we will arrange for follow-up within the next 2-4 weeks. _____________  Discharge Vitals Blood pressure 143/83, pulse 78, temperature 97.7 F (36.5 C), temperature source Oral, resp. rate 18, height 6\' 2"  (1.88 m), weight 181 lb 10.5 oz (82.4 kg), SpO2 99 %.  Filed Weights   11/14/15 0958 11/15/15 0449  Weight: 178 lb (80.74 kg) 181 lb 10.5 oz (82.4 kg)    Labs     CBC  Recent Labs  11/14/15 1027  WBC 5.6  HGB 13.2  HCT 38.7*  MCV 92.8  PLT 141*   Basic Metabolic Panel  Recent Labs  11/14/15 1027  NA 139  K 3.5  CL 103  CO2 26  GLUCOSE 97  BUN 15  CREATININE 0.96  CALCIUM 9.3   Disposition   Pt is being discharged home today in good condition.  Follow-up Plans & Appointments        Follow-up Information    Follow up with Lesly DukesWILLIS,CHARLES KEITH, MD On 12/04/2015.   Specialty:  Neurology   Why:  9:30 PM   Contact information:  [address] [place] [address] [phone]       Follow up with Tonny Bollman, MD In 4 weeks.   Specialty:  Cardiology   Why:  we will arrange for follow-up and contact you.   Contact information:   [address] [place] [address] [phone]        Discharge  Medications     Medication List    TAKE these medications        aspirin 81 MG tablet  Take 81 mg by mouth daily.     atorvastatin 40 MG tablet  Commonly known as:  LIPITOR  Take 1 tablet (40 mg total) by mouth daily at 6 PM.     clopidogrel 75 MG tablet  Commonly known as:  PLAVIX  Take 1 tablet (75 mg total) by mouth daily.     hydrochlorothiazide 25 MG tablet  Commonly known as:  HYDRODIURIL  Take 25 mg by mouth daily.     lisinopril 10 MG tablet  Commonly known as:  PRINIVIL,ZESTRIL  Take 10 mg by mouth daily.         Outstanding Labs/Studies   None  Duration of Discharge Encounter   Greater than 30 minutes including physician time.  Signed, Nicolasa Ducking NP 11/15/2015, 9:34 AM  Patient seen and examined.  Plan as discussed in my rounding note for today and outlined above. Fayrene Fearing [facility]  11/15/2015  11:18 AM

## 2015-11-18 ENCOUNTER — Encounter (HOSPITAL_COMMUNITY): Payer: Self-pay | Admitting: Cardiovascular Disease

## 2015-11-18 ENCOUNTER — Other Ambulatory Visit: Payer: Self-pay | Admitting: *Deleted

## 2015-11-18 NOTE — Patient Outreach (Signed)
[facility] Care Management  11/18/2015  Fred Russell 22-Dec-1950 [id number]   EMMI-stroke follow up call for red response on EMMI stroke dashboard (went to follow up appointment-no). Patient had hospital stay from 12/11-12/14/2016 with diagnoses CVA and PFO.    Telephone call to patient who was advised of reason for call and of expectations of being enrolled in EMMI-stroke program. Patient states he has been to follow up appointment to primary care physician since discharge from hospital 10/29/2015.  Also states PFO closure was done 11/14/2015. States he has scheduled appointment with neurologist 12/04/15 and appointment with cardiologist end of January 2017.   States he is dong well and having no stroke symptoms. States facial droop that he had on admission completely resolved. States he continues to take ASA & Plavix as prescribed as well as other medications. States he is aware of stroke symptoms and action plan if symptoms occur. 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 States strong family support with medical background. States he appreciates services provided by EMMI-Stroke program but wishes to opt out of program today.     Plan:  Send MD closure letter to neurologist. Notify care management assistant to close case.  Colleen CanLinda Dewanda Fennema, RN BSN CCM Care Management Coordinator [facility] Care Management  [phone]

## 2015-11-19 ENCOUNTER — Telehealth: Payer: Self-pay | Admitting: Neurology

## 2015-11-19 NOTE — Telephone Encounter (Signed)
I spoke with Fred Russell.  I let him know that I needed to schedule an MRI for the PARFAIT study.  He expressed some concern with regards to having an MRI due to him having a loop recorder as well as a PFO closure.  I relayed to him the information that was forwarded to me from [place]:   The patient has a loop recorder. Per Dr. Roda ShuttersXu Loop recorder is compatible with MRI, so it is not a problem.  The patient underwent a PFO closure surgery. Per Dr. Excell Seltzerooper, I looked into MRI safety of the Amplatzer device and looks like it's compatible as long as MRI is done on a 3 Tesla or less scanner. There do not appear to be any issues with ferromagnetic force or device heating.   After relaying this information, the patient felt comfortable enough for me to schedule an MRI appointment for him.  The patient requested that I schedule the appointment in the morning.  The patient also stated that he was not claustrophobic. I told him that I would schedule the appointment in the morning.  I told him once this was done, I would call back and schedule his 28 day visit and his 56 day visit.

## 2015-11-20 ENCOUNTER — Other Ambulatory Visit: Payer: Self-pay | Admitting: Neurology

## 2015-11-20 DIAGNOSIS — I6350 Cerebral infarction due to unspecified occlusion or stenosis of unspecified cerebral artery: Secondary | ICD-10-CM

## 2015-11-21 ENCOUNTER — Encounter: Payer: Self-pay | Admitting: *Deleted

## 2015-11-23 ENCOUNTER — Telehealth: Payer: Self-pay

## 2015-11-23 NOTE — Telephone Encounter (Signed)
Spoke to the subject and informed about office closure on Monday and call back to pt on Tuesday to reschedule.

## 2015-11-24 ENCOUNTER — Ambulatory Visit (HOSPITAL_COMMUNITY)
Admission: RE | Admit: 2015-11-24 | Discharge: 2015-11-24 | Disposition: A | Discharge status: Home / Self Care | Payer: BLUE CROSS/BLUE SHIELD | Source: Ambulatory Visit | Attending: Neurology | Admitting: Neurology

## 2015-11-24 ENCOUNTER — Encounter: Payer: BLUE CROSS/BLUE SHIELD | Admitting: Neurology

## 2015-11-24 DIAGNOSIS — I6350 Cerebral infarction due to unspecified occlusion or stenosis of unspecified cerebral artery: Secondary | ICD-10-CM

## 2015-11-25 ENCOUNTER — Other Ambulatory Visit (INDEPENDENT_AMBULATORY_CARE_PROVIDER_SITE_OTHER): Payer: Self-pay

## 2015-11-25 ENCOUNTER — Other Ambulatory Visit: Payer: Self-pay | Admitting: Neurology

## 2015-11-25 DIAGNOSIS — Z0289 Encounter for other administrative examinations: Secondary | ICD-10-CM

## 2015-11-25 DIAGNOSIS — I63411 Cerebral infarction due to embolism of right middle cerebral artery: Secondary | ICD-10-CM

## 2015-11-25 NOTE — Progress Notes (Signed)
Pt was seen for PAFAIT 28 day visit. He has stopped PAFAIT trial medications after about 3 weeks due to PFO closure surgery and on plavix. Currently he is on plavix and lipitor. No complains and feeling good. He had PFO closure with Dr. Excell Seltzerooper on 11/14/15.   On detailed history taking, he did have 4 surgeries at right shoulder in Oct and Nov 2016 and limited ROM with edema at that time. With his right MCA embolic stroke and PFO, I recommend UE venous doppler to rule out DVT.   He also had LE venous doppler in 10/2015 showed left posterior tibial vein fibrin stranding, suggesting chronic DVT. With his embolic stroke and PFO, chronic DVT, and questionable UE DVT, I also recommend hypercoagulable work up to rule out hypercoagulability.   Pt agrees with above testing. Orders done.   Marvel PlanJindong Beuna Bolding, MD PhD Stroke Neurology 11/25/2015 1:35 PM  Orders Placed This Encounter  Procedures  . Hypercoagulable panel, comprehensive    Standing Status: Future     Number of Occurrences:      Standing Expiration Date: 11/24/2016  . Urinalysis with Reflex Microscopic    Bill to [facility] research, do not bill to pt insurance. Thanks    Standing Status: Future     Number of Occurrences:      Standing Expiration Date: 11/24/2016

## 2015-11-26 LAB — URINALYSIS, ROUTINE W REFLEX MICROSCOPIC
Bilirubin, UA: NEGATIVE
Glucose, UA: NEGATIVE
Ketones, UA: NEGATIVE
LEUKOCYTES UA: NEGATIVE
Nitrite, UA: NEGATIVE
PH UA: 7 (ref 5.0–7.5)
PROTEIN UA: NEGATIVE
RBC, UA: NEGATIVE
Specific Gravity, UA: 1.016 (ref 1.005–1.030)
Urobilinogen, Ur: 0.2 mg/dL (ref 0.2–1.0)

## 2015-11-27 ENCOUNTER — Ambulatory Visit (INDEPENDENT_AMBULATORY_CARE_PROVIDER_SITE_OTHER): Payer: BLUE CROSS/BLUE SHIELD | Admitting: *Deleted

## 2015-11-27 DIAGNOSIS — I6330 Cerebral infarction due to thrombosis of unspecified cerebral artery: Secondary | ICD-10-CM

## 2015-12-01 NOTE — Progress Notes (Signed)
Carelink Summary Report / Loop Recorder 

## 2015-12-04 ENCOUNTER — Institutional Professional Consult (permissible substitution): Payer: BLUE CROSS/BLUE SHIELD | Admitting: Neurology

## 2015-12-05 ENCOUNTER — Encounter: Payer: Self-pay | Admitting: Internal Medicine

## 2015-12-05 ENCOUNTER — Encounter: Payer: Self-pay | Admitting: Physician Assistant

## 2015-12-05 ENCOUNTER — Telehealth: Payer: Self-pay | Admitting: *Deleted

## 2015-12-05 DIAGNOSIS — I480 Paroxysmal atrial fibrillation: Secondary | ICD-10-CM | POA: Insufficient documentation

## 2015-12-05 LAB — HYPERCOAGULABLE PANEL, COMPREHENSIVE
APTT: 23.6 s
AT III ACT/NOR PPP CHRO: 93 %
Act. Prt C Resist w/FV Defic.: 2.6 ratio
Anticardiolipin Ab, IgM: 13 [MPL'U]
Beta-2 Glycoprotein I, IgA: <10 SAU
DRVVT Confirm Seconds: 36.4 s
DRVVT Ratio: 1.1 ratio
DRVVT SCREEN SECONDS: 45.6 s — AB
FACTOR VII ANTIGEN: 63 %
Factor VIII Activity: 217 % — ABNORMAL HIGH
Hexagonal Phospholipid Neutral: 6 s
Homocysteine: 10.4 umol/L
PROT C AG ACT/NOR PPP IMM: 80 %
PROTEIN C AG/FVII AG RATIO: 1.3 ratio
Prot S Ag Act/Nor PPP Imm: 74 %
Protein S Ag/FVII Ag Ratio**: 1.2 ratio

## 2015-12-05 LAB — CUP PACEART REMOTE DEVICE CHECK: MDC IDC SESS DTM: 20170112223658

## 2015-12-05 MED ORDER — RIVAROXABAN 20 MG PO TABS: 20.0000 mg | ORAL_TABLET | Freq: Every day | ORAL | Status: DC

## 2015-12-05 NOTE — Telephone Encounter (Signed)
Called patient to make him aware that his LINQ transmitted an episode of atrial fibrillation, 42 minutes in duration, from 12/03/15.  Advised patient that Dr. Johney Frame and Dr. Excell Seltzer recommended that he STOP Plavix, CONTINUE taking ASA  as prescribed, and START Xarelto  (1 tab) once daily.  Patient is agreeable to this plan.  He will come to the [facility]. Office this evening to pick up samples.  Prescription sent electronically to patient's preferred pharmacy.  Patient is agreeable to ROV with Francis Dowse, PA on 12/10/15 at 9:00am to discuss atrial fibrillation and plan of care.  Patient verbalizes understanding of all instructions and denies additional questions or concerns at this time.  Routed to Dr. Excell Seltzer, Dr. Johney Frame, and Dr. Pearlean Brownie for review.

## 2015-12-05 NOTE — Telephone Encounter (Signed)
Patient spoke with Dr. Johney Frame when he came to pick up Xarelto samples.  Per Dr. Johney Frame, patient can wait to follow-up in office until February, when he is due to see Dr. Excell Seltzer.  Canceled appointment with Renee.  Patient is aware of follow-up plan and is aware that he will be contacted to schedule a follow-up appointment with Dr. Excell Seltzer in early February.  Patient denies any additional questions or concerns at this time.

## 2015-12-06 NOTE — Telephone Encounter (Signed)
I spoke at length with the patient when he came to pick up xarelto samples in the office today.  Asymptomatic with nocturnal AF (lated 44 minutes).  I have also spoken with Dr Excell Seltzer.  Given recent PFO closure, will continue ASA in addition to xarelto and stop plavix. Will notify neurology. Pt understands implications of afib diagnosis at length and is willing to start anticoagulation.  He does not feel that visit with PA next week would offer additional benefit.  He will keep scheduled follow-up with Dr Excell Seltzer. I will follow AF remotely and see as needed going forward.  Fayrene Fearing Jahmai Finelli,MD

## 2015-12-07 NOTE — Telephone Encounter (Signed)
Thank you for the message.   Fred Russell

## 2015-12-08 ENCOUNTER — Ambulatory Visit (HOSPITAL_COMMUNITY): Payer: BLUE CROSS/BLUE SHIELD

## 2015-12-09 NOTE — Telephone Encounter (Signed)
thanks

## 2015-12-10 ENCOUNTER — Encounter: Payer: BLUE CROSS/BLUE SHIELD | Admitting: Physician Assistant

## 2015-12-10 ENCOUNTER — Telehealth: Payer: Self-pay | Admitting: Neurology

## 2015-12-11 NOTE — Telephone Encounter (Signed)
I spoke with Fred Russell.  I rescheduled his Research visit for 12/29/15 at 1pm.

## 2015-12-16 ENCOUNTER — Telehealth: Payer: Self-pay | Admitting: Cardiovascular Disease

## 2015-12-16 NOTE — Telephone Encounter (Signed)
Follow Up ° °Pt returned call//  °

## 2015-12-22 ENCOUNTER — Encounter: Payer: BLUE CROSS/BLUE SHIELD | Admitting: Neurology

## 2015-12-22 ENCOUNTER — Other Ambulatory Visit (HOSPITAL_COMMUNITY): Payer: BLUE CROSS/BLUE SHIELD

## 2015-12-26 ENCOUNTER — Telehealth: Payer: Self-pay | Admitting: Neurology

## 2015-12-26 ENCOUNTER — Other Ambulatory Visit: Payer: Self-pay | Admitting: Cardiovascular Disease

## 2015-12-26 DIAGNOSIS — I480 Paroxysmal atrial fibrillation: Secondary | ICD-10-CM

## 2015-12-26 NOTE — Telephone Encounter (Signed)
I spoke with Fred Russell.  I reminded him of his research visit scheduled for Monday, Feb. 13th 2017 at 1pm.  He confirmed that he will be at his appointment.

## 2015-12-29 ENCOUNTER — Ambulatory Visit (HOSPITAL_COMMUNITY): Payer: BLUE CROSS/BLUE SHIELD | Attending: Internal Medicine

## 2015-12-29 ENCOUNTER — Other Ambulatory Visit: Payer: Self-pay

## 2015-12-29 ENCOUNTER — Encounter: Payer: Self-pay | Admitting: Cardiovascular Disease

## 2015-12-29 ENCOUNTER — Ambulatory Visit (INDEPENDENT_AMBULATORY_CARE_PROVIDER_SITE_OTHER): Payer: BLUE CROSS/BLUE SHIELD | Admitting: *Deleted

## 2015-12-29 ENCOUNTER — Ambulatory Visit (INDEPENDENT_AMBULATORY_CARE_PROVIDER_SITE_OTHER): Payer: BLUE CROSS/BLUE SHIELD | Admitting: Cardiovascular Disease

## 2015-12-29 ENCOUNTER — Encounter (INDEPENDENT_AMBULATORY_CARE_PROVIDER_SITE_OTHER): Payer: Self-pay | Admitting: Neurology

## 2015-12-29 VITALS — BP 134/80 | HR 62 | Ht 74.0 in | Wt 188.8 lb

## 2015-12-29 DIAGNOSIS — Q2112 Patent foramen ovale: Secondary | ICD-10-CM

## 2015-12-29 DIAGNOSIS — I77810 Thoracic aortic ectasia: Secondary | ICD-10-CM | POA: Diagnosis not present

## 2015-12-29 DIAGNOSIS — I351 Nonrheumatic aortic (valve) insufficiency: Secondary | ICD-10-CM | POA: Insufficient documentation

## 2015-12-29 DIAGNOSIS — I6330 Cerebral infarction due to thrombosis of unspecified cerebral artery: Secondary | ICD-10-CM | POA: Diagnosis not present

## 2015-12-29 DIAGNOSIS — Z006 Encounter for examination for normal comparison and control in clinical research program: Secondary | ICD-10-CM

## 2015-12-29 DIAGNOSIS — I480 Paroxysmal atrial fibrillation: Secondary | ICD-10-CM | POA: Insufficient documentation

## 2015-12-29 DIAGNOSIS — I340 Nonrheumatic mitral (valve) insufficiency: Secondary | ICD-10-CM | POA: Insufficient documentation

## 2015-12-29 DIAGNOSIS — I10 Essential (primary) hypertension: Secondary | ICD-10-CM | POA: Insufficient documentation

## 2015-12-29 DIAGNOSIS — Q211 Atrial septal defect: Secondary | ICD-10-CM | POA: Diagnosis not present

## 2015-12-29 DIAGNOSIS — Z0289 Encounter for other administrative examinations: Secondary | ICD-10-CM

## 2015-12-29 NOTE — Patient Instructions (Addendum)
Medication Instructions:  Your physician recommends that you continue on your current medications as directed. Please refer to the Current Medication list given to you today.  Labwork: Your physician recommends that you return for lab work in: 6 MONTHS (BMP)--will arrange closer to time  Testing/Procedures: Your physician has requested that you have a LIMITED echocardiogram with bubble study in 6 MONTHS. Echocardiography is a painless test that uses sound waves to create images of your heart. It provides your doctor with information about the size and shape of your heart and how well your heart's chambers and valves are working. This procedure takes approximately one hour. There are no restrictions for this procedure.  Your physician has requested that you have GATED cardiac CT in 6 MONTHS. Cardiac computed tomography (CT) is a painless test that uses an x-ray machine to take clear, detailed pictures of your heart. For further information please visit [email or web address] Please follow instruction sheet as given.  Follow-Up: Your physician wants you to follow-up in: 6 MONTHS with Dr Excell Seltzer.  You will receive a reminder letter in the mail two months in advance. If you don't receive a letter, please call our office to schedule the follow-up appointment.   Any Other Special Instructions Will Be Listed Below (If Applicable).     If you need a refill on your cardiac medications before your next appointment, please call your pharmacy.

## 2015-12-29 NOTE — Progress Notes (Signed)
Cardiology Office Note Date:  12/29/2015   ID:  Fred Russell 1951-04-13, MRN [id number]  PCP:  Milana Obey, MD  Cardiologist:  Tonny Bollman, MD    Chief Complaint  Patient presents with  . Follow-up    PFO closure     History of Present Illness: Fred Russell is a 65 y.o. male who presents for follow-up evaluation. The patient is here for 30 day follow-up after undergoing transcatheter PFO closure 11/14/2015. He presented December 12 with a fascia, confusion, and facial droop. He was diagnosed with a right frontal stroke. TEE demonstrated a PFO with spontaneous right to left shunting. He elected to undergo transcatheter closure which was performed December 30 without complication. He presents today for follow-up evaluation. In the interim, his implantable loop recorder has demonstrated paroxysmal atrial fibrillation. He has been started on Xarelto 20 mg daily. He has discontinued Plavix. He remains on aspirin 81 mg.  He denies chest pain or shortness of breath. He is back to regular exercise. He has no further neurologic complaints.   Past Medical History  Diagnosis Date  . Essential hypertension   . Left foot drop 08/13/2015  . Cryptogenic stroke (HCC)     a. 10/2015-->TEE showed PFO w/ R->L Shunt, s/p closure;  b. s/p MDT Linq.  [name] PFO (patent foramen ovale)     a. 10/2015 s/p closure w/ 25 mm Amplatzer PFO occluder.    Past Surgical History  Procedure Laterality Date  . Rotator cuff repair      x3  . Ep implantable device N/A 10/28/2015    Procedure: Loop Recorder Insertion;  Surgeon: Hillis Range, MD;  Location: [facility] INVASIVE CV LAB;  Service: Cardiovascular;  Laterality: N/A;  . Tee without cardioversion N/A 10/28/2015    Procedure: TRANSESOPHAGEAL ECHOCARDIOGRAM (TEE);  Surgeon: Laurey Morale, MD;  Location: [facility] ENDOSCOPY;  Service: Cardiovascular;  Laterality: N/A;  . Cardiac catheterization N/A 11/14/2015    Procedure: PFO Closure;  Surgeon: Tonny Bollman, MD;  Location: [facility] INVASIVE CV LAB;  Service: Cardiovascular;  Laterality: N/A;    Current Outpatient Prescriptions  Medication Sig Dispense Refill  . aspirin 81 MG tablet Take 81 mg by mouth daily.     [name] atorvastatin (LIPITOR) 40 MG tablet Take 1 tablet (40 mg total) by mouth daily at 6 PM. 30 tablet 0  . hydrochlorothiazide (HYDRODIURIL) 25 MG tablet Take 25 mg by mouth daily.    [name] lisinopril (PRINIVIL,ZESTRIL) 20 MG tablet Take 20 mg by mouth daily.  2  . rivaroxaban (XARELTO) 20 MG TABS tablet Take 1 tablet (20 mg total) by mouth daily with supper. 30 tablet 11   No current facility-administered medications for this visit.    Allergies:   Review of patient's allergies indicates no known allergies.   Social History:  The patient  reports that he has never smoked. He has never used smokeless tobacco. He reports that he does not drink alcohol or use illicit drugs.   Family History:  The patient's  family history includes ALS in his father; COPD in his mother; Heart failure in his mother; Skin cancer in his mother.    ROS:  Please see the history of present illness.  All other systems are reviewed and negative.    PHYSICAL EXAM: VS:  BP 134/80 mmHg  Pulse 62  Ht  (1.88 m)  Wt 85.639 kg (188 lb 12.8 oz)  BMI 24.23 kg/m2  SpO2 98% , BMI Body mass index is  24.23 kg/(m^2). GEN: Well nourished, well developed, in no acute distress HEENT: normal Neck: no JVD, no masses. No carotid bruits Cardiac: RRR without murmur or gallop                Respiratory:  clear to auscultation bilaterally, normal work of breathing GI: soft, nontender, nondistended, + BS MS: no deformity or atrophy Ext: no pretibial edema, pedal pulses 2+= bilaterally Skin: warm and dry, no rash Neuro:  Strength and sensation are intact Psych: euthymic mood, full affect  EKG:  EKG is not ordered today.  Recent Labs: 10/26/2015: ALT 18; TSH 2.885 11/14/2015: BUN 15; Creatinine, Ser 0.96; Hemoglobin  13.2; Platelets 141*; Potassium 3.5; Sodium 139   Lipid Panel     Component Value Date/Time   CHOL 206* 10/27/2015 0317   TRIG 119 10/27/2015 0317   HDL 40* 10/27/2015 0317   CHOLHDL 5.2 10/27/2015 0317   VLDL 24 10/27/2015 0317   LDLCALC 142* 10/27/2015 0317      Wt Readings from Last 3 Encounters:  12/29/15 85.639 kg (188 lb 12.8 oz)  11/15/15 82.4 kg (181 lb 10.5 oz)  10/31/15 82.918 kg (182 lb 12.8 oz)     Cardiac Studies Reviewed: 2D Echo (today): Study Conclusions  - Left ventricle: The cavity size was normal. Wall thickness was normal. Systolic function was normal. The estimated ejection fraction was in the range of 50% to 55%. Doppler parameters are consistent with abnormal left ventricular relaxation (grade 1 diastolic dysfunction). - Aortic valve: There was trivial regurgitation. - Aorta: Aortic root is mildly dilated at 44 mm. Proximal ascending aorta is mildly dilated at 40 mm. - Mitral valve: There was mild regurgitation. - Atrial septum: No ASD present by color doppler  ASSESSMENT AND PLAN: 1.  PFO status post transcatheter closure: I have personally reviewed the patient's echo images. His atrial septal occluder appears to be in appropriate position and there is no evidence of pericardial effusion. Plan 6 month follow-up and repeat limited echocardiogram with agitated saline study.  2. Dilated aortic root: Recommend a gated CT angiogram of the chest for better evaluation of the patient's aortic anatomy. He has no family history of aortopathy or sudden death. Patient denies chest pain.  3. Essential hypertension: Blood pressure controlled on lisinopril and hydrochlorothiazide  4. Hyperlipidemia: Treated with atorvastatin  5. Paroxysmal atrial fibrillation: Treated appropriately with Xarelto and no bleeding complications reported.  Current medicines are reviewed with the patient today.  The patient does not have concerns regarding  medicines.  Labs/ tests ordered today include:  Orders Placed This Encounter  Procedures  . CT Heart Morp W/Cta Cor W/Score W/Ca W/Cm &/Or Wo/Cm  . ECHO BUBBLE STUDY    Disposition:   FU is months with a limited echo study and CTA of the chest  Signed, Tonny Bollman, MD  12/29/2015 2:03 PM    [facility] [address] [place], [place], [place]  [address] Phone: [phone]; Fax: [phone]

## 2015-12-29 NOTE — Progress Notes (Signed)
Carelink Summary Report / Loop Recorder 

## 2015-12-30 LAB — URINALYSIS, ROUTINE W REFLEX MICROSCOPIC
Bilirubin, UA: NEGATIVE
Glucose, UA: NEGATIVE
KETONES UA: NEGATIVE
LEUKOCYTES UA: NEGATIVE
NITRITE UA: NEGATIVE
PH UA: 7 (ref 5.0–7.5)
Protein, UA: NEGATIVE
RBC, UA: NEGATIVE
Urobilinogen, Ur: 0.2 mg/dL (ref 0.2–1.0)

## 2016-01-13 ENCOUNTER — Encounter (INDEPENDENT_AMBULATORY_CARE_PROVIDER_SITE_OTHER): Payer: Self-pay | Admitting: Neurology

## 2016-01-13 ENCOUNTER — Other Ambulatory Visit: Payer: Self-pay | Admitting: Neurology

## 2016-01-13 ENCOUNTER — Other Ambulatory Visit (INDEPENDENT_AMBULATORY_CARE_PROVIDER_SITE_OTHER): Payer: Self-pay

## 2016-01-13 DIAGNOSIS — I63019 Cerebral infarction due to thrombosis of unspecified vertebral artery: Secondary | ICD-10-CM

## 2016-01-13 DIAGNOSIS — Z0289 Encounter for other administrative examinations: Secondary | ICD-10-CM

## 2016-01-14 LAB — URINALYSIS, ROUTINE W REFLEX MICROSCOPIC
BILIRUBIN UA: NEGATIVE
GLUCOSE, UA: NEGATIVE
Ketones, UA: NEGATIVE
LEUKOCYTES UA: NEGATIVE
Nitrite, UA: NEGATIVE
PH UA: 7 (ref 5.0–7.5)
PROTEIN UA: NEGATIVE
RBC UA: NEGATIVE
SPEC GRAV UA: 1.019 (ref 1.005–1.030)
UUROB: 0.2 mg/dL (ref 0.2–1.0)

## 2016-01-26 ENCOUNTER — Ambulatory Visit (INDEPENDENT_AMBULATORY_CARE_PROVIDER_SITE_OTHER): Payer: BLUE CROSS/BLUE SHIELD | Admitting: *Deleted

## 2016-01-26 DIAGNOSIS — I6330 Cerebral infarction due to thrombosis of unspecified cerebral artery: Secondary | ICD-10-CM

## 2016-01-27 NOTE — Progress Notes (Signed)
Carelink Summary Report / Loop Recorder 

## 2016-02-25 ENCOUNTER — Ambulatory Visit (INDEPENDENT_AMBULATORY_CARE_PROVIDER_SITE_OTHER): Payer: BLUE CROSS/BLUE SHIELD | Admitting: *Deleted

## 2016-02-25 DIAGNOSIS — I6330 Cerebral infarction due to thrombosis of unspecified cerebral artery: Secondary | ICD-10-CM | POA: Diagnosis not present

## 2016-03-01 NOTE — Progress Notes (Signed)
Carelink Summary Report / Loop Recorder 

## 2016-03-07 LAB — CUP PACEART REMOTE DEVICE CHECK: Date Time Interrogation Session: 20170211223538

## 2016-03-26 ENCOUNTER — Ambulatory Visit (INDEPENDENT_AMBULATORY_CARE_PROVIDER_SITE_OTHER): Payer: Medicare Other | Admitting: *Deleted

## 2016-03-26 DIAGNOSIS — I6330 Cerebral infarction due to thrombosis of unspecified cerebral artery: Secondary | ICD-10-CM

## 2016-03-29 NOTE — Progress Notes (Signed)
Carelink Summary Report / Loop Recorder 

## 2016-04-07 LAB — CUP PACEART REMOTE DEVICE CHECK: MDC IDC SESS DTM: 20170313230520

## 2016-04-11 LAB — CUP PACEART REMOTE DEVICE CHECK: Date Time Interrogation Session: 20170412230611

## 2016-04-11 NOTE — Progress Notes (Signed)
Carelink summary report received. Battery status OK. Normal device function. No new symptom episodes, tachy episodes, brady, or pause episodes. No new AF episodes. Monthly summary reports and ROV/PRN 

## 2016-04-26 ENCOUNTER — Ambulatory Visit (INDEPENDENT_AMBULATORY_CARE_PROVIDER_SITE_OTHER): Payer: Medicare Other | Admitting: *Deleted

## 2016-04-26 DIAGNOSIS — I6330 Cerebral infarction due to thrombosis of unspecified cerebral artery: Secondary | ICD-10-CM

## 2016-04-26 NOTE — Progress Notes (Signed)
Carelink Summary Report / Loop Recorder 

## 2016-04-30 LAB — CUP PACEART REMOTE DEVICE CHECK
Date Time Interrogation Session: 20170611233533
MDC IDC SESS DTM: 20170512233535

## 2016-05-25 ENCOUNTER — Ambulatory Visit (INDEPENDENT_AMBULATORY_CARE_PROVIDER_SITE_OTHER): Payer: Medicare Other | Admitting: *Deleted

## 2016-05-25 DIAGNOSIS — I6330 Cerebral infarction due to thrombosis of unspecified cerebral artery: Secondary | ICD-10-CM | POA: Diagnosis not present

## 2016-05-26 NOTE — Progress Notes (Signed)
Carelink Summary Report / Loop Recorder 

## 2016-06-04 ENCOUNTER — Other Ambulatory Visit: Payer: Self-pay

## 2016-06-04 DIAGNOSIS — I77810 Thoracic aortic ectasia: Secondary | ICD-10-CM

## 2016-06-22 LAB — CUP PACEART REMOTE DEVICE CHECK: MDC IDC SESS DTM: 20170712000800

## 2016-06-23 ENCOUNTER — Other Ambulatory Visit (INDEPENDENT_AMBULATORY_CARE_PROVIDER_SITE_OTHER): Payer: Medicare Other

## 2016-06-23 ENCOUNTER — Other Ambulatory Visit: Payer: Self-pay

## 2016-06-23 ENCOUNTER — Ambulatory Visit (HOSPITAL_COMMUNITY): Payer: Medicare Other | Attending: Cardiology

## 2016-06-23 DIAGNOSIS — I77810 Thoracic aortic ectasia: Secondary | ICD-10-CM | POA: Insufficient documentation

## 2016-06-23 DIAGNOSIS — Q2112 Patent foramen ovale: Secondary | ICD-10-CM

## 2016-06-23 DIAGNOSIS — Q211 Atrial septal defect: Secondary | ICD-10-CM | POA: Diagnosis present

## 2016-06-23 DIAGNOSIS — I10 Essential (primary) hypertension: Secondary | ICD-10-CM | POA: Diagnosis not present

## 2016-06-23 LAB — BASIC METABOLIC PANEL
BUN: 21 mg/dL (ref 7–25)
CHLORIDE: 102 mmol/L (ref 98–110)
CO2: 30 mmol/L (ref 20–31)
Calcium: 9.8 mg/dL (ref 8.6–10.3)
Creat: 1.02 mg/dL (ref 0.70–1.25)
GLUCOSE: 93 mg/dL (ref 65–99)
POTASSIUM: 3.9 mmol/L (ref 3.5–5.3)
Sodium: 139 mmol/L (ref 135–146)

## 2016-06-24 ENCOUNTER — Ambulatory Visit (INDEPENDENT_AMBULATORY_CARE_PROVIDER_SITE_OTHER): Payer: Medicare Other | Admitting: *Deleted

## 2016-06-24 DIAGNOSIS — I6330 Cerebral infarction due to thrombosis of unspecified cerebral artery: Secondary | ICD-10-CM

## 2016-06-25 ENCOUNTER — Encounter (HOSPITAL_COMMUNITY): Payer: Self-pay

## 2016-06-25 ENCOUNTER — Ambulatory Visit (HOSPITAL_COMMUNITY)
Admission: RE | Admit: 2016-06-25 | Discharge: 2016-06-25 | Disposition: A | Discharge status: Home / Self Care | Payer: Medicare Other | Source: Ambulatory Visit | Attending: Cardiovascular Disease | Admitting: Cardiovascular Disease

## 2016-06-25 ENCOUNTER — Telehealth: Payer: Self-pay | Admitting: Cardiovascular Disease

## 2016-06-25 ENCOUNTER — Emergency Department (HOSPITAL_COMMUNITY): Payer: Medicare Other

## 2016-06-25 ENCOUNTER — Emergency Department (HOSPITAL_COMMUNITY)
Admission: EM | Admit: 2016-06-25 | Discharge: 2016-06-26 | Disposition: A | Discharge status: Home / Self Care | Payer: Medicare Other | Attending: Emergency Medicine | Admitting: Emergency Medicine

## 2016-06-25 DIAGNOSIS — Z8673 Personal history of transient ischemic attack (TIA), and cerebral infarction without residual deficits: Secondary | ICD-10-CM | POA: Diagnosis not present

## 2016-06-25 DIAGNOSIS — I77810 Thoracic aortic ectasia: Secondary | ICD-10-CM | POA: Insufficient documentation

## 2016-06-25 DIAGNOSIS — Z7982 Long term (current) use of aspirin: Secondary | ICD-10-CM | POA: Insufficient documentation

## 2016-06-25 DIAGNOSIS — Z79899 Other long term (current) drug therapy: Secondary | ICD-10-CM | POA: Diagnosis not present

## 2016-06-25 DIAGNOSIS — I700 Atherosclerosis of aorta: Secondary | ICD-10-CM | POA: Insufficient documentation

## 2016-06-25 DIAGNOSIS — W228XXA Striking against or struck by other objects, initial encounter: Secondary | ICD-10-CM | POA: Diagnosis not present

## 2016-06-25 DIAGNOSIS — Y999 Unspecified external cause status: Secondary | ICD-10-CM | POA: Diagnosis not present

## 2016-06-25 DIAGNOSIS — S0101XA Laceration without foreign body of scalp, initial encounter: Secondary | ICD-10-CM | POA: Diagnosis present

## 2016-06-25 DIAGNOSIS — Z7901 Long term (current) use of anticoagulants: Secondary | ICD-10-CM | POA: Insufficient documentation

## 2016-06-25 DIAGNOSIS — S0990XA Unspecified injury of head, initial encounter: Secondary | ICD-10-CM | POA: Diagnosis not present

## 2016-06-25 DIAGNOSIS — Y92814 Boat as the place of occurrence of the external cause: Secondary | ICD-10-CM | POA: Insufficient documentation

## 2016-06-25 DIAGNOSIS — I2510 Atherosclerotic heart disease of native coronary artery without angina pectoris: Secondary | ICD-10-CM | POA: Insufficient documentation

## 2016-06-25 DIAGNOSIS — Y9389 Activity, other specified: Secondary | ICD-10-CM | POA: Insufficient documentation

## 2016-06-25 DIAGNOSIS — I10 Essential (primary) hypertension: Secondary | ICD-10-CM | POA: Diagnosis not present

## 2016-06-25 DIAGNOSIS — Z23 Encounter for immunization: Secondary | ICD-10-CM | POA: Diagnosis not present

## 2016-06-25 MED ORDER — IOPAMIDOL (ISOVUE-370) INJECTION 76%
INTRAVENOUS | Status: AC
  Administered 2016-06-25: 80 mL
  Filled 2016-06-25: qty 100

## 2016-06-25 MED ORDER — LIDOCAINE-EPINEPHRINE (PF) 2 %-1:200000 IJ SOLN
10.0000 mL | Freq: Once | INTRAMUSCULAR | Status: AC
  Administered 2016-06-26: 10 mL
  Filled 2016-06-25: qty 20

## 2016-06-25 NOTE — Telephone Encounter (Signed)
[facility]  Dr. Llana AlimentEntrikin wanted communicated an incidental finding on left kidney.  Recommends non emergent follow up with MRI, nothing acute.  Will route to Dr. Excell Seltzerooper.

## 2016-06-25 NOTE — Telephone Encounter (Signed)
New message       Calling to give abn CT results
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
## 2016-06-26 DIAGNOSIS — S0101XA Laceration without foreign body of scalp, initial encounter: Secondary | ICD-10-CM | POA: Diagnosis not present

## 2016-06-26 MED ORDER — TETANUS-DIPHTH-ACELL PERTUSSIS 5-2.5-18.5 LF-MCG/0.5 IM SUSP
0.5000 mL | Freq: Once | INTRAMUSCULAR | Status: AC
  Administered 2016-06-26: 0.5 mL via INTRAMUSCULAR
  Filled 2016-06-26: qty 0.5

## 2016-06-26 NOTE — ED Notes (Signed)
Pt verbalized understanding of d/c instructions and has no further questions. Pt to follow up with pcp in 7 days for suture removal. Pt provided with supplies to change dressing over wound for 1 week. Pt alert and oriented x 4, ambulatory, VSS, NAD.

## 2016-06-26 NOTE — ED Provider Notes (Signed)
[facility]EM[facility]Y DEPT Provider Note   CSN: [id number] Arrival date & time: 06/25/16  2026  First Provider Contact:  First MD Initiated Contact with Patient 06/25/16 2359        History   Chief Complaint Chief Complaint  Patient presents with  . Head Laceration    HPI Fred Russell is a 65 y.o. male.  Patient presents to the ED with a chief complaint of scalp laceration and head injury.  He states that he stood up and hit his head on the tower to his boat.  He sustained a scalp laceration.  He did not pass out.  Bleeding controlled with gauze and pressure.  He takes Xarelto for afib.  He denies any numbness, weakness, or tingling.  Last Tdap unknown.   The history is provided by the patient. No language interpreter was used.    Past Medical History:  Diagnosis Date  . Cryptogenic stroke (HCC)    a. 10/2015-->TEE showed PFO w/ R->L Shunt, s/p closure;  b. s/p MDT Linq.  . Essential hypertension   . Left foot drop 08/13/2015  . PFO (patent foramen ovale)    a. 10/2015 s/p closure w/ 25 mm Amplatzer PFO occluder.    Patient Active Problem List   Diagnosis Date Noted  . Paroxysmal atrial fibrillation (HCC) 12/05/2015  . Cryptogenic stroke (HCC)   . PFO (patent foramen ovale)   . Acute CVA (cerebrovascular accident) (HCC) 10/27/2015  . Cerebrovascular accident (CVA) due to thrombosis of cerebral artery (HCC)   . Hyperlipidemia   . Essential hypertension   . Stroke (cerebrum) (HCC) 10/26/2015  . CVA (cerebral infarction) 10/26/2015  . Left foot drop 08/13/2015    Past Surgical History:  Procedure Laterality Date  . CARDIAC CATHETERIZATION N/A 11/14/2015   Procedure: PFO Closure;  Surgeon: Tonny Bollman, MD;  Location: [facility] INVASIVE CV LAB;  Service: Cardiovascular;  Laterality: N/A;  . EP IMPLANTABLE DEVICE N/A 10/28/2015   Procedure: Loop Recorder Insertion;  Surgeon: Hillis Range, MD;  Location: [facility] INVASIVE CV LAB;  Service: Cardiovascular;  Laterality: N/A;  . ROTATOR  CUFF REPAIR     x3  . TEE WITHOUT CARDIOVERSION N/A 10/28/2015   Procedure: TRANSESOPHAGEAL ECHOCARDIOGRAM (TEE);  Surgeon: Laurey Morale, MD;  Location: [facility] ENDOSCOPY;  Service: Cardiovascular;  Laterality: N/A;       Home Medications    Prior to Admission medications   Medication Sig Start Date End Date Taking? Authorizing Provider  aspirin 81 MG tablet Take 81 mg by mouth daily.     Historical Provider, MD  atorvastatin (LIPITOR) 40 MG tablet Take 1 tablet (40 mg total) by mouth daily at 6 PM. 10/29/15   Pincus Large, DO  hydrochlorothiazide (HYDRODIURIL) 25 MG tablet Take 25 mg by mouth daily.    Historical Provider, MD  lisinopril (PRINIVIL,ZESTRIL) 20 MG tablet Take 20 mg by mouth daily. 12/27/15   Historical Provider, MD  rivaroxaban (XARELTO) 20 MG TABS tablet Take 1 tablet (20 mg total) by mouth daily with supper. 12/05/15   Hillis Range, MD    Family History Family History  Problem Relation Age of Onset  . COPD Mother   . Heart failure Mother   . Skin cancer Mother   . ALS Father     Social History Social History  Substance Use Topics  . Smoking status: Never Smoker  . Smokeless tobacco: Never Used  . Alcohol use No     Allergies   Review of patient's allergies indicates no  known allergies.   Review of Systems Review of Systems  All other systems reviewed and are negative.    Physical Exam Updated Vital Signs BP (!) 178/119   Pulse 92   Temp 98.8 F (37.1 C)   Resp 18   Ht 6\' 2"  (1.88 m)   Wt 83.9 kg   SpO2 98%   BMI 23.75 kg/m   Physical Exam  Constitutional: He is oriented to person, place, and time. He appears well-developed and well-nourished.  HENT:  Head: Normocephalic and atraumatic.  12 cm laceration to scalp, bleeding controlled, no FB  Eyes: Conjunctivae and EOM are normal. Pupils are equal, round, and reactive to light. Right eye exhibits no discharge. Left eye exhibits no discharge. No scleral icterus.  Neck: Normal range of  motion. Neck supple. No JVD present.  Cardiovascular: Normal rate, regular rhythm and normal heart sounds.  Exam reveals no gallop and no friction rub.   No murmur heard. Pulmonary/Chest: Effort normal and breath sounds normal. No respiratory distress. He has no wheezes. He has no rales. He exhibits no tenderness.  Abdominal: Soft. He exhibits no distension and no mass. There is no tenderness. There is no rebound and no guarding.  Musculoskeletal: Normal range of motion. He exhibits no edema or tenderness.  Neurological: He is alert and oriented to person, place, and time.  Moves all extremities CN 3-12 intact Speech is clear  Movements are goal oriented  Skin: Skin is warm and dry.  Psychiatric: He has a normal mood and affect. His behavior is normal. Judgment and thought content normal.  Nursing note and vitals reviewed.    ED Treatments / Results  Labs (all labs ordered are listed, but only abnormal results are displayed) Labs Reviewed - No data to display  EKG  EKG Interpretation None       Radiology Ct Head Wo Contrast  Result Date: 06/25/2016 CLINICAL DATA:  Status post head laceration after hitting head on overhead rack. Patient on blood thinners. Initial encounter. EXAM: CT HEAD WITHOUT CONTRAST TECHNIQUE: Contiguous axial images were obtained from the base of the skull through the vertex without intravenous contrast. COMPARISON:  CT of the head performed 10/26/2015, and MRI of the brain performed 11/24/2015 FINDINGS: There is no evidence of acute infarction, mass lesion, or intra- or extra-axial hemorrhage on CT. A chronic infarct is noted at the right frontoparietal region, with associated encephalomalacia. The posterior fossa, including the cerebellum, brainstem and fourth ventricle, is within normal limits. The third and lateral ventricles are unremarkable in appearance. No mass effect or midline shift is seen. There is no evidence of fracture; visualized osseous  structures are unremarkable in appearance. The orbits are within normal limits. The paranasal sinuses and mastoid air cells are well-aerated. The known soft tissue laceration is not well characterized on CT. IMPRESSION: 1. No evidence of traumatic intracranial injury or fracture. 2. Chronic infarct at the right frontoparietal region, with associated encephalomalacia. Electronically Signed   By: Roanna Raider M.D.   On: 06/25/2016 21:24   Ct Angio Chest Aorta W/cm &/or Wo/cm  Result Date: 06/25/2016 CLINICAL DATA:  65 year old male with history of enlarged aortic root. Followup study. Recent placement of Amplatzer PFO closure device on 11/14/2015. Under treatment for atrial fibrillation. EXAM: CT ANGIOGRAPHY CHEST WITH CONTRAST TECHNIQUE: Multidetector CT imaging of the chest was performed using the standard protocol during bolus administration of intravenous contrast. Multiplanar CT image reconstructions and MIPs were obtained to evaluate the vascular anatomy. CONTRAST:  80  mL of Isovue 370. COMPARISON:  None. FINDINGS: Cardiovascular: Aortic root is dilated. When measured in a left ventricular outflow tract projection, the aortic root measures approximately 26 mm, 48 mm and 37 mm at the annulus, sinuses of Valsalva and sino-tubular junction respectively. Ascending aorta, mid arch and descending aorta measure 3.7 cm, 3.6 cm and 2.7 cm in diameter respectively. No thoracic aortic dissection. Minimal atherosclerotic disease noted in the thoracic aorta. Mixed calcified and noncalcified atherosclerotic plaque noted in the proximal left anterior descending coronary artery. Amplatzer PFO closure device noted in the interatrial septum, which appears properly located. Heart size is borderline enlarged. There is no significant pericardial fluid, thickening or pericardial calcification. Mediastinum/Nodes: No pathologically enlarged mediastinal or hilar lymph nodes. Esophagus is unremarkable in appearance. No axillary  lymphadenopathy. Lungs/Pleura: 4 mm right lower lobe pulmonary nodule (image 38 of series 402). No other larger more suspicious appearing pulmonary nodules or masses. No acute consolidative airspace disease. No pleural effusions. Upper Abdomen: In the visualized portions of the left kidney there are 2 lesions of concern, one in the upper pole of the left kidney anteriorly measuring 18 mm measures 63 HU, and the other lesion in the lateral aspect of the interpolar region measures 2.8 cm and 80 HU. These appear slightly heterogeneous, and are concerning for a enhancing lesions rather than hyperdense cysts, but are technically indeterminate on today's examination. Aortic atherosclerosis. Musculoskeletal: Implantable electronic device in the subcutaneous fat of the anterior left chest wall, presumably a loop recorder. There are no aggressive appearing lytic or blastic lesions noted in the visualized portions of the skeleton. Dextroscoliosis of the thoracic spine redemonstrated. Review of the MIP images confirms the above findings. IMPRESSION: 1. Dilated aortic root, measuring 26 mm, 48 mm and 37 mm at the annulus, sinuses of Valsalva and sino-tubular junction respectively. 2. Amplatzer PFO closure device appears properly located in the interatrial septum. 3. No acute abnormality of the thoracic aorta. 4. **An incidental finding of potential clinical significance has been found. 2 potentially enhancing lesions in the left kidney, as discussed above. Further evaluation with nonemergent MRI of the abdomen with and without IV gadolinium or hematuria protocol CT scan is recommended in the near future to better evaluate these findings.** 5. Aortic atherosclerosis, in addition to left anterior descending coronary artery disease. Assessment for potential risk factor modification, dietary therapy or pharmacologic therapy may be warranted, if clinically indicated. 6. Additional incidental findings, as above. These results will be  called to the ordering clinician or representative by the Radiologist Assistant, and communication documented in the PACS or zVision Dashboard. Electronically Signed   By: Trudie Reedaniel  Entrikin M.D.   On: 06/25/2016 14:25    Procedures Procedures (including critical care time)  Medications Ordered in ED Medications  lidocaine-EPINEPHrine (XYLOCAINE W/EPI) 2 %-1:200000 (PF) injection 10 mL (not administered)  Tdap (BOOSTRIX) injection 0.5 mL (not administered)     Initial Impression / Assessment and Plan / ED Course  I have reviewed the triage vital signs and the nursing notes.  Pertinent labs & imaging results that were available during my care of the patient were reviewed by me and considered in my medical decision making (see chart for details).  Clinical Course    LACERATION REPAIR Performed by: Roxy HorsemanBROWNING, Shade Kaley Authorized by: Roxy HorsemanBROWNING, Clevie Prout Consent: Verbal consent obtained. Risks and benefits: risks, benefits and alternatives were discussed Consent given by: patient Patient identity confirmed: provided demographic data Prepped and Draped in normal sterile fashion Wound explored  Laceration Location: scalp  Laceration Length: 12 cm  No Foreign Bodies seen or palpated  Anesthesia: local infiltration  Local anesthetic: lidocaine 2% with epinephrine  Anesthetic total: 5 ml  Irrigation method: syringe Amount of cleaning: standard  Skin closure: 5-0 prolene  Number of sutures: 13  Technique: running  Patient tolerance: Patient tolerated the procedure well with no immediate complications.   Final Clinical Impressions(s) / ED Diagnoses   Final diagnoses:  Scalp laceration, initial encounter  Head injury, initial encounter    New Prescriptions New Prescriptions   No medications on file     Roxy Horseman, PA-C 06/26/16 0054    Zadie Rhine, MD 06/26/16 1901

## 2016-06-28 NOTE — Progress Notes (Signed)
Carelink Summary Report / Loop Recorder 

## 2016-06-28 NOTE — Telephone Encounter (Signed)
Reviewed CTA. Recommend MRI abdomen with and without IV gadolinium contrast as suggested in report. thanks

## 2016-06-29 ENCOUNTER — Other Ambulatory Visit: Payer: Self-pay

## 2016-06-29 DIAGNOSIS — N289 Disorder of kidney and ureter, unspecified: Secondary | ICD-10-CM

## 2016-06-29 NOTE — Telephone Encounter (Signed)
I spoke with the pt and MRI abdomen with and without contrast scheduled on 07/07/16 at 9:00, [facility] 8[facility]. NPO after midnight for test.

## 2016-06-30 ENCOUNTER — Ambulatory Visit (HOSPITAL_COMMUNITY)
Admission: RE | Admit: 2016-06-30 | Discharge: 2016-06-30 | Disposition: A | Discharge status: Home / Self Care | Payer: Medicare Other | Source: Ambulatory Visit | Attending: Cardiovascular Disease | Admitting: Cardiovascular Disease

## 2016-06-30 DIAGNOSIS — N289 Disorder of kidney and ureter, unspecified: Secondary | ICD-10-CM | POA: Insufficient documentation

## 2016-06-30 MED ORDER — GADOBENATE DIMEGLUMINE 529 MG/ML IV SOLN
20.0000 mL | Freq: Once | INTRAVENOUS | Status: AC | PRN
  Administered 2016-06-30: 18 mL via INTRAVENOUS

## 2016-07-01 ENCOUNTER — Telehealth: Payer: Self-pay | Admitting: Cardiovascular Disease

## 2016-07-01 NOTE — Telephone Encounter (Signed)
I spoke with the pt who is very anxious about MRI results. Impression of study read to the pt and results released to My Chart for the pt to view.

## 2016-07-01 NOTE — Telephone Encounter (Signed)
New message ° ° ° ° °Pt returning nurse call. Please call.  °

## 2016-07-07 ENCOUNTER — Ambulatory Visit (HOSPITAL_COMMUNITY)
Admission: RE | Admit: 2016-07-07 | Discharge: 2016-07-07 | Disposition: A | Discharge status: Home / Self Care | Payer: Medicare Other | Source: Ambulatory Visit | Attending: Cardiovascular Disease | Admitting: Cardiovascular Disease

## 2016-07-15 LAB — CUP PACEART REMOTE DEVICE CHECK: Date Time Interrogation Session: 20170811003544

## 2016-07-15 NOTE — Progress Notes (Signed)
Carelink summary report received. Battery status OK. Normal device function. No new symptom episodes, tachy episodes, brady, or pause episodes. No new AF episodes. Monthly summary reports and ROV/PRN 

## 2016-07-23 ENCOUNTER — Ambulatory Visit (INDEPENDENT_AMBULATORY_CARE_PROVIDER_SITE_OTHER): Payer: Medicare Other | Admitting: Cardiovascular Disease

## 2016-07-23 ENCOUNTER — Encounter: Payer: Self-pay | Admitting: Cardiovascular Disease

## 2016-07-23 VITALS — BP 160/90 | HR 58 | Ht 74.0 in | Wt 192.4 lb

## 2016-07-23 DIAGNOSIS — Q211 Atrial septal defect: Secondary | ICD-10-CM

## 2016-07-23 DIAGNOSIS — I480 Paroxysmal atrial fibrillation: Secondary | ICD-10-CM | POA: Diagnosis not present

## 2016-07-23 DIAGNOSIS — I6350 Cerebral infarction due to unspecified occlusion or stenosis of unspecified cerebral artery: Secondary | ICD-10-CM | POA: Diagnosis not present

## 2016-07-23 DIAGNOSIS — Q2112 Patent foramen ovale: Secondary | ICD-10-CM

## 2016-07-23 NOTE — Patient Instructions (Signed)
Medication Instructions:  Your physician has recommended you make the following change in your medication: Stop aspirin    Labwork: none  Testing/Procedures: Your physician has requested that you have an echocardiogram. Echocardiography is a painless test that uses sound waves to create images of your heart. It provides your doctor with information about the size and shape of your heart and how well your heart's chambers and valves are working. This procedure takes approximately one hour. There are no restrictions for this procedure.  To be done in 12 months--week or so prior to appt with Dr. Excell Seltzerooper    Follow-Up: Your physician wants you to follow-up in: 12 months. You will receive a reminder letter in the mail two months in advance. If you don't receive a letter, please call our office to schedule the follow-up appointment.   Any Other Special Instructions Will Be Listed Below (If Applicable).     If you need a refill on your cardiac medications before your next appointment, please call your pharmacy.

## 2016-07-23 NOTE — Progress Notes (Signed)
Cardiology Office Note Date:  07/23/2016   ID:  Craig GuessJeff W Weatherly, DOB 1951-09-22, MRN [id number]  PCP:  Milana ObeyStephen D Knowlton, MD  Cardiologist:  Tonny Bollmanooper, Fianna Snowball, MD    Chief Complaint  Patient presents with  . Atrial Fibrillation    History of Present Illness: Fred Russell is a 65 y.o. male who presents for  follow-up evaluation. The patient underwent transcatheter PFO closure 11/14/2015. He presented in December 2016 with aphasia, confusion, and facial droop. He was diagnosed with a right frontal stroke. TEE demonstrated a PFO with spontaneous right to left shunting. He elected to undergo transcatheter closure which was performed December 30 without complication. He was diagnosed with paroxysmal atrial fibrillation based on loop recorder interrogation and is now anticoagulated with Xarelto. Also has been diagnosed with a dilated aortic root.   BP at home runs 125-130/70's. Exercising daily and no exertional symptoms. He's biking every morning or riding the elliptical. Today, he denies symptoms of palpitations, chest pain, shortness of breath, orthopnea, PND, lower extremity edema, dizziness, or syncope.  Past Medical History:  Diagnosis Date  . Cryptogenic stroke (HCC)    a. 10/2015-->TEE showed PFO w/ R->L Shunt, s/p closure;  b. s/p MDT Linq.  . Essential hypertension   . Left foot drop 08/13/2015  . PFO (patent foramen ovale)    a. 10/2015 s/p closure w/ 25 mm Amplatzer PFO occluder.    Past Surgical History:  Procedure Laterality Date  . CARDIAC CATHETERIZATION N/A 11/14/2015   Procedure: PFO Closure;  Surgeon: Tonny BollmanMichael Breck Hollinger, MD;  [facility];  Service: Cardiovascular;  Laterality: N/A;  . EP IMPLANTABLE DEVICE N/A 10/28/2015   Procedure: Loop Recorder Insertion;  Surgeon: Hillis RangeJames Allred, MD;  Location: [facility] INVASIVE CV LAB;  Service: Cardiovascular;  Laterality: N/A;  . ROTATOR CUFF REPAIR     x3  . TEE WITHOUT CARDIOVERSION N/A 10/28/2015   Procedure:  TRANSESOPHAGEAL ECHOCARDIOGRAM (TEE);  Surgeon: Laurey Moralealton S McLean, MD;  [facility];  Service: Cardiovascular;  Laterality: N/A;    Current Outpatient Prescriptions  Medication Sig Dispense Refill  . atorvastatin (LIPITOR) 40 MG tablet Take 1 tablet (40 mg total) by mouth daily at 6 PM. 30 tablet 0  . hydrochlorothiazide (HYDRODIURIL) 25 MG tablet Take 25 mg by mouth daily.    [name]. lisinopril (PRINIVIL,ZESTRIL) 20 MG tablet Take 20 mg by mouth daily.  2  . rivaroxaban (XARELTO) 20 MG TABS tablet Take 1 tablet (20 mg total) by mouth daily with supper. 30 tablet 11   No current facility-administered medications for this visit.     Allergies:   Review of patient's allergies indicates no known allergies.   Social History:  The patient  reports that he has never smoked. He has never used smokeless tobacco. He reports that he does not drink alcohol or use drugs.   Family History:  The patient's  family history includes ALS in his father; COPD in his mother; Heart failure in his mother; Skin cancer in his mother.    ROS:  Please see the history of present illness.  Otherwise, review of systems is positive for snoring.  All other systems are reviewed and negative.    PHYSICAL EXAM: VS:  BP (!) 160/90 Comment: nervous  Pulse (!) 58   Ht 6\' 2"  (1.88 m)   Wt 87.3 kg (192 lb 6.4 oz)   BMI 24.70 kg/m  , BMI Body mass index is 24.7 kg/m. GEN: Well nourished, well developed, physically fit male  in no acute distress  HEENT: normal  Neck: no JVD, no masses. No carotid bruits Cardiac: RRR without murmur or gallop                Respiratory:  clear to auscultation bilaterally, normal work of breathing GI: soft, nontender, nondistended, + BS MS: no deformity or atrophy  Ext: no pretibial edema, pedal pulses 2+= bilaterally Skin: warm and dry, no rash Neuro:  Strength and sensation are intact Psych: euthymic mood, full affect  EKG:  EKG is ordered today. The ekg ordered today shows Sinus  bradycardia 58 bpm, within normal limits  Recent Labs: 10/26/2015: ALT 18; TSH 2.885 11/14/2015: Hemoglobin 13.2; Platelets 141 06/23/2016: BUN 21; Creat 1.02; Potassium 3.9; Sodium 139   Lipid Panel     Component Value Date/Time   CHOL 206 (H) 10/27/2015 0317   TRIG 119 10/27/2015 0317   HDL 40 (L) 10/27/2015 0317   CHOLHDL 5.2 10/27/2015 0317   VLDL 24 10/27/2015 0317   LDLCALC 142 (H) 10/27/2015 0317      Wt Readings from Last 3 Encounters:  07/23/16 87.3 kg (192 lb 6.4 oz)  06/25/16 83.9 kg (185 lb)  12/29/15 85.6 kg (188 lb 12.8 oz)     Cardiac Studies Reviewed: MRI Abdomen: IMPRESSION: 1. The 2 lesions in question involving the left kidney on recent CT chest both have imaging features consistent with benign Bosniak II cysts. No suspicious or enhancing lesion in the left kidney.  CT angio Chest: IMPRESSION: 1. Dilated aortic root, measuring 26 mm, 48 mm and 37 mm at the annulus, sinuses of Valsalva and sino-tubular junction respectively. 2. Amplatzer PFO closure device appears properly located in the interatrial septum. 3. No acute abnormality of the thoracic aorta. 4. **An incidental finding of potential clinical significance has been found. 2 potentially enhancing lesions in the left kidney, as discussed above. Further evaluation with nonemergent MRI of the abdomen with and without IV gadolinium or hematuria protocol CT scan is recommended in the near future to better evaluate these findings.** 5. Aortic atherosclerosis, in addition to left anterior descending coronary artery disease. Assessment for potential risk factor modification, dietary therapy or pharmacologic therapy may be warranted, if clinically indicated. 6. Additional incidental findings, as above.  Echo: Study Conclusions  - Left ventricle: Systolic function was normal. The estimated   ejection fraction was in the range of 55% to 60%. - Aorta: Aortic root dimension: 44 mm (ED). Ascending  aortic   diameter: 40 mm (S). - Ascending aorta: The ascending aorta was mildly dilated. - Atrial septum: There was increased thickness of the septum,   consistent with lipomatous hypertrophy. No defect or patent   foramen ovale was identified with agitated saline contrast study. - Impressions: No evidence of intracardiac shunt.  Impressions:  - No evidence of intracardiac shunt.  ASSESSMENT AND PLAN: 1.  PAF: 2 episodes of AF over 10 months, approximately 45 min and 2 min in duration). Tolerating Xarelto without bleeding problems. Stop aspirin.  2. PFO s/p transcatheter closure: normal device position with no residual shunt on recent echo. Repeat echo one year.  3. Dilated aortic root: No significant dilatation is at the level of the sinuses. Will reassess with echo next year and consider MRA the following year.  4. Essential hypertension: [place] component. Blood pressure well controlled at home. Reports readings in the 120s over 70s.  5. Hyperlipidemia: Treated with atorvastatin  Current medicines are reviewed with the patient today.  The patient does not  have concerns regarding medicines.  Labs/ tests ordered today include:   Orders Placed This Encounter  Procedures  . EKG 12-Lead  . ECHOCARDIOGRAM COMPLETE    Disposition:   FU one year with an echo prior to the visit  Signed, Tonny Bollman, MD  07/23/2016 10:01 AM    [facility] [address] [place], [place], [place]  [address] Phone: [phone]; Fax: [phone]

## 2016-07-26 ENCOUNTER — Ambulatory Visit (INDEPENDENT_AMBULATORY_CARE_PROVIDER_SITE_OTHER): Payer: Medicare Other | Admitting: *Deleted

## 2016-07-26 DIAGNOSIS — I6330 Cerebral infarction due to thrombosis of unspecified cerebral artery: Secondary | ICD-10-CM

## 2016-07-26 NOTE — Progress Notes (Signed)
Carelink Summary Report / Loop Recorder 

## 2016-08-21 LAB — CUP PACEART REMOTE DEVICE CHECK: Date Time Interrogation Session: 20170910003723

## 2016-08-21 NOTE — Progress Notes (Signed)
Carelink summary report received. Battery status OK. Normal device function. No new symptom episodes, tachy episodes, brady, or pause episodes. No new AF episodes. Monthly summary reports and ROV/PRN 

## 2016-08-23 ENCOUNTER — Ambulatory Visit (INDEPENDENT_AMBULATORY_CARE_PROVIDER_SITE_OTHER): Payer: Medicare Other | Admitting: *Deleted

## 2016-08-23 DIAGNOSIS — I6330 Cerebral infarction due to thrombosis of unspecified cerebral artery: Secondary | ICD-10-CM | POA: Diagnosis not present

## 2016-08-24 NOTE — Progress Notes (Signed)
Carelink Summary Report / Loop Recorder 

## 2016-09-22 ENCOUNTER — Ambulatory Visit (INDEPENDENT_AMBULATORY_CARE_PROVIDER_SITE_OTHER): Payer: Medicare Other | Admitting: *Deleted

## 2016-09-22 DIAGNOSIS — I6330 Cerebral infarction due to thrombosis of unspecified cerebral artery: Secondary | ICD-10-CM | POA: Diagnosis not present

## 2016-09-23 NOTE — Progress Notes (Signed)
Carelink Summary Report / Loop Recorder 

## 2016-09-26 LAB — CUP PACEART REMOTE DEVICE CHECK
Implantable Pulse Generator Implant Date: 20161213
MDC IDC SESS DTM: 20171010023551

## 2016-09-26 NOTE — Progress Notes (Signed)
Carelink summary report received. Battery status OK. Normal device function. No new symptom episodes, tachy episodes, brady, or pause episodes. No new AF episodes. Monthly summary reports and ROV/PRN 

## 2016-10-22 ENCOUNTER — Ambulatory Visit (INDEPENDENT_AMBULATORY_CARE_PROVIDER_SITE_OTHER): Payer: Medicare Other | Admitting: *Deleted

## 2016-10-22 DIAGNOSIS — I6330 Cerebral infarction due to thrombosis of unspecified cerebral artery: Secondary | ICD-10-CM | POA: Diagnosis not present

## 2016-10-25 NOTE — Progress Notes (Signed)
Carelink Summary Report / Loop Recorder 

## 2016-11-06 LAB — CUP PACEART REMOTE DEVICE CHECK
Date Time Interrogation Session: 20171109033729
Implantable Pulse Generator Implant Date: 20161213

## 2016-11-06 NOTE — Progress Notes (Signed)
Carelink summary report received. Battery status OK. Normal device function. No new symptom episodes, tachy episodes, brady, or pause episodes. No new AF episodes. Monthly summary reports and ROV/PRN 

## 2016-11-22 ENCOUNTER — Ambulatory Visit (INDEPENDENT_AMBULATORY_CARE_PROVIDER_SITE_OTHER): Payer: Medicare Other | Admitting: *Deleted

## 2016-11-22 DIAGNOSIS — I6330 Cerebral infarction due to thrombosis of unspecified cerebral artery: Secondary | ICD-10-CM

## 2016-11-22 NOTE — Progress Notes (Signed)
Carelink Summary Report / Loop Recorder 

## 2016-11-29 ENCOUNTER — Other Ambulatory Visit: Payer: Self-pay | Admitting: Cardiovascular Disease

## 2016-12-11 LAB — CUP PACEART REMOTE DEVICE CHECK
Date Time Interrogation Session: 20171209043642
MDC IDC PG IMPLANT DT: 20161213

## 2016-12-11 NOTE — Progress Notes (Signed)
Carelink summary report received. Battery status OK. Normal device function. No new symptom episodes, tachy episodes, brady, or pause episodes. No new AF episodes. Monthly summary reports and ROV/PRN 

## 2016-12-22 ENCOUNTER — Ambulatory Visit (INDEPENDENT_AMBULATORY_CARE_PROVIDER_SITE_OTHER): Payer: Medicare Other | Admitting: *Deleted

## 2016-12-22 DIAGNOSIS — I6330 Cerebral infarction due to thrombosis of unspecified cerebral artery: Secondary | ICD-10-CM | POA: Diagnosis not present

## 2016-12-22 NOTE — Progress Notes (Signed)
Carelink Summary Report / Loop Recorder 

## 2016-12-23 ENCOUNTER — Other Ambulatory Visit: Payer: Self-pay | Admitting: Internal Medicine

## 2016-12-23 DIAGNOSIS — I480 Paroxysmal atrial fibrillation: Secondary | ICD-10-CM

## 2017-01-04 LAB — CUP PACEART REMOTE DEVICE CHECK
Date Time Interrogation Session: 20180108051026
MDC IDC PG IMPLANT DT: 20161213

## 2017-01-16 LAB — CUP PACEART REMOTE DEVICE CHECK
Date Time Interrogation Session: 20180207051319
MDC IDC PG IMPLANT DT: 20161213

## 2017-01-16 NOTE — Progress Notes (Signed)
Carelink summary report received. Battery status OK. Normal device function. No new symptom episodes, tachy episodes, brady, or pause episodes. No new AF episodes. Monthly summary reports and ROV/PRN 

## 2017-01-21 ENCOUNTER — Ambulatory Visit (INDEPENDENT_AMBULATORY_CARE_PROVIDER_SITE_OTHER): Payer: Medicare Other | Admitting: *Deleted

## 2017-01-21 DIAGNOSIS — I6330 Cerebral infarction due to thrombosis of unspecified cerebral artery: Secondary | ICD-10-CM

## 2017-01-21 NOTE — Progress Notes (Signed)
Carelink Summary Report / Loop Recorder 

## 2017-02-02 LAB — CUP PACEART REMOTE DEVICE CHECK
Date Time Interrogation Session: 20180309051345
MDC IDC PG IMPLANT DT: 20161213

## 2017-02-21 ENCOUNTER — Ambulatory Visit (INDEPENDENT_AMBULATORY_CARE_PROVIDER_SITE_OTHER): Payer: Medicare Other | Admitting: *Deleted

## 2017-02-21 DIAGNOSIS — I6330 Cerebral infarction due to thrombosis of unspecified cerebral artery: Secondary | ICD-10-CM

## 2017-02-21 NOTE — Progress Notes (Signed)
Carelink Summary Report / Loop Recorder 

## 2017-03-04 LAB — CUP PACEART REMOTE DEVICE CHECK
Implantable Pulse Generator Implant Date: 20161213
MDC IDC SESS DTM: 20180408053701

## 2017-03-22 ENCOUNTER — Ambulatory Visit (INDEPENDENT_AMBULATORY_CARE_PROVIDER_SITE_OTHER): Payer: Medicare Other | Admitting: *Deleted

## 2017-03-22 DIAGNOSIS — I6330 Cerebral infarction due to thrombosis of unspecified cerebral artery: Secondary | ICD-10-CM | POA: Diagnosis not present

## 2017-03-22 NOTE — Progress Notes (Signed)
Carelink Summary Report / Loop Recorder 

## 2017-04-01 LAB — CUP PACEART REMOTE DEVICE CHECK
Implantable Pulse Generator Implant Date: 20161213
MDC IDC SESS DTM: 20180508061051

## 2017-04-21 ENCOUNTER — Ambulatory Visit (INDEPENDENT_AMBULATORY_CARE_PROVIDER_SITE_OTHER): Payer: Medicare Other | Admitting: *Deleted

## 2017-04-21 DIAGNOSIS — I6330 Cerebral infarction due to thrombosis of unspecified cerebral artery: Secondary | ICD-10-CM

## 2017-04-22 NOTE — Progress Notes (Signed)
Carelink Summary Report / Loop Recorder 

## 2017-05-02 LAB — CUP PACEART REMOTE DEVICE CHECK
MDC IDC PG IMPLANT DT: 20161213
MDC IDC SESS DTM: 20180607061136

## 2017-05-02 NOTE — Progress Notes (Signed)
[facility] summary report received. Battery status OK. Normal device function. No new symptom episodes, brady, or pause episodes. No new AF episodes. 1 tachy- 17 sec's V 200 BPM, EGM appears SVT. No symptoms reported in EPIC. Monthly summary reports and ROV/PRN

## 2017-05-23 ENCOUNTER — Ambulatory Visit (INDEPENDENT_AMBULATORY_CARE_PROVIDER_SITE_OTHER): Payer: Medicare Other | Admitting: *Deleted

## 2017-05-23 DIAGNOSIS — I6330 Cerebral infarction due to thrombosis of unspecified cerebral artery: Secondary | ICD-10-CM | POA: Diagnosis not present

## 2017-05-23 NOTE — Progress Notes (Signed)
Carelink Summary Report / Loop Recorder 

## 2017-06-01 LAB — CUP PACEART REMOTE DEVICE CHECK
Implantable Pulse Generator Implant Date: 20161213
MDC IDC SESS DTM: 20180707064825

## 2017-06-19 IMAGING — CT CT HEAD W/O CM
2 series · 15 of 30 positions shown, 17 images · non-contrast
Comparison: None available

CLINICAL DATA: Acute stroke symptoms, left facial droop, confusion,
ectasia complicated stroke

EXAM:
CT HEAD WITHOUT CONTRAST
TECHNIQUE: Contiguous axial images were obtained from the base of the skull
through the vertex without contrast.

[Series 2: head without · axial · non-contrast · 0.48mm/px · z∈[-46,+69]mm · 7 of 31 slices shown, 9 images]
[im 4/31  brain]
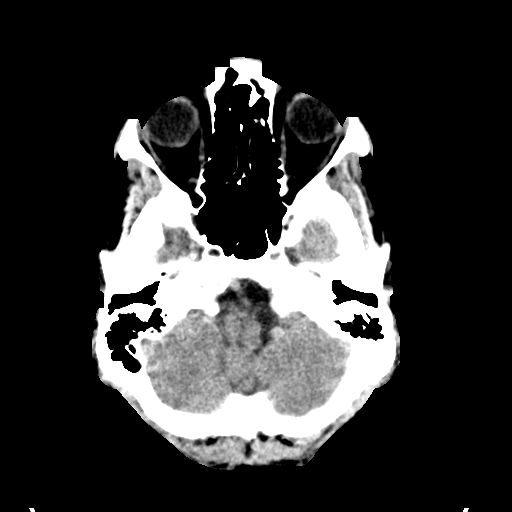
[im 4/31  bone]
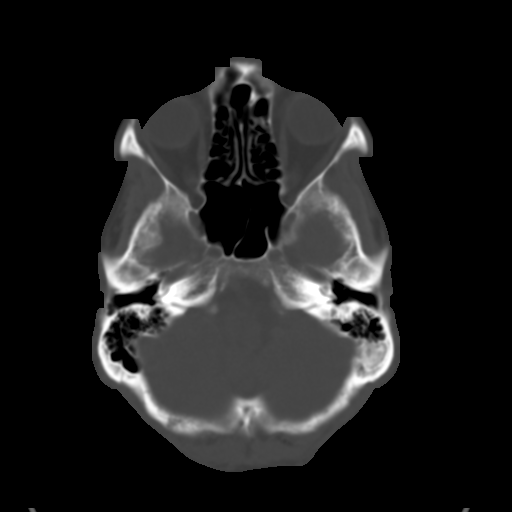
[im 8/31  brain]
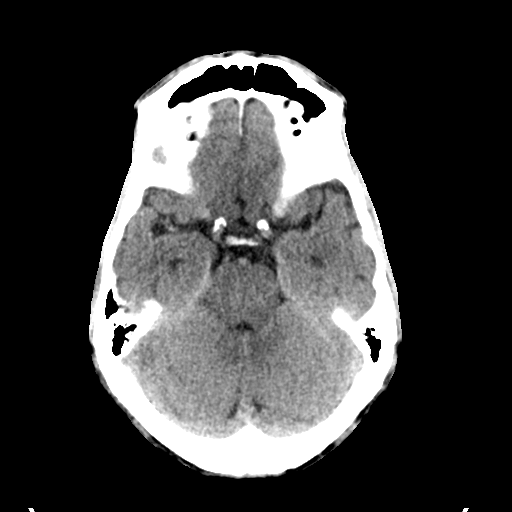
[im 12/31  brain]
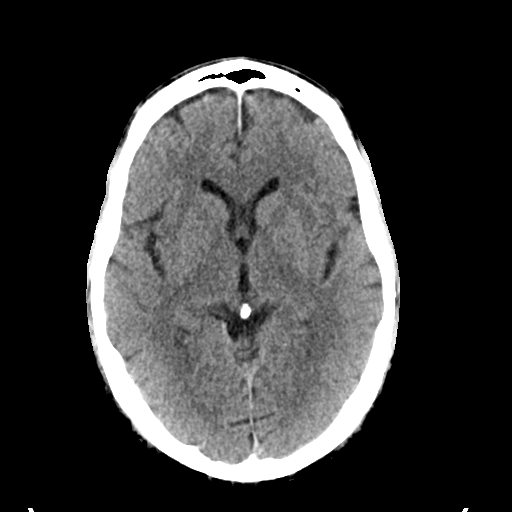
[im 16/31  brain]
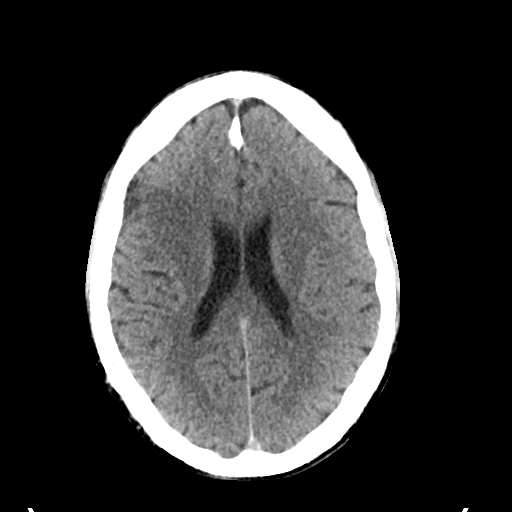
[im 19/31  brain]
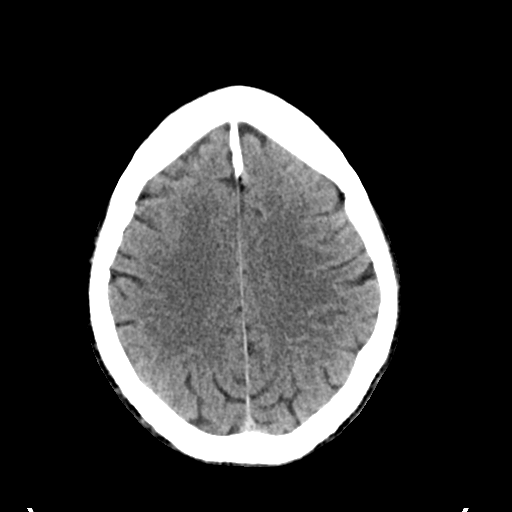
[im 19/31  bone]
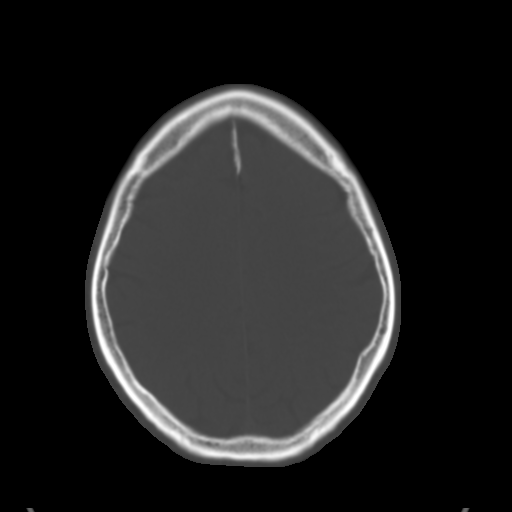
[im 23/31  brain]
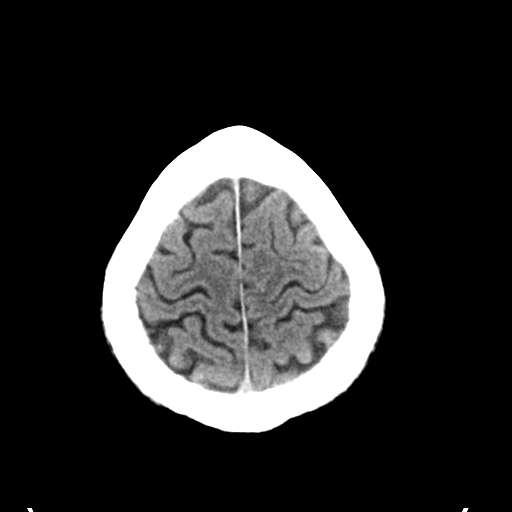
[im 27/31  brain]
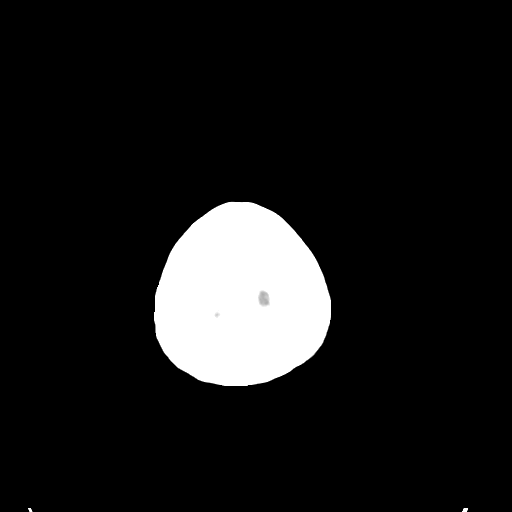

[Series 3: head bone · axial · 0.48mm/px · z∈[-47,+75]mm · 8 of 77 slices shown]
[im 8/77  bone]
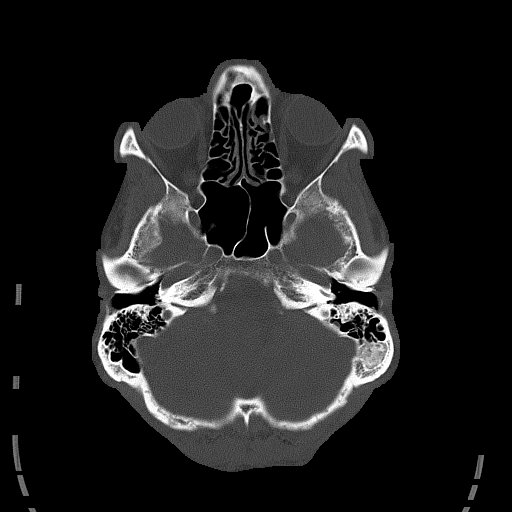
[im 16/77  bone]
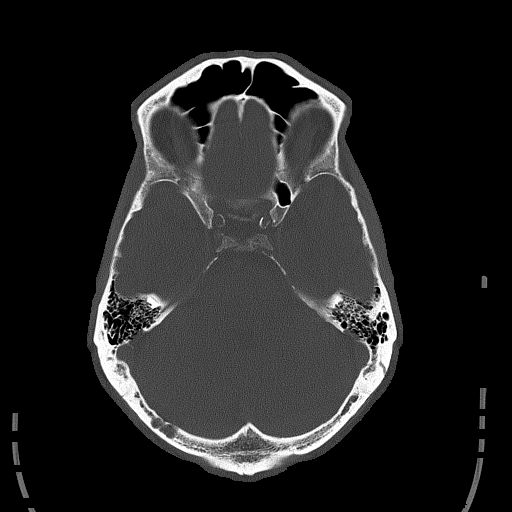
[im 23/77  bone]
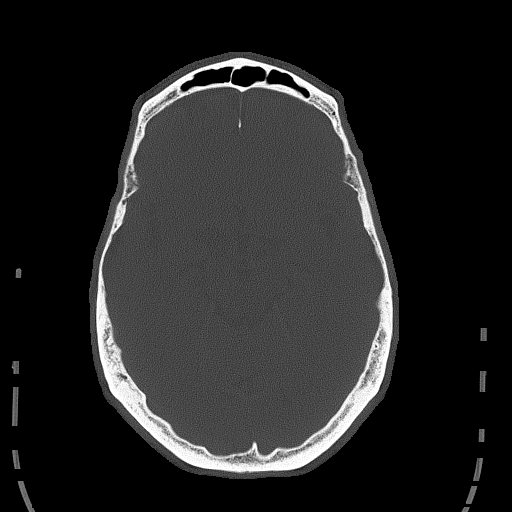
[im 35/77  bone]
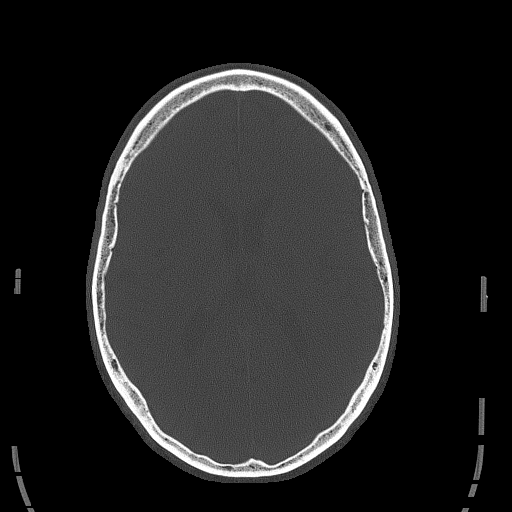
[im 42/77  bone]
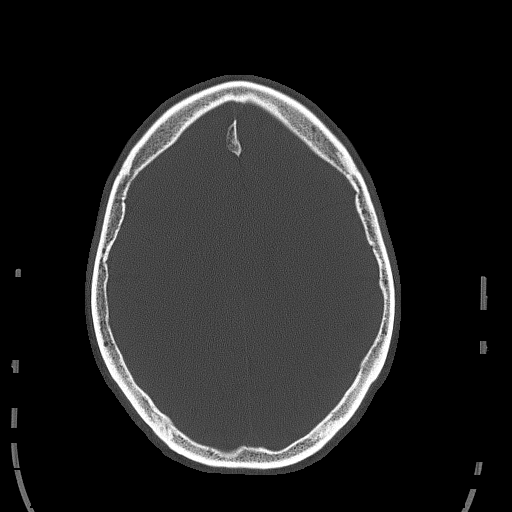
[im 54/77  bone]
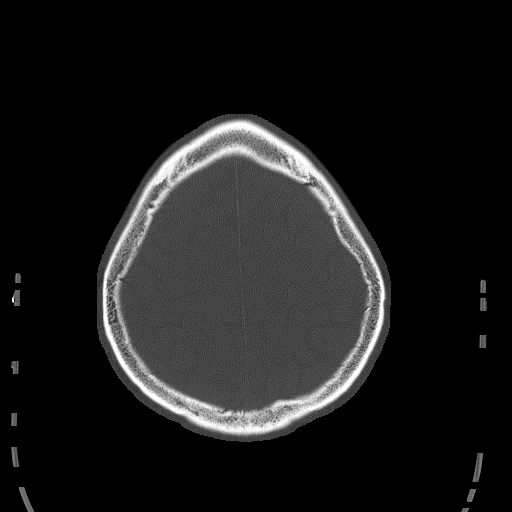
[im 61/77  bone]
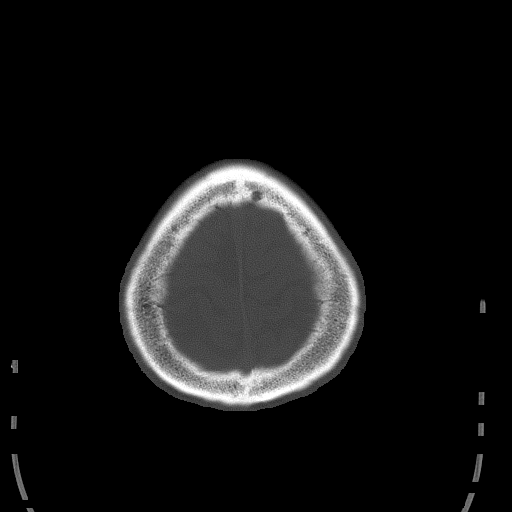
[im 69/77  bone]
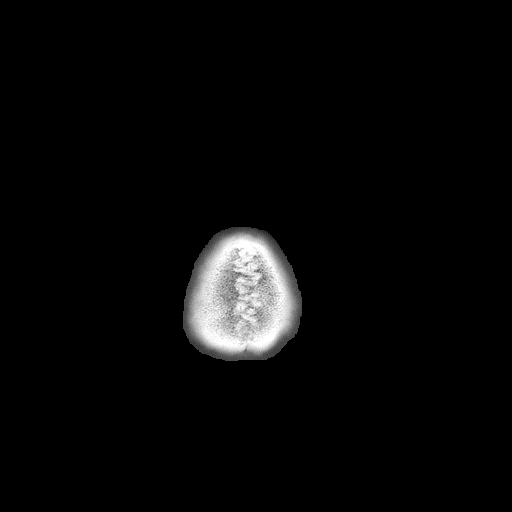

[15 of 30 positions shown; findings below may reference images not displayed]

FINDINGS: Small right frontal ill-defined hypodensity with loss of gray-white
differentiation, images 13-17, compatible with a small acute stroke
involving the anterior MCA territory.

No associated intracranial hemorrhage, significant mass effect,
midline shift, herniation or hydrocephalus. Symmetric midline
ventricles. Cisterns are patent. No cerebellar abnormality.
Atherosclerosis of the intracranial vessels of the skullbase. Orbits
are symmetric. Mastoids and sinuses remain clear. No skull
abnormality.
IMPRESSION: Small right anterior frontal hypodensity compatible with acute
infarction, no associated hemorrhage or significant mass effect.

These results were called by telephone at the time of interpretation
on 10/26/2015 at [DATE] to Dr. Kylie, who verbally
acknowledged these results.

## 2017-06-19 IMAGING — MR MR HEAD WO/W CM
12 of 20 series · 21 of 48 positions shown · IV contrast (multihance)
Comparison: CT head without contrast from the same day.

CLINICAL DATA: Altered mental status. Slurred speech. Abnormal CT
scan.

EXAM:
MRI HEAD WITHOUT AND WITH CONTRAST
MRA HEAD WITHOUT CONTRAST
MRA NECK WITHOUT AND WITH CONTRAST
TECHNIQUE: Multiplanar, multiecho pulse sequences of the brain and surrounding
structures were obtained without and with intravenous contrast.
Angiographic images of the Circle of Willis were obtained using MRA
technique without intravenous contrast. Angiographic images of the
neck were obtained using MRA technique without and with intravenous
contrast. Carotid stenosis measurements (when applicable) are
obtained utilizing NASCET criteria, using the distal internal
carotid diameter as the denominator.
CONTRAST:  17mL MULTIHANCE GADOBENATE DIMEGLUMINE 529 MG/ML IV SOLN

[Series 3: DWI · axial · 3.6mm · 0.94mm/px · z∈[-79,+70]mm · 2 of 86 slices shown (1 of 4)]
[im 1/86]
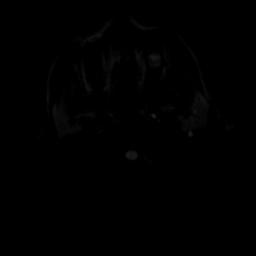
[im 86/86]
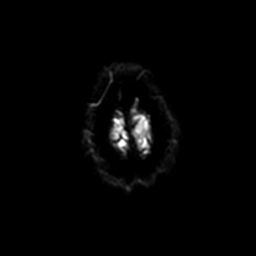

[Series 4: ax (id) 2 · axial · 1.2mm · 0.43mm/px · z∈[-71,+48]mm · 7 of 200 slices shown]
[im 1/200]
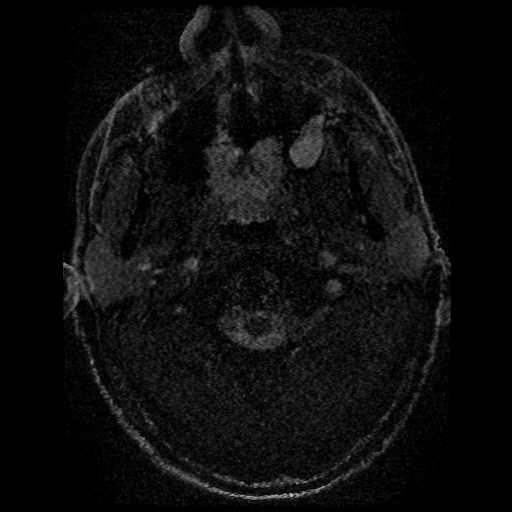
[im 34/200]
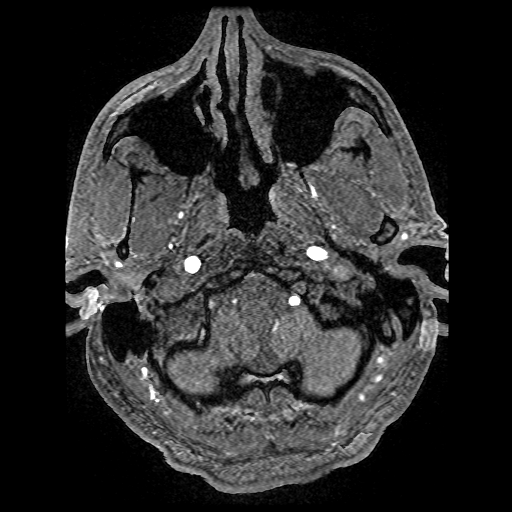
[im 67/200]
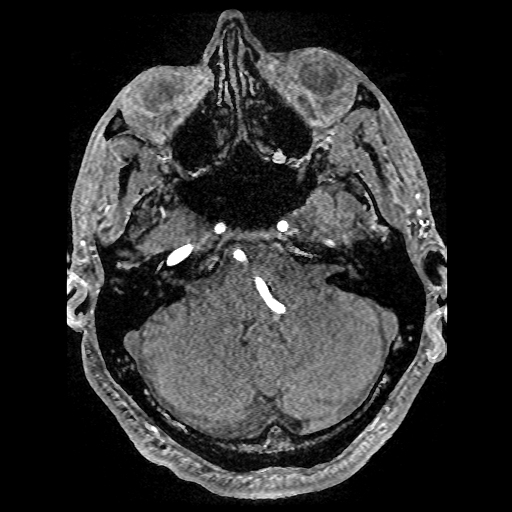
[im 100/200]
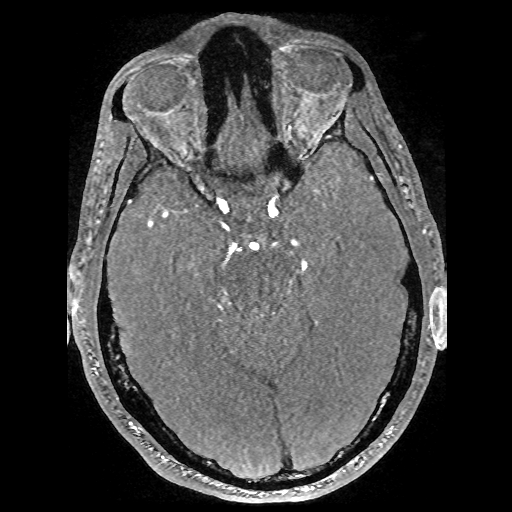
[im 133/200]
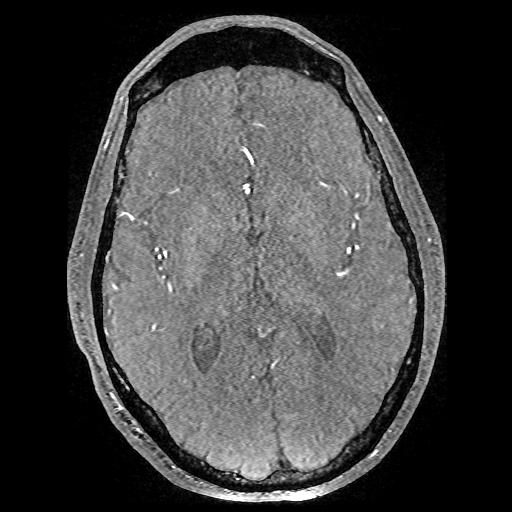
[im 166/200]
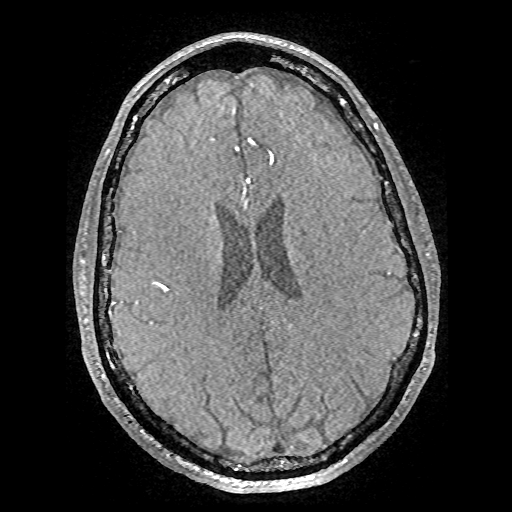
[im 200/200]
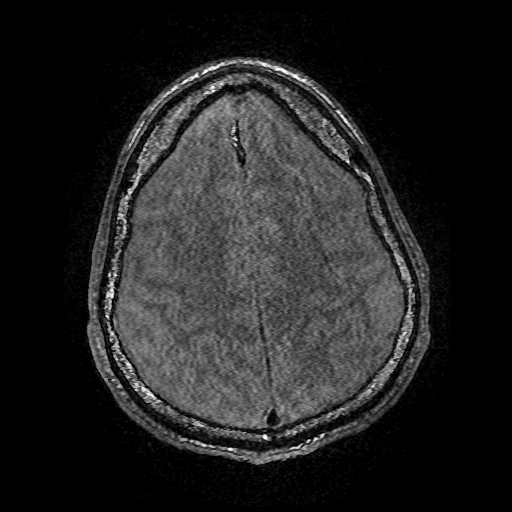

[Series 5: FLAIR · sagittal · 5.0mm · 0.47mm/px · 1 of 23 slices shown (1 of 2)]
[im 1/23]
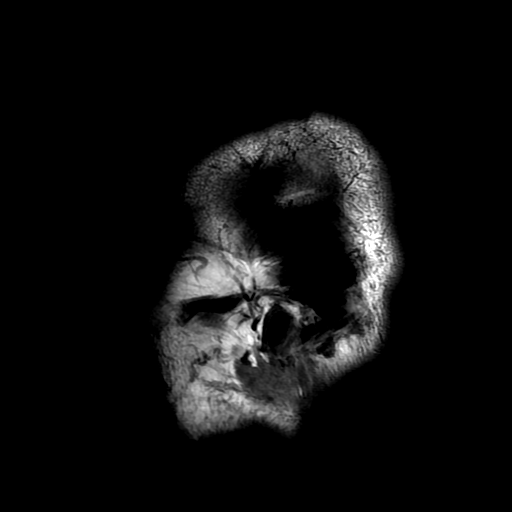

[Series 6: DWI · coronal · 5.0mm · 0.94mm/px · 2 of 72 slices shown (2 of 4)]
[im 1/72]
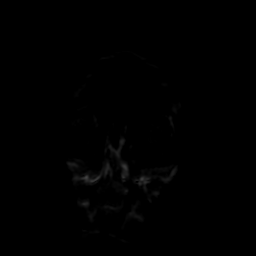
[im 72/72]
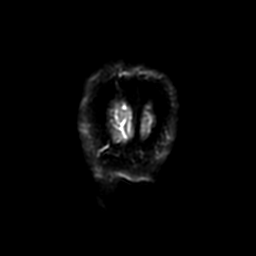

[Series 7: (person_name) · axial · 3.0mm · 0.47mm/px · z∈[-80,-30]mm · 2 of 104 slices shown]
[im 1/104]
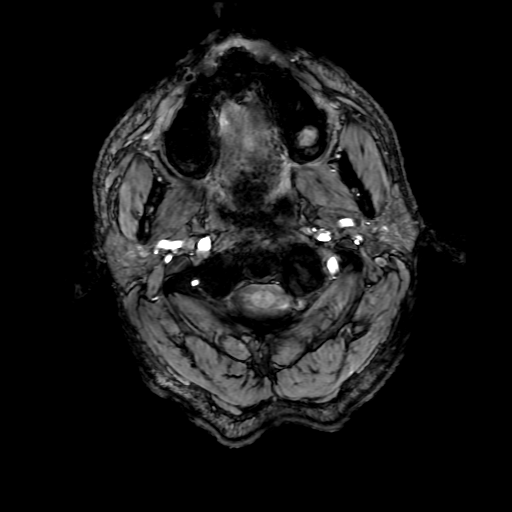
[im 35/104]
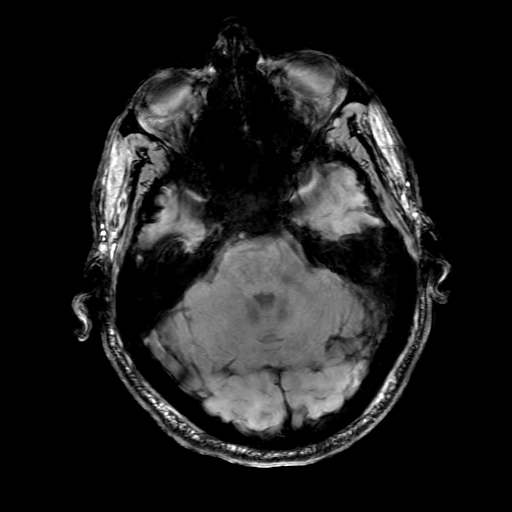

[Series 10: T2 · axial · 5.0mm · 0.47mm/px · 1 of 26 slices shown (1 of 2)]
[im 1/26]
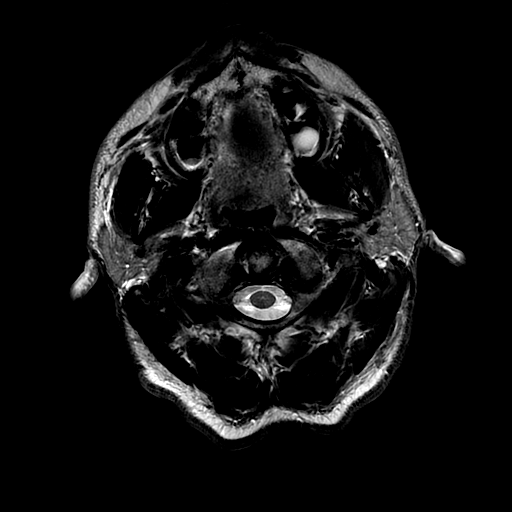

[Series 11: FLAIR · axial · 5.0mm · 0.47mm/px · 1 of 26 slices shown (2 of 2)]
[im 1/26]
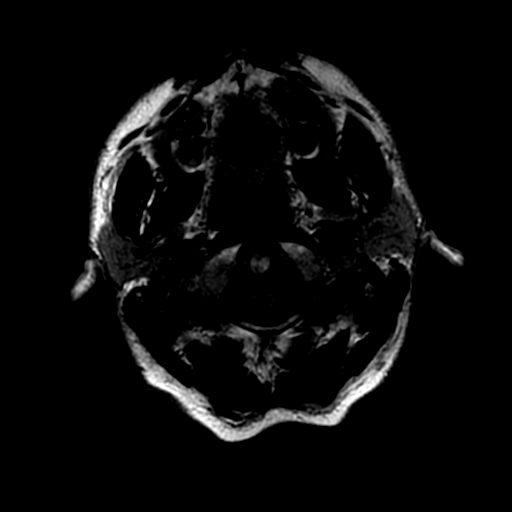

[Series 12: T2 · coronal · 5.0mm · 0.47mm/px · 1 of 30 slices shown (2 of 2)]
[im 1/30]
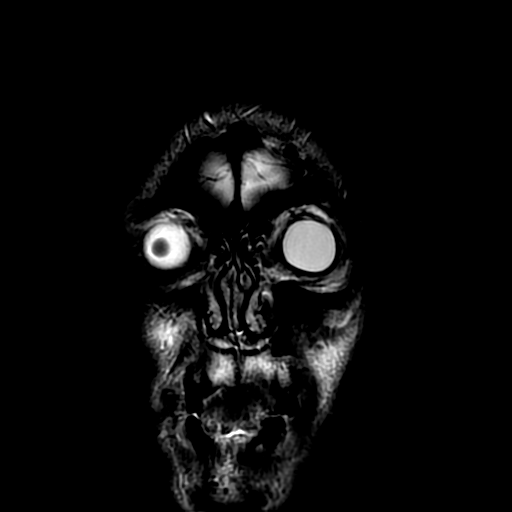

[Series 17: T1 · axial · 5.0mm · 0.47mm/px · 1 of 26 slices shown (1 of 2)]
[im 1/26]
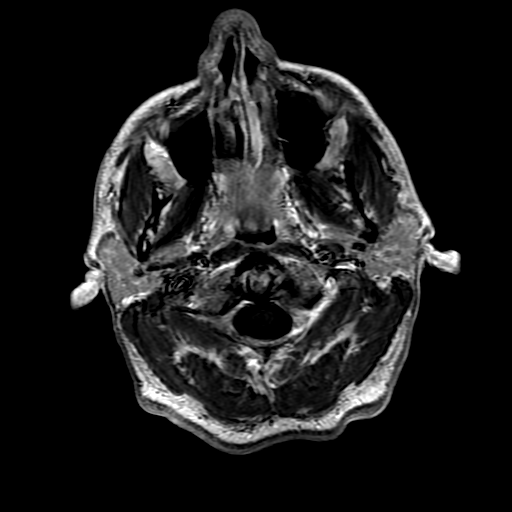

[Series 18: T1 · coronal · 5.0mm · 0.47mm/px · 1 of 30 slices shown (2 of 2)]
[im 1/30]
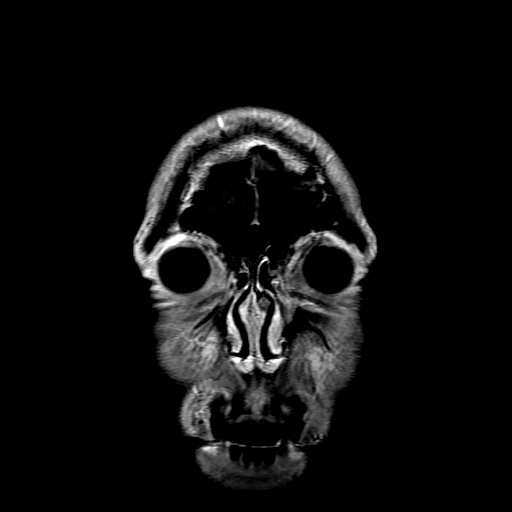

[Series 300: DWI · axial · 3.6mm · 0.94mm/px · 1 of 43 slices shown (3 of 4)]
[im 1/43]
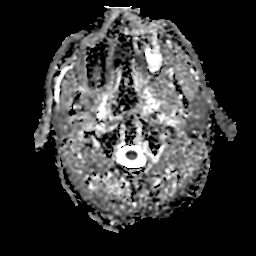

[Series 600: DWI · coronal · 5.0mm · 0.94mm/px · 1 of 35 slices shown (4 of 4)]
[im 1/35]
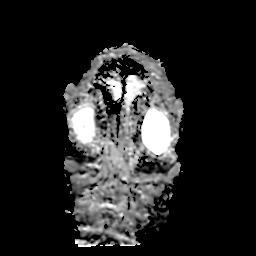

[21 of 48 positions shown; findings below may reference images not displayed]

FINDINGS: MRI HEAD FINDINGS

An acute nonhemorrhagic infarct within the right frontal operculum
is confirmed. Cortical T2 changes are associated with the area of
acute/subacute infarct. Mild subcortical T2 changes on the left are
somewhat advanced for age. Minimal periventricular changes are
present.

There is focal ossification within the anterior falx.

The postcontrast images demonstrate no pathologic enhancement. Flow
is present in the major intracranial arteries. The globes and orbits
are intact. Polyps or mucous retention cysts are noted in the left
maxillary sinus inferiorly. The paranasal sinuses are otherwise
clear.

MRA HEAD FINDINGS

Mild tortuosity in the high cervical internal carotid arteries is
advanced for age. There is no associated stenosis. The internal
carotid arteries are otherwise within normal limits to the ICA
termini bilaterally. The A1 and M1 segments are normal. The anterior
communicating artery is patent. There is focal attenuation in
stenosis within the anterior operculum ir right MCA branch. The ACA
and MCA branch vessels are otherwise intact.

The left vertebral artery is the dominant. A hypoplastic right
vertebral artery bifurcates at the PICA, sending only a very small
branch to the basilar artery. The basilar artery is otherwise within
normal limits. Both posterior cerebral arteries originate from the
basilar tip. The PCA branch vessels are intact.

MRA NECK FINDINGS

The time-of-flight images demonstrate no significant flow
disturbance at either carotid bifurcation. Flow is antegrade in the
vertebral arteries bilaterally.

A 3 vessel arch configuration is present. The right common carotid
artery is within normal limits. The bifurcation is unremarkable. The
cervical right ICA is tortuous just below the skullbase. There is no
associated stenosis.

The left common carotid artery is within normal limits. The carotid
bifurcation is unremarkable. There is some tortuosity of the distal
cervical ICA just below the skullbase. There is no associated
stenosis.

Both vertebral arteries originate from the subclavian arteries. The
left vertebral artery is dominant. The right vertebral artery is
hypoplastic, bifurcating at the PICA.
IMPRESSION: 1. Acute nonhemorrhagic infarct involving the right frontal
operculum.
2. Focal high-grade stenosis in the anterior right frontal operculum
branch vessel of the right MCA.
3. No other significant proximal stenosis, aneurysm, or branch
vessel occlusion within the circle of Willis.
4. No significant stenosis in the neck.
5. The right vertebral artery is hypoplastic but without focal
stenosis.

## 2017-06-20 ENCOUNTER — Ambulatory Visit (INDEPENDENT_AMBULATORY_CARE_PROVIDER_SITE_OTHER): Payer: Medicare Other | Admitting: *Deleted

## 2017-06-20 DIAGNOSIS — I6330 Cerebral infarction due to thrombosis of unspecified cerebral artery: Secondary | ICD-10-CM

## 2017-06-21 NOTE — Progress Notes (Signed)
Carelink Summary Report / Loop Recorder 

## 2017-06-22 ENCOUNTER — Other Ambulatory Visit: Payer: Self-pay | Admitting: Cardiovascular Disease

## 2017-06-22 DIAGNOSIS — I480 Paroxysmal atrial fibrillation: Secondary | ICD-10-CM

## 2017-06-29 LAB — CUP PACEART REMOTE DEVICE CHECK
Date Time Interrogation Session: 20180806104046
MDC IDC PG IMPLANT DT: 20161213

## 2017-07-04 ENCOUNTER — Ambulatory Visit (HOSPITAL_COMMUNITY): Payer: Medicare Other | Attending: Cardiovascular Disease

## 2017-07-04 ENCOUNTER — Other Ambulatory Visit: Payer: Self-pay

## 2017-07-04 DIAGNOSIS — I080 Rheumatic disorders of both mitral and aortic valves: Secondary | ICD-10-CM | POA: Diagnosis not present

## 2017-07-04 DIAGNOSIS — Q2112 Patent foramen ovale: Secondary | ICD-10-CM

## 2017-07-04 DIAGNOSIS — Q211 Atrial septal defect: Secondary | ICD-10-CM | POA: Insufficient documentation

## 2017-07-20 ENCOUNTER — Ambulatory Visit (INDEPENDENT_AMBULATORY_CARE_PROVIDER_SITE_OTHER): Payer: Medicare Other | Admitting: *Deleted

## 2017-07-20 DIAGNOSIS — I6330 Cerebral infarction due to thrombosis of unspecified cerebral artery: Secondary | ICD-10-CM | POA: Diagnosis not present

## 2017-07-21 NOTE — Progress Notes (Signed)
Carelink Summary Report / Loop Recorder 

## 2017-07-25 LAB — CUP PACEART REMOTE DEVICE CHECK
MDC IDC PG IMPLANT DT: 20161213
MDC IDC SESS DTM: 20180905104107

## 2017-08-19 ENCOUNTER — Ambulatory Visit (INDEPENDENT_AMBULATORY_CARE_PROVIDER_SITE_OTHER): Payer: Medicare Other | Admitting: *Deleted

## 2017-08-19 DIAGNOSIS — I480 Paroxysmal atrial fibrillation: Secondary | ICD-10-CM

## 2017-08-19 DIAGNOSIS — I6330 Cerebral infarction due to thrombosis of unspecified cerebral artery: Secondary | ICD-10-CM

## 2017-08-19 NOTE — Progress Notes (Signed)
Loop recorder summary report 

## 2017-08-22 LAB — CUP PACEART REMOTE DEVICE CHECK
MDC IDC PG IMPLANT DT: 20161213
MDC IDC SESS DTM: 20181005104126

## 2017-09-19 ENCOUNTER — Ambulatory Visit (INDEPENDENT_AMBULATORY_CARE_PROVIDER_SITE_OTHER): Payer: Medicare Other | Admitting: *Deleted

## 2017-09-19 DIAGNOSIS — I639 Cerebral infarction, unspecified: Secondary | ICD-10-CM | POA: Diagnosis not present

## 2017-09-19 NOTE — Progress Notes (Signed)
Carelink Summary Report / Loop Recorder 

## 2017-09-20 ENCOUNTER — Other Ambulatory Visit: Payer: Self-pay | Admitting: Cardiovascular Disease

## 2017-09-20 DIAGNOSIS — I480 Paroxysmal atrial fibrillation: Secondary | ICD-10-CM

## 2017-09-22 LAB — CUP PACEART REMOTE DEVICE CHECK
MDC IDC PG IMPLANT DT: 20161213
MDC IDC SESS DTM: 20181104163947

## 2017-10-03 ENCOUNTER — Ambulatory Visit (INDEPENDENT_AMBULATORY_CARE_PROVIDER_SITE_OTHER): Payer: Medicare Other | Admitting: Cardiovascular Disease

## 2017-10-03 ENCOUNTER — Encounter: Payer: Self-pay | Admitting: Cardiovascular Disease

## 2017-10-03 VITALS — BP 142/90 | HR 70 | Ht 74.0 in | Wt 194.4 lb

## 2017-10-03 DIAGNOSIS — I639 Cerebral infarction, unspecified: Secondary | ICD-10-CM | POA: Diagnosis not present

## 2017-10-03 DIAGNOSIS — Q211 Atrial septal defect: Secondary | ICD-10-CM | POA: Diagnosis not present

## 2017-10-03 DIAGNOSIS — Q2112 Patent foramen ovale: Secondary | ICD-10-CM

## 2017-10-03 NOTE — Patient Instructions (Signed)
Medication Instructions:  Your provider recommends that you continue on your current medications as directed. Please refer to the Current Medication list given to you today.     Labwork: None  Testing/Procedures: Dr. Excell Seltzerooper recommends you have an MRA prior to your visit in 1 year.  Follow-Up: Your provider wants you to follow-up in: 1 year with Dr. Excell Seltzerooper. You will receive a reminder letter in the mail two months in advance. If you don't receive a letter, please call our office to schedule the follow-up appointment.    Any Other Special Instructions Will Be Listed Below (If Applicable).     If you need a refill on your cardiac medications before your next appointment, please call your pharmacy.

## 2017-10-03 NOTE — Progress Notes (Signed)
Cardiology Office Note Date:  10/03/2017   ID:  Fred Russell, Fred Russell 1950-12-24, MRN [id number]  PCP:  Gareth Morgan, MD  Cardiologist:  Audelia Hives, MD    Chief Complaint  Patient presents with  . PFO   History of Present Illness: Fred Russell is a 66 y.o. male who presents for follow-up evaluation. The patient underwent transcatheter PFO closure 11/14/2015. He presented in December 2016 with aphasia, confusion, and facial droop and was diagnosed with a right frontal stroke. TEE demonstrated a PFO with spontaneous right to left shunting. Transcatheter PFO closure which was performed without complication. He later was diagnosed with paroxysmal atrial fibrillation based on loop recorder interrogation and is now anticoagulated with Xarelto. Also has been diagnosed with a dilated aortic root.   He is here alone today. He feels well. Today, he denies symptoms of palpitations, chest pain, shortness of breath, orthopnea, PND, lower extremity edema, dizziness, or syncope. He's exercising regularly without exertional symptoms. He's on lisinopril 20 mg BID and monitors BP regularly with home readings in 120's/70's.   Past Medical History:  Diagnosis Date  . Cryptogenic stroke (HCC)    a. 10/2015-->TEE showed PFO w/ R->L Shunt, s/p closure;  b. s/p MDT Linq.  . Essential hypertension   . Left foot drop 08/13/2015  . PFO (patent foramen ovale)    a. 10/2015 s/p closure w/ 25 mm Amplatzer PFO occluder.    Past Surgical History:  Procedure Laterality Date  . Loop Recorder Insertion N/A 10/28/2015   Performed by Hillis Range, MD at [facility] INVASIVE CV LAB  . PFO Closure N/A 11/14/2015   Performed by Tonny Bollman, MD at [facility] INVASIVE CV LAB  . ROTATOR CUFF REPAIR     x3  . TRANSESOPHAGEAL ECHOCARDIOGRAM (TEE) N/A 10/28/2015   Performed by Laurey Morale, MD at [facility] ENDOSCOPY    Current Outpatient Medications  Medication Sig Dispense Refill  . atorvastatin (LIPITOR) 40 MG tablet TAKE 1  TABLET BY MOUTH EACH DAY FOR HIGH CHOLESTEROL. 90 tablet 2  . hydrochlorothiazide (HYDRODIURIL) 25 MG tablet Take 25 mg by mouth daily.    [name] lisinopril (PRINIVIL,ZESTRIL) 20 MG tablet Take 20 mg 2 (two) times daily by mouth.    Carlena Hurl 20 MG TABS tablet TAKE 1 TABLET DAILY WITH SUPPER. 30 tablet 0   No current facility-administered medications for this visit.     Allergies:   Patient has no known allergies.   Social History:  The patient  reports that  has never smoked. he has never used smokeless tobacco. He reports that he does not drink alcohol or use drugs.   Family History:  The patient's family history includes ALS in his father; COPD in his mother; Heart failure in his mother; Skin cancer in his mother.    ROS:  Please see the history of present illness.  All other systems are reviewed and negative.    PHYSICAL EXAM: VS:  BP (!) 142/90   Pulse 70   Ht 6\' 2"  (1.88 m)   Wt 194 lb 6.4 oz (88.2 kg)   BMI 24.96 kg/m  , BMI Body mass index is 24.96 kg/m. GEN: Well nourished, well developed, in no acute distress  HEENT: normal  Neck: no JVD, no masses. No carotid bruits Cardiac: RRR without murmur or gallop     Respiratory:  clear to auscultation bilaterally, normal work of breathing GI: soft, nontender, nondistended, + BS MS: no deformity or atrophy  Ext: no pretibial edema,  pedal pulses 2+= bilaterally Skin: warm and dry, no rash Neuro:  Strength and sensation are intact Psych: euthymic mood, full affect  EKG:  EKG is ordered today. The ekg ordered today shows NSR 70 bpm, RAE, otherwise normal  Recent Labs: No results found for requested labs within last 8760 hours.   Lipid Panel     Component Value Date/Time   CHOL 206 (H) 10/27/2015 0317   TRIG 119 10/27/2015 0317   HDL 40 (L) 10/27/2015 0317   CHOLHDL 5.2 10/27/2015 0317   VLDL 24 10/27/2015 0317   LDLCALC 142 (H) 10/27/2015 0317      Wt Readings from Last 3 Encounters:  10/03/17 194 lb 6.4 oz (88.2 kg)    07/23/16 192 lb 6.4 oz (87.3 kg)  06/25/16 185 lb (83.9 kg)     Cardiac Studies Reviewed: Echo 07-04-2017: Left ventricle:  The cavity size was normal. Systolic function was normal. The estimated ejection fraction was in the range of 55% to 60%. Wall motion was normal; there were no regional wall motion abnormalities. The transmitral flow pattern was normal. The deceleration time of the early transmitral flow velocity was normal. Early diastolic septal annular tissue Doppler velocities Ea were abnormal. There was no evidence of elevated ventricular filling pressure by Doppler parameters.  ------------------------------------------------------------------- Aortic valve:   Trileaflet; normal thickness leaflets.  Doppler: There was mild regurgitation.  ------------------------------------------------------------------- Aorta:  Ascending aorta: The ascending aorta was mildly dilated.  ------------------------------------------------------------------- Mitral valve:   Mild diffuse thickening, consistent with myxomatous proliferation.  Moderate, late systolicprolapse.  Doppler:  There was mild regurgitation.  ------------------------------------------------------------------- Left atrium:  The atrium was normal in size.  ------------------------------------------------------------------- Atrial septum:  No defect or patent foramen ovale was identified.   ------------------------------------------------------------------- Right ventricle:  The cavity size was normal. Systolic function was normal.  ------------------------------------------------------------------- Pulmonic valve:    Structurally normal valve.   Cusp separation was normal.  Doppler:  Transvalvular velocity was within the normal range. There was trivial regurgitation.  ------------------------------------------------------------------- Tricuspid valve:   Structurally normal valve.   Leaflet separation was  normal.  Doppler:  Transvalvular velocity was within the normal range. There was trivial regurgitation.  ------------------------------------------------------------------- Pulmonary artery:   Systolic pressure was within the normal range.   ------------------------------------------------------------------- Right atrium:  The atrium was normal in size.  ------------------------------------------------------------------- Pericardium:  There was no pericardial effusion.  ------------------------------------------------------------------- Systemic veins: Inferior vena cava: The vessel was dilated. The respirophasic diameter changes were blunted (< 50%), consistent with elevated central venous pressure.  CTA Chest 06-25-2016: IMPRESSION: 1. Dilated aortic root, measuring 26 mm, 48 mm and 37 mm at the annulus, sinuses of Valsalva and sino-tubular junction respectively. 2. Amplatzer PFO closure device appears properly located in the interatrial septum. 3. No acute abnormality of the thoracic aorta. 4. **An incidental finding of potential clinical significance has been found. 2 potentially enhancing lesions in the left kidney, as discussed above. Further evaluation with nonemergent MRI of the abdomen with and without IV gadolinium or hematuria protocol CT scan is recommended in the near future to better evaluate these findings.** 5. Aortic atherosclerosis, in addition to left anterior descending coronary artery disease. Assessment for potential risk factor modification, dietary therapy or pharmacologic therapy may be warranted, if clinically indicated. 6. Additional incidental findings, as above.  ASSESSMENT AND PLAN: 1.  PFO s/p transcatheter closure: The patient's most recent echo is reviewed and demonstrates normal device position.  He no longer needs to follow SBE prophylaxis.  He is maintained on  oral anticoagulation in the setting of paroxysmal atrial fibrillation.  2.  Paroxysmal atrial fibrillation: CHADS-Vasc = 4, tolerating oral anticoagulation with rivaroxaban.  The patient has an implantable loop recorder.  He has had no atrial fibrillation in the past year.  I think at the time of his follow-up next year if there is no atrial fibrillation we should consider transitioning him to antiplatelet therapy.  3. Dilated aortic root: CT scan from last year is reviewed.  Aortic root dilatation is stable on his most recent echo.  The aortic root is most dilated at the level of the sinuses.  Recommend an MRA of the chest next year for follow-up evaluation.  4.  Hypertension: Blood pressure is controlled on a combination of lisinopril and hydrochlorothiazide.  5.  Hyperlipidemia: Lipids are followed by his primary care physician.  He is treated with atorvastatin.  6. Aortic atherosclerosis: treated with a statin drug and oral anticoagulation for AF.   Current medicines are reviewed with the patient today.  The patient does not have concerns regarding medicines.  Labs/ tests ordered today include:   Orders Placed This Encounter  Procedures  . EKG 12-Lead    Disposition:   FU one year with an MRA of the chest prior to that visit  Signed, Audelia HivesMichale Aline Wesche, MD  10/03/2017 1:24 PM    [facility] [address], [place], [place]  [address] Phone: [phone]; Fax: [phone]

## 2017-10-18 ENCOUNTER — Ambulatory Visit (INDEPENDENT_AMBULATORY_CARE_PROVIDER_SITE_OTHER): Payer: Medicare Other | Admitting: *Deleted

## 2017-10-18 DIAGNOSIS — I639 Cerebral infarction, unspecified: Secondary | ICD-10-CM

## 2017-10-19 NOTE — Progress Notes (Signed)
Carelink Summary Report / Loop Recorder 

## 2017-10-28 LAB — CUP PACEART REMOTE DEVICE CHECK
MDC IDC PG IMPLANT DT: 20161213
MDC IDC SESS DTM: 20181204170932

## 2017-11-17 ENCOUNTER — Ambulatory Visit (INDEPENDENT_AMBULATORY_CARE_PROVIDER_SITE_OTHER): Payer: Medicare Other | Admitting: *Deleted

## 2017-11-17 DIAGNOSIS — I639 Cerebral infarction, unspecified: Secondary | ICD-10-CM | POA: Diagnosis not present

## 2017-11-18 NOTE — Progress Notes (Signed)
Carelink Summary Report / Loop Recorder 

## 2017-11-21 ENCOUNTER — Other Ambulatory Visit: Payer: Self-pay | Admitting: Cardiovascular Disease

## 2017-11-21 DIAGNOSIS — I480 Paroxysmal atrial fibrillation: Secondary | ICD-10-CM

## 2017-11-23 ENCOUNTER — Other Ambulatory Visit: Payer: Self-pay | Admitting: *Deleted

## 2017-11-23 DIAGNOSIS — I480 Paroxysmal atrial fibrillation: Secondary | ICD-10-CM

## 2017-11-25 NOTE — Telephone Encounter (Signed)
Follow up      *STAT* If patient is at the pharmacy, call can be transferred to refill team.   1. Which medications need to be refilled? (please list name of each medication and dose if known) XARELTO 20 MG TABS tablet  2. Which pharmacy/location (including street and city if local pharmacy) is medication to be sent to? [place]  3. Do they need a 30 day or 90 day supply? 30

## 2017-11-25 NOTE — Telephone Encounter (Signed)
F/U call: Patient upset about Xarelto refill, states that he has made several attempts to get refilled and has 1 pill left.

## 2017-11-25 NOTE — Telephone Encounter (Signed)
Patient is upset he cannot get his Xarelto filled.  Informed patient it appears it was not refilled because he has not had lab work drawn within a specific time. Patient will come in Monday for BMET. He was grateful for call and agrees with treatment plan. Rx sent.

## 2017-11-28 ENCOUNTER — Other Ambulatory Visit: Payer: Medicare Other

## 2017-11-28 DIAGNOSIS — I480 Paroxysmal atrial fibrillation: Secondary | ICD-10-CM

## 2017-11-28 LAB — BASIC METABOLIC PANEL
BUN/Creatinine Ratio: 18 (ref 10–24)
BUN: 21 mg/dL (ref 8–27)
CALCIUM: 9.7 mg/dL (ref 8.6–10.2)
CO2: 29 mmol/L (ref 20–29)
Chloride: 100 mmol/L (ref 96–106)
Creatinine, Ser: 1.19 mg/dL (ref 0.76–1.27)
GFR calc Af Amer: 73 mL/min/{1.73_m2} (ref >59)
GFR, EST NON AFRICAN AMERICAN: 63 mL/min/{1.73_m2} (ref >59)
GLUCOSE: 89 mg/dL (ref 65–99)
Potassium: 3.9 mmol/L (ref 3.5–5.2)
Sodium: 141 mmol/L (ref 134–144)

## 2017-11-30 LAB — CUP PACEART REMOTE DEVICE CHECK
Implantable Pulse Generator Implant Date: 20161213
MDC IDC SESS DTM: 20190103171021

## 2017-12-01 NOTE — Telephone Encounter (Signed)
Ok to fill rx. thx

## 2017-12-19 ENCOUNTER — Ambulatory Visit (INDEPENDENT_AMBULATORY_CARE_PROVIDER_SITE_OTHER): Payer: Medicare Other | Admitting: *Deleted

## 2017-12-19 DIAGNOSIS — I639 Cerebral infarction, unspecified: Secondary | ICD-10-CM | POA: Diagnosis not present

## 2017-12-19 NOTE — Progress Notes (Signed)
Carelink Summary Reprot / Loop Recorder 

## 2017-12-27 LAB — CUP PACEART REMOTE DEVICE CHECK
Date Time Interrogation Session: 20190202174035
MDC IDC PG IMPLANT DT: 20161213

## 2018-01-19 ENCOUNTER — Ambulatory Visit (INDEPENDENT_AMBULATORY_CARE_PROVIDER_SITE_OTHER): Payer: Medicare Other | Admitting: *Deleted

## 2018-01-19 DIAGNOSIS — I639 Cerebral infarction, unspecified: Secondary | ICD-10-CM | POA: Diagnosis not present

## 2018-01-20 NOTE — Progress Notes (Signed)
Carelink Summary Report / Loop Recorder 

## 2018-02-21 ENCOUNTER — Ambulatory Visit (INDEPENDENT_AMBULATORY_CARE_PROVIDER_SITE_OTHER): Payer: Medicare Other | Admitting: *Deleted

## 2018-02-21 DIAGNOSIS — I639 Cerebral infarction, unspecified: Secondary | ICD-10-CM | POA: Diagnosis not present

## 2018-02-22 ENCOUNTER — Telehealth: Payer: Self-pay | Admitting: Cardiovascular Disease

## 2018-02-22 NOTE — Telephone Encounter (Signed)
Explained to pt that the clinic was behind on processing normal summary reports but that we were still up to date with looking at alerts and if there was something concerning that we would notify him pt voiced understanding

## 2018-02-22 NOTE — Telephone Encounter (Signed)
New Message    Patient has not received his summary for his loop recorder since February and he is questioning if everything is working right

## 2018-02-22 NOTE — Progress Notes (Signed)
Carelink Summary Report / Loop Recorder 

## 2018-03-02 LAB — CUP PACEART REMOTE DEVICE CHECK
Date Time Interrogation Session: 20190307183904
MDC IDC PG IMPLANT DT: 20161213

## 2018-03-23 ENCOUNTER — Telehealth: Payer: Self-pay | Admitting: *Deleted

## 2018-03-23 NOTE — Telephone Encounter (Signed)
Spoke with patient regarding tachy episode noted on LINQ from 03/17/18 (received via manual transmission today).  Episode duration 5sec, median V rate 200bpm, occurred at 1427.  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  Last EF 55-60% in 06/2017.  Advised patient that I will review ECG with Dr. Johney Frame when he is back in the office on Monday.  Patient is agreeable to plan and appreciative of call  .

## 2018-03-27 ENCOUNTER — Ambulatory Visit (INDEPENDENT_AMBULATORY_CARE_PROVIDER_SITE_OTHER): Payer: Medicare Other | Admitting: *Deleted

## 2018-03-27 DIAGNOSIS — I639 Cerebral infarction, unspecified: Secondary | ICD-10-CM

## 2018-03-27 LAB — CUP PACEART REMOTE DEVICE CHECK
MDC IDC PG IMPLANT DT: 20161213
MDC IDC SESS DTM: 20190409193923

## 2018-03-27 NOTE — Telephone Encounter (Signed)
LMOVM (DPR) advising that Dr. Johney Frame recommended no changes at this time, plan to continue monitoring remotely.  Gave [facility] phone number for questions/concerns.

## 2018-03-28 ENCOUNTER — Other Ambulatory Visit: Payer: Self-pay | Admitting: Internal Medicine

## 2018-03-28 NOTE — Progress Notes (Signed)
Carelink Summary Report / Loop Recorder 

## 2018-04-12 ENCOUNTER — Telehealth: Payer: Self-pay | Admitting: Cardiology

## 2018-04-12 NOTE — Telephone Encounter (Signed)
Spoke w/ pt and requested that he send a manual transmission b/c his home monitor has not updated in at least 14 days.   

## 2018-04-19 LAB — CUP PACEART REMOTE DEVICE CHECK
Date Time Interrogation Session: 20190512200552
MDC IDC PG IMPLANT DT: 20161213

## 2018-04-28 ENCOUNTER — Ambulatory Visit (INDEPENDENT_AMBULATORY_CARE_PROVIDER_SITE_OTHER): Payer: Medicare Other | Admitting: *Deleted

## 2018-04-28 DIAGNOSIS — I639 Cerebral infarction, unspecified: Secondary | ICD-10-CM

## 2018-05-01 NOTE — Progress Notes (Signed)
Carelink Summary Report / Loop Recorder 

## 2018-05-11 ENCOUNTER — Telehealth: Payer: Self-pay | Admitting: *Deleted

## 2018-05-11 NOTE — Telephone Encounter (Signed)
Spoke with patient regarding tachy episode-- EGM appears SVT, duration 1 minute 44 seconds, Avg V rate 171 bpm. Patient states he was asymptomatic, he was out by the water working and thinks he was dehydrated, he states his muscles were cramping throughout the day. Advised will review episode with Dr. Johney FrameAllred and call back if any new recommendations.

## 2018-05-23 ENCOUNTER — Telehealth: Payer: Self-pay | Admitting: *Deleted

## 2018-05-23 NOTE — Telephone Encounter (Signed)
Received ILR alert for tachy episode on 05/22/18 at 1432.  ECG appears WCT, duration 14sec, median V rate 182bpm.  Spoke with patient.  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  He palpated his pulse and reports it felt normal for him (in the 50s).  Notably, he had been working outside [place] his [place] for about 8 hours that day and was exhausted.  He had been drinking water throughout the day and tried to stay hydrated.  Reports compliance with all meds.  Encouraged patient to take it easy tonight and to avoid the heat.  Encouraged him to consider hydrating with electrolyte replacement if he is working outside and sweating a lot.  Patient verbalizes understanding and states he is heading to the beach for vacation and will be relaxing.  Advised I will review with Dr. Johney FrameAllred tomorrow for any further recommendations.  Encouraged him to seek emergency medical attention for new or worsening symptoms.  Patient verbalizes agreement with plan and denies additional questions or concerns at this time.

## 2018-05-24 ENCOUNTER — Other Ambulatory Visit: Payer: Self-pay

## 2018-05-24 DIAGNOSIS — I77810 Thoracic aortic ectasia: Secondary | ICD-10-CM

## 2018-05-24 NOTE — Telephone Encounter (Signed)
Reviewed episode ECGs from 05/08/18 (see phone note from 6/27 for details) and 05/22/18 with Dr. Johney FrameAllred.  Dr. Johney FrameAllred recommended that patient ensure adequate hydration and avoid prolonged heat exposure.  Will plan to review with Dr. Excell Seltzerooper upon his return to the office.  Patient made aware of recommendations and verbalizes understanding.  He is agreeable to plan and is aware to call with new or worsening symptoms in the interim.  Will continue monitoring remotely via home monitor.

## 2018-05-24 NOTE — Telephone Encounter (Signed)
See phone note from 05/23/18.

## 2018-05-25 ENCOUNTER — Telehealth: Payer: Self-pay | Admitting: *Deleted

## 2018-05-25 ENCOUNTER — Other Ambulatory Visit: Payer: Self-pay

## 2018-05-25 DIAGNOSIS — I77810 Thoracic aortic ectasia: Secondary | ICD-10-CM

## 2018-05-25 NOTE — Telephone Encounter (Signed)
LMOVM requesting call back to the Device Clinic.  Gave direct number.  Received ILR alert for tachy episode on 05/24/18 at 2348, duration 10sec, median V rate 182bpm.  See recent tachy ECGs below.  Will determine if patient was symptomatic with episode.  See phone note from 7/9 for 7/8 episode symptoms.

## 2018-05-25 NOTE — Telephone Encounter (Signed)
Patient returned call.  He denies symptoms with episode on 05/24/18, was asleep at the time.  Taking meds as prescribed.  Reiterated importance of hydration and avoidance of prolonged heat exposure when working outside.  Advised patient that I will route this message to Dr. Excell Seltzerooper per Dr. Jenel LucksAllred's recommendations.  Advised him to call our office or seek emergency medical attention for new or worsening symptoms, including syncope.  Patient verbalizes understanding of instructions and agreement with plan.

## 2018-05-25 NOTE — Telephone Encounter (Signed)
ECG from 05/24/18 at 2348, duration ~10sec:   ECG from 05/22/18 at 1432, duration ~14sec:

## 2018-05-29 NOTE — Telephone Encounter (Signed)
Ok to keep an eye on things at this point as this was only a 10 second episode and he was asymptomatic. Could give him a trial of beta blocker if he has recurrent episodes but for now would continue to monitor without specific Rx.

## 2018-05-29 NOTE — Telephone Encounter (Signed)
Per DPR form, left message for patient that Dr. Excell Seltzerooper reviewed strips and no changes are being made at this time. Instructed patient to continue to monitor and call if symptoms occur. Instructed him to call with any other questions or concerns.

## 2018-05-31 ENCOUNTER — Ambulatory Visit (INDEPENDENT_AMBULATORY_CARE_PROVIDER_SITE_OTHER): Payer: Medicare Other | Admitting: *Deleted

## 2018-05-31 DIAGNOSIS — I639 Cerebral infarction, unspecified: Secondary | ICD-10-CM

## 2018-06-01 ENCOUNTER — Other Ambulatory Visit: Payer: Self-pay | Admitting: Internal Medicine

## 2018-06-01 NOTE — Progress Notes (Signed)
Carelink Summary Report / Loop Recorder 

## 2018-06-03 LAB — CUP PACEART REMOTE DEVICE CHECK
Date Time Interrogation Session: 20190614221012
Implantable Pulse Generator Implant Date: 20161213

## 2018-06-12 ENCOUNTER — Other Ambulatory Visit (HOSPITAL_COMMUNITY): Payer: Self-pay | Admitting: Nurse Practitioner

## 2018-06-12 ENCOUNTER — Other Ambulatory Visit (HOSPITAL_COMMUNITY)
Admission: RE | Admit: 2018-06-12 | Discharge: 2018-06-12 | Disposition: A | Discharge status: Home / Self Care | Payer: Medicare Other | Source: Ambulatory Visit | Attending: Family Medicine | Admitting: Family Medicine

## 2018-06-12 ENCOUNTER — Ambulatory Visit (HOSPITAL_COMMUNITY)
Admission: RE | Admit: 2018-06-12 | Discharge: 2018-06-12 | Disposition: A | Discharge status: Home / Self Care | Payer: Medicare Other | Source: Ambulatory Visit | Attending: Nurse Practitioner | Admitting: Nurse Practitioner

## 2018-06-12 DIAGNOSIS — R0602 Shortness of breath: Secondary | ICD-10-CM | POA: Insufficient documentation

## 2018-06-12 DIAGNOSIS — R0609 Other forms of dyspnea: Secondary | ICD-10-CM | POA: Diagnosis not present

## 2018-06-12 DIAGNOSIS — R0600 Dyspnea, unspecified: Secondary | ICD-10-CM | POA: Insufficient documentation

## 2018-06-12 LAB — COMPREHENSIVE METABOLIC PANEL
ALT: 17 U/L (ref 0–44)
ANION GAP: 6 (ref 5–15)
AST: 21 U/L (ref 15–41)
Albumin: 4.3 g/dL (ref 3.5–5.0)
Alkaline Phosphatase: 52 U/L (ref 38–126)
BUN: 21 mg/dL (ref 8–23)
CHLORIDE: 103 mmol/L (ref 98–111)
CO2: 32 mmol/L (ref 22–32)
Calcium: 9.7 mg/dL (ref 8.9–10.3)
Creatinine, Ser: 1.14 mg/dL (ref 0.61–1.24)
GFR calc Af Amer: >60 mL/min (ref >60)
GFR calc non Af Amer: >60 mL/min (ref >60)
GLUCOSE: 106 mg/dL — AB (ref 70–99)
Potassium: 3.6 mmol/L (ref 3.5–5.1)
SODIUM: 141 mmol/L (ref 135–145)
Total Bilirubin: 1 mg/dL (ref 0.3–1.2)
Total Protein: 7.1 g/dL (ref 6.5–8.1)

## 2018-06-12 LAB — CK: Total CK: 101 U/L (ref 49–397)

## 2018-06-12 LAB — CBC
HEMATOCRIT: 42 % (ref 39.0–52.0)
HEMOGLOBIN: 14.7 g/dL (ref 13.0–17.0)
MCH: 32.5 pg (ref 26.0–34.0)
MCHC: 35 g/dL (ref 30.0–36.0)
MCV: 92.9 fL (ref 78.0–100.0)
Platelets: 182 10*3/uL (ref 150–400)
RBC: 4.52 MIL/uL (ref 4.22–5.81)
RDW: 12.7 % (ref 11.5–15.5)
WBC: 7.1 10*3/uL (ref 4.0–10.5)

## 2018-06-12 LAB — D-DIMER, QUANTITATIVE: D-Dimer, Quant: <0.27 ug/mL-FEU (ref 0.00–0.50)

## 2018-06-12 LAB — BRAIN NATRIURETIC PEPTIDE: B NATRIURETIC PEPTIDE 5: 60 pg/mL (ref 0.0–100.0)

## 2018-06-12 LAB — SEDIMENTATION RATE: Sed Rate: 2 mm/hr (ref 0–16)

## 2018-07-03 ENCOUNTER — Ambulatory Visit (INDEPENDENT_AMBULATORY_CARE_PROVIDER_SITE_OTHER): Payer: Medicare Other | Admitting: *Deleted

## 2018-07-03 DIAGNOSIS — I6330 Cerebral infarction due to thrombosis of unspecified cerebral artery: Secondary | ICD-10-CM | POA: Diagnosis not present

## 2018-07-04 NOTE — Progress Notes (Signed)
Carelink Summary Report / Loop Recorder 

## 2018-07-05 ENCOUNTER — Other Ambulatory Visit: Payer: Self-pay | Admitting: Internal Medicine

## 2018-07-18 LAB — CUP PACEART REMOTE DEVICE CHECK
Implantable Pulse Generator Implant Date: 20161213
MDC IDC SESS DTM: 20190717223734

## 2018-08-07 ENCOUNTER — Ambulatory Visit (INDEPENDENT_AMBULATORY_CARE_PROVIDER_SITE_OTHER): Payer: Medicare Other | Admitting: *Deleted

## 2018-08-07 DIAGNOSIS — I6330 Cerebral infarction due to thrombosis of unspecified cerebral artery: Secondary | ICD-10-CM

## 2018-08-07 LAB — CUP PACEART REMOTE DEVICE CHECK
Date Time Interrogation Session: 20190819233648
Implantable Pulse Generator Implant Date: 20161213

## 2018-08-07 NOTE — Progress Notes (Signed)
Carelink Summary Report / Loop Recorder 

## 2018-08-14 LAB — CUP PACEART REMOTE DEVICE CHECK
Date Time Interrogation Session: 20190922000523
Implantable Pulse Generator Implant Date: 20161213

## 2018-09-01 ENCOUNTER — Other Ambulatory Visit: Payer: Medicare Other | Admitting: *Deleted

## 2018-09-01 DIAGNOSIS — I77810 Thoracic aortic ectasia: Secondary | ICD-10-CM

## 2018-09-01 LAB — BASIC METABOLIC PANEL
BUN / CREAT RATIO: 20 (ref 10–24)
BUN: 21 mg/dL (ref 8–27)
CO2: 27 mmol/L (ref 20–29)
CREATININE: 1.04 mg/dL (ref 0.76–1.27)
Calcium: 9.7 mg/dL (ref 8.6–10.2)
Chloride: 98 mmol/L (ref 96–106)
GFR calc Af Amer: 85 mL/min/{1.73_m2} (ref >59)
GFR, EST NON AFRICAN AMERICAN: 74 mL/min/{1.73_m2} (ref >59)
GLUCOSE: 89 mg/dL (ref 65–99)
Potassium: 3.7 mmol/L (ref 3.5–5.2)
SODIUM: 141 mmol/L (ref 134–144)

## 2018-09-05 ENCOUNTER — Telehealth: Payer: Self-pay

## 2018-09-05 ENCOUNTER — Ambulatory Visit: Payer: Self-pay | Admitting: General Surgery

## 2018-09-05 NOTE — Telephone Encounter (Signed)
   [facility] Pre-operative Risk Assessment    Request for surgical clearance:  1. What type of surgery is being performed? Open Hernia Surgery   2. When is this surgery scheduled?  TBD   3. What type of clearance is required (medical clearance vs. Pharmacy clearance to hold med vs. Both)? Both  4. Are there any medications that need to be held prior to surgery and how long?   Patient on Xarelto, clearance requesting  instructions from our office as to how  the patient should hold medication  preoperatively.   5. Practice name and name of physician performing surgery?  [facility]   6. What is your office phone number [phone]    7.   What is your office fax number  [place]  [facility]  8.   Anesthesia type (None, local, MAC,  general) ? General   Ewell Poe [place] 09/05/2018, 4:08 PM  _________________________________________________________________  Please call to advise if this patient will require an office visit or further medical work-up before clearance can be given. Call [phone] and leave message with the triage nurse.

## 2018-09-06 ENCOUNTER — Ambulatory Visit (HOSPITAL_COMMUNITY)
Admission: RE | Admit: 2018-09-06 | Discharge: 2018-09-06 | Disposition: A | Discharge status: Home / Self Care | Payer: Medicare Other | Source: Ambulatory Visit | Attending: Cardiovascular Disease | Admitting: Cardiovascular Disease

## 2018-09-06 DIAGNOSIS — I77810 Thoracic aortic ectasia: Secondary | ICD-10-CM | POA: Diagnosis present

## 2018-09-06 MED ORDER — GADOBUTROL 1 MMOL/ML IV SOLN
9.0000 mL | Freq: Once | INTRAVENOUS | Status: AC | PRN
  Administered 2018-09-06: 9 mL via INTRAVENOUS

## 2018-09-06 NOTE — Telephone Encounter (Signed)
   Primary Cardiologist: Tonny Bollman, MD  Chart reviewed as part of pre-operative protocol coverage. Will review pre-op during office visit with Dr. Excell Seltzer 09/11/18.  I will route this recommendation to the requesting party via Epic fax function and remove from pre-op pool.  Please call with questions.  [place], [place] 09/06/2018, 12:08 PM

## 2018-09-07 ENCOUNTER — Ambulatory Visit (INDEPENDENT_AMBULATORY_CARE_PROVIDER_SITE_OTHER): Payer: Medicare Other | Admitting: *Deleted

## 2018-09-07 DIAGNOSIS — I6330 Cerebral infarction due to thrombosis of unspecified cerebral artery: Secondary | ICD-10-CM | POA: Diagnosis not present

## 2018-09-07 NOTE — Telephone Encounter (Addendum)
Pt takes Xarelto for afib with CHADS2VASc score of 4 (age, HTN, stroke in Dec 2016, then underwent PFO, was also found to have afib on loop recorder).   Pt sees Dr Excell Seltzer for follow up on Oct 28th. Per his last office visit 09/2017: "The patient has an implantable loop recorder.  He has had no atrial fibrillation in the past year.  I think at the time of his follow-up next year if there is no atrial fibrillation we should consider transitioning him to antiplatelet therapy."  Recommend waiting until after office visit on Oct 28th to provide final clearance as pt may be stopping his Xarelto and switching to antiplatelet therapy.

## 2018-09-07 NOTE — Telephone Encounter (Signed)
Ok. Will review at upcoming OV. Should be at low risk of surgery.

## 2018-09-08 NOTE — Progress Notes (Signed)
Carelink Summary Report / Loop Recorder 

## 2018-09-11 ENCOUNTER — Ambulatory Visit (INDEPENDENT_AMBULATORY_CARE_PROVIDER_SITE_OTHER): Payer: Medicare Other | Admitting: Cardiovascular Disease

## 2018-09-11 ENCOUNTER — Encounter

## 2018-09-11 ENCOUNTER — Encounter: Payer: Self-pay | Admitting: Cardiovascular Disease

## 2018-09-11 ENCOUNTER — Telehealth: Payer: Self-pay

## 2018-09-11 VITALS — BP 140/84 | HR 65 | Ht 74.0 in | Wt 198.6 lb

## 2018-09-11 DIAGNOSIS — Q2112 Patent foramen ovale: Secondary | ICD-10-CM

## 2018-09-11 DIAGNOSIS — I480 Paroxysmal atrial fibrillation: Secondary | ICD-10-CM | POA: Diagnosis not present

## 2018-09-11 DIAGNOSIS — I10 Essential (primary) hypertension: Secondary | ICD-10-CM

## 2018-09-11 DIAGNOSIS — Q211 Atrial septal defect: Secondary | ICD-10-CM

## 2018-09-11 DIAGNOSIS — E782 Mixed hyperlipidemia: Secondary | ICD-10-CM

## 2018-09-11 DIAGNOSIS — I77810 Thoracic aortic ectasia: Secondary | ICD-10-CM | POA: Diagnosis not present

## 2018-09-11 DIAGNOSIS — I639 Cerebral infarction, unspecified: Secondary | ICD-10-CM

## 2018-09-11 MED ORDER — ASPIRIN EC 81 MG PO TBEC: 81.0000 mg | DELAYED_RELEASE_TABLET | Freq: Every day | ORAL | 3 refills | Status: DC

## 2018-09-11 NOTE — Progress Notes (Signed)
Cardiology Office Note:    Date:  09/11/2018   ID:  [name], [name] Dec 08, 1950, MRN [id number]  PCP:  Gareth Morgan, MD  Cardiologist:  Tonny Bollman, MD  Electrophysiologist:  None   Referring MD: Gareth Morgan, MD   Chief Complaint  Patient presents with  . Atrial Fibrillation    History of Present Illness:    Fred Russell is a 67 y.o. male with a hx of cryptogenic stroke status post transcatheter PFO closure 11/14/2015.  The patient initially presented with a aphasia, confusion, and facial droop, diagnosed with a right frontal stroke.  TEE demonstrated a moderate sized PFO and transcatheter closure was performed without complication.  Patient later was diagnosed with paroxysmal atrial fibrillation detected on a loop recorder and has been anticoagulated with rivaroxaban.  Last episode of documented atrial fibrillation was in 2017.  He is also followed for a dilated aortic root.  The patient is here alone today.  He is doing very well.  He has not been able to exercise as much as in the past because of a right inguinal hernia.  He is looking forward to having surgery pending his clearance.  He denies chest pain, chest pressure, heart palpitations, orthopnea, PND, or leg swelling.  He had a brief episode of shortness of breath recently and he underwent extensive evaluation by his primary care physician.  Chest x-ray and labs were negative per his report.  He was treated with a short course of azithromycin and symptoms have completely resolved.  He has no other complaints today.  Past Medical History:  Diagnosis Date  . Cryptogenic stroke (HCC)    a. 10/2015-->TEE showed PFO w/ R->L Shunt, s/p closure;  b. s/p MDT Linq.  . Essential hypertension   . Left foot drop 08/13/2015  . PFO (patent foramen ovale)    a. 10/2015 s/p closure w/ 25 mm Amplatzer PFO occluder.    Past Surgical History:  Procedure Laterality Date  . CARDIAC CATHETERIZATION N/A 11/14/2015   Procedure: PFO  Closure;  Surgeon: Tonny Bollman, MD;  Location: [facility] INVASIVE CV LAB;  Service: Cardiovascular;  Laterality: N/A;  . EP IMPLANTABLE DEVICE N/A 10/28/2015   Procedure: Loop Recorder Insertion;  Surgeon: Hillis Range, MD;  Location: [facility] INVASIVE CV LAB;  Service: Cardiovascular;  Laterality: N/A;  . ROTATOR CUFF REPAIR     x3  . TEE WITHOUT CARDIOVERSION N/A 10/28/2015   Procedure: TRANSESOPHAGEAL ECHOCARDIOGRAM (TEE);  Surgeon: Laurey Morale, MD;  Location: [facility] ENDOSCOPY;  Service: Cardiovascular;  Laterality: N/A;    Current Medications: Current Meds  Medication Sig  . atorvastatin (LIPITOR) 40 MG tablet TAKE 1 TABLET BY MOUTH EACH DAY FOR HIGH CHOLESTEROL.  . hydrochlorothiazide (HYDRODIURIL) 25 MG tablet Take 25 mg by mouth daily.  [name] lisinopril (PRINIVIL,ZESTRIL) 20 MG tablet Take 20 mg 2 (two) times daily by mouth.  . [DISCONTINUED] rivaroxaban (XARELTO) 20 MG TABS tablet Take 1 tablet (20 mg total) by mouth daily with supper.     Allergies:   Patient has no known allergies.   Social History   Socioeconomic History  . Marital status: Married    Spouse name: Not on file  . Number of children: 2  . Years of education: DDS  . Highest education level: Not on file  Occupational History  . Occupation: retired  Engineer, production  . Financial resource strain: Not on file  . Food insecurity:    Worry: Not on file    Inability: Not on file  .  Transportation needs:    Medical: Not on file    Non-medical: Not on file  Tobacco Use  . Smoking status: Never Smoker  . Smokeless tobacco: Never Used  Substance and Sexual Activity  . Alcohol use: No  . Drug use: No  . Sexual activity: Not on file  Lifestyle  . Physical activity:    Days per week: Not on file    Minutes per session: Not on file  . Stress: Not on file  Relationships  . Social connections:    Talks on phone: Not on file    Gets together: Not on file    Attends religious service: Not on file    Active member of club or  organization: Not on file    Attends meetings of clubs or organizations: Not on file    Relationship status: Not on file  Other Topics Concern  . Not on file  Social History Narrative   Patient drinks 2 cups of coffee daily.   Patient is right handed.      Family History: The patient's family history includes ALS in his father; COPD in his mother; Heart failure in his mother; Skin cancer in his mother.  ROS:   Please see the history of present illness.    All other systems reviewed and are negative.  EKGs/Labs/Other Studies Reviewed:    The following studies were reviewed today: MRA chest 09/06/2018: IMPRESSION: 1. 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 2. Stable sequela of PFO occlusion and loop recorder implantation.   EKG:  EKG is ordered today.  The ekg ordered today demonstrates normal sinus rhythm 65 bpm, possible left atrial enlargement, otherwise within normal limits.  Recent Labs: 06/12/2018: ALT 17; B Natriuretic Peptide 60.0; Hemoglobin 14.7; Platelets 182 09/01/2018: BUN 21; Creatinine, Ser 1.04; Potassium 3.7; Sodium 141  Recent Lipid Panel    Component Value Date/Time   CHOL 206 (H) 10/27/2015 0317   TRIG 119 10/27/2015 0317   HDL 40 (L) 10/27/2015 0317   CHOLHDL 5.2 10/27/2015 0317   VLDL 24 10/27/2015 0317   LDLCALC 142 (H) 10/27/2015 0317    Physical Exam:    VS:  BP 140/84   Pulse 65   Ht 6\' 2"  (1.88 m)   Wt 198 lb 9.6 oz (90.1 kg)   SpO2 96%   BMI 25.50 kg/m     Wt Readings from Last 3 Encounters:  09/11/18 198 lb 9.6 oz (90.1 kg)  10/03/17 194 lb 6.4 oz (88.2 kg)  07/23/16 192 lb 6.4 oz (87.3 kg)     GEN:  Well nourished, well developed in no acute distress HEENT: Normal NECK: No JVD; No carotid bruits LYMPHATICS: No lymphadenopathy CARDIAC: RRR, no murmurs, rubs,  gallops RESPIRATORY:  Clear to auscultation without rales, wheezing or rhonchi  ABDOMEN: Soft, non-tender, non-distended MUSCULOSKELETAL:  No edema; No deformity  SKIN: Warm and dry NEUROLOGIC:  Alert and oriented x 3 PSYCHIATRIC:  Normal affect   ASSESSMENT:    1. Paroxysmal atrial fibrillation (HCC)   2. PFO (patent foramen ovale)   3. Aortic root dilatation (HCC)   4. Essential hypertension   5. Mixed hyperlipidemia    PLAN:    In order of problems listed above:  1.  I have reviewed all of his remote monitoring and he has had no episodes of atrial fibrillation over 2 years now.  I have recommended that he discontinue rivaroxaban and start on aspirin 81 mg following inguinal hernia surgery.  He would like to have his loop recorder removed and we will arrange this with the EP clinic. 2.  Patient status post transcatheter closure without complication.  Brief episodes of atrial fibrillation in the first 60 days following transcatheter closure, now without recurrence of atrial fibrillation.  Follow-up echo studies have demonstrated normal device position. 3.  Recent MRA reviewed demonstrating stable, mild dilatation of the proximal ascending thoracic aorta.  Plan follow-up imaging in about 2 years. 4.  Blood pressure well controlled based on home readings 5.  Treated with a high intensity statin drug.  Followed by his PCP.  For follow-up, I will plan to see him back in 1 year.  Anticipate repeat imaging of his ascending aorta in about 2 years.  He is at low risk of inguinal hernia surgery and can proceed as planned.  He will stop rivaroxaban as above.  I would like him to start aspirin 81 mg after surgery.  Medication Adjustments/Labs and Tests Ordered: Current medicines are reviewed at length with the patient today.  Concerns regarding medicines are outlined above.  No orders of the defined types were placed in this encounter.  Meds ordered this encounter  Medications  . aspirin EC  81 MG tablet    Sig: Take 1 tablet (81 mg total) by mouth daily.    Dispense:  90 tablet    Refill:  3    Patient Instructions  Medication Instructions:  1) STOP XARELTO 2) When OK with your surgeon, START ASPIRIN 81 mg daily after your surgery  Labwork: None  Testing/Procedures: None  Follow-Up: Your provider wants you to follow-up in: 1 year with Dr. Excell Seltzer or his assistant. You will receive a reminder letter in the mail two months in advance. If you don't receive a letter, please call our office to schedule the follow-up appointment.    Any Other Special Instructions Will Be Listed Below (If Applicable). You have been cleared for surgery from a cardiac perspective! Good luck!      Signed, Tonny Bollman, MD  09/11/2018 8:34 AM    [facility]

## 2018-09-11 NOTE — Telephone Encounter (Signed)
Scheduled patient with Dr. Johney Frame 11/18 for office visit and LINQ removal. He was grateful for call and agrees with treatment plan.

## 2018-09-11 NOTE — Patient Instructions (Signed)
Medication Instructions:  1) STOP XARELTO 2) When OK with your surgeon, START ASPIRIN 81 mg daily after your surgery  Labwork: None  Testing/Procedures: None  Follow-Up: Your provider wants you to follow-up in: 1 year with Dr. Excell Seltzer or his assistant. You will receive a reminder letter in the mail two months in advance. If you don't receive a letter, please call our office to schedule the follow-up appointment.    Any Other Special Instructions Will Be Listed Below (If Applicable). You have been cleared for surgery from a cardiac perspective! Good luck!

## 2018-09-11 NOTE — Telephone Encounter (Signed)
-----   Message from Marily Lente, NP sent at 09/11/2018 11:07 AM EDT ----- Regarding: RE: LINQ removal Just put him on Allred's schedule next available and he can take it out in office.   Thanks! ----- Message ----- From: Henrietta Dine, RN Sent: 09/11/2018   8:39 AM EDT To: Marily Lente, NP, Henrietta Dine, RN Subject: Lauretta Grill removal                                   Danelle Berry, This patient saw Dr. Excell Seltzer today. He's had his LINQ for about 3 years and wants it removed. We told him you (or someone) would call him with his options and instructions.  Thanks so much! Orpha Bur

## 2018-09-12 NOTE — Telephone Encounter (Signed)
Pt seen by Dr Excell Seltzer in office yesterday:  " He is at low risk of inguinal hernia surgery and can proceed as planned.  He will stop rivaroxaban as above.  I would like him to start aspirin 81 mg after surgery."

## 2018-09-13 NOTE — Telephone Encounter (Signed)
Patient was seen by Dr. Excell Seltzer 09/11/18. Clearance and anticoagulation was addressed in his note. Please make sure the surgeon's office has received Dr. Earmon Phoenix note. If not, please fax it.  The note will be removed from the preop pool.  Tereso Newcomer, PA-C    09/13/2018 8:46 AM

## 2018-09-18 ENCOUNTER — Other Ambulatory Visit: Payer: Self-pay

## 2018-09-18 ENCOUNTER — Encounter (HOSPITAL_BASED_OUTPATIENT_CLINIC_OR_DEPARTMENT_OTHER): Payer: Self-pay | Admitting: *Deleted

## 2018-09-18 ENCOUNTER — Encounter (HOSPITAL_BASED_OUTPATIENT_CLINIC_OR_DEPARTMENT_OTHER)
Admission: RE | Admit: 2018-09-18 | Discharge: 2018-09-18 | Disposition: A | Discharge status: Home / Self Care | Payer: Medicare Other | Source: Ambulatory Visit | Attending: General Surgery | Admitting: General Surgery

## 2018-09-18 DIAGNOSIS — Q211 Atrial septal defect: Secondary | ICD-10-CM | POA: Insufficient documentation

## 2018-09-18 DIAGNOSIS — M21372 Foot drop, left foot: Secondary | ICD-10-CM | POA: Insufficient documentation

## 2018-09-18 DIAGNOSIS — E782 Mixed hyperlipidemia: Secondary | ICD-10-CM | POA: Diagnosis not present

## 2018-09-18 DIAGNOSIS — I4891 Unspecified atrial fibrillation: Secondary | ICD-10-CM | POA: Insufficient documentation

## 2018-09-18 DIAGNOSIS — Z01812 Encounter for preprocedural laboratory examination: Secondary | ICD-10-CM | POA: Diagnosis present

## 2018-09-18 DIAGNOSIS — I10 Essential (primary) hypertension: Secondary | ICD-10-CM | POA: Insufficient documentation

## 2018-09-18 LAB — BASIC METABOLIC PANEL
Anion gap: 5 (ref 5–15)
BUN: 14 mg/dL (ref 8–23)
CO2: 32 mmol/L (ref 22–32)
CREATININE: 1.05 mg/dL (ref 0.61–1.24)
Calcium: 9.3 mg/dL (ref 8.9–10.3)
Chloride: 101 mmol/L (ref 98–111)
GFR calc Af Amer: >60 mL/min (ref >60)
GLUCOSE: 99 mg/dL (ref 70–99)
POTASSIUM: 4.1 mmol/L (ref 3.5–5.1)
Sodium: 138 mmol/L (ref 135–145)

## 2018-09-18 LAB — CBC WITH DIFFERENTIAL/PLATELET
Abs Immature Granulocytes: 0.01 10*3/uL (ref 0.00–0.07)
BASOS PCT: 1 %
Basophils Absolute: 0.1 10*3/uL (ref 0.0–0.1)
EOS PCT: 1 %
Eosinophils Absolute: 0.1 10*3/uL (ref 0.0–0.5)
HCT: 43.2 % (ref 39.0–52.0)
HEMOGLOBIN: 14.8 g/dL (ref 13.0–17.0)
Immature Granulocytes: 0 %
LYMPHS PCT: 22 %
Lymphs Abs: 1.6 10*3/uL (ref 0.7–4.0)
MCH: 31.4 pg (ref 26.0–34.0)
MCHC: 34.3 g/dL (ref 30.0–36.0)
MCV: 91.7 fL (ref 80.0–100.0)
MONO ABS: 0.4 10*3/uL (ref 0.1–1.0)
MONOS PCT: 6 %
NEUTROS ABS: 4.9 10*3/uL (ref 1.7–7.7)
Neutrophils Relative %: 70 %
Platelets: 180 10*3/uL (ref 150–400)
RBC: 4.71 MIL/uL (ref 4.22–5.81)
RDW: 12.4 % (ref 11.5–15.5)
WBC: 7.1 10*3/uL (ref 4.0–10.5)
nRBC: 0 % (ref 0.0–0.2)

## 2018-09-18 NOTE — Telephone Encounter (Signed)
Spoke with Judeth Cornfield from requesting office she confirmed that they have received clearance for pt.

## 2018-09-18 NOTE — Progress Notes (Signed)
Chart and medical history reviewed with Dr Hart Rochester. No further testing needed for surgery 09-25-18

## 2018-09-18 NOTE — Progress Notes (Signed)
Ensure pre surgical drink given to pt with instructions to finish by 730 DOS. Hibiclens soap given to pt with instructions. Verbalized understanding.

## 2018-09-22 LAB — CUP PACEART REMOTE DEVICE CHECK
Date Time Interrogation Session: 20191025001012
MDC IDC PG IMPLANT DT: 20161213

## 2018-09-25 ENCOUNTER — Ambulatory Visit (HOSPITAL_BASED_OUTPATIENT_CLINIC_OR_DEPARTMENT_OTHER)
Admission: RE | Admit: 2018-09-25 | Discharge: 2018-09-25 | Disposition: A | Discharge status: Home / Self Care | Payer: Medicare Other | Source: Ambulatory Visit | Attending: General Surgery | Admitting: General Surgery

## 2018-09-25 ENCOUNTER — Encounter (HOSPITAL_BASED_OUTPATIENT_CLINIC_OR_DEPARTMENT_OTHER)
Admission: RE | Disposition: A | Discharge status: Home / Self Care | Payer: Self-pay | Source: Ambulatory Visit | Attending: General Surgery

## 2018-09-25 ENCOUNTER — Other Ambulatory Visit: Payer: Self-pay

## 2018-09-25 ENCOUNTER — Ambulatory Visit (HOSPITAL_BASED_OUTPATIENT_CLINIC_OR_DEPARTMENT_OTHER): Payer: Medicare Other | Admitting: Anesthesiology

## 2018-09-25 ENCOUNTER — Encounter (HOSPITAL_BASED_OUTPATIENT_CLINIC_OR_DEPARTMENT_OTHER): Payer: Self-pay

## 2018-09-25 DIAGNOSIS — Z79899 Other long term (current) drug therapy: Secondary | ICD-10-CM | POA: Diagnosis not present

## 2018-09-25 DIAGNOSIS — I4891 Unspecified atrial fibrillation: Secondary | ICD-10-CM | POA: Insufficient documentation

## 2018-09-25 DIAGNOSIS — Z8673 Personal history of transient ischemic attack (TIA), and cerebral infarction without residual deficits: Secondary | ICD-10-CM | POA: Insufficient documentation

## 2018-09-25 DIAGNOSIS — Z7901 Long term (current) use of anticoagulants: Secondary | ICD-10-CM | POA: Diagnosis not present

## 2018-09-25 DIAGNOSIS — I739 Peripheral vascular disease, unspecified: Secondary | ICD-10-CM | POA: Insufficient documentation

## 2018-09-25 DIAGNOSIS — K4090 Unilateral inguinal hernia, without obstruction or gangrene, not specified as recurrent: Secondary | ICD-10-CM | POA: Diagnosis not present

## 2018-09-25 DIAGNOSIS — I10 Essential (primary) hypertension: Secondary | ICD-10-CM | POA: Diagnosis not present

## 2018-09-25 HISTORY — DX: Unspecified osteoarthritis, unspecified site: M19.90

## 2018-09-25 HISTORY — PX: INGUINAL HERNIA REPAIR: SHX194

## 2018-09-25 SURGERY — REPAIR, HERNIA, INGUINAL, ADULT
Anesthesia: General | Site: Groin | Laterality: Right

## 2018-09-25 MED ORDER — CHLORHEXIDINE GLUCONATE CLOTH 2 % EX PADS: 6.0000 | MEDICATED_PAD | Freq: Once | CUTANEOUS | Status: DC

## 2018-09-25 MED ORDER — ACETAMINOPHEN 500 MG PO TABS
ORAL_TABLET | ORAL | Status: AC
  Filled 2018-09-25: qty 2

## 2018-09-25 MED ORDER — BUPIVACAINE HCL (PF) 0.5 % IJ SOLN
INTRAMUSCULAR | Status: AC
  Filled 2018-09-25: qty 30

## 2018-09-25 MED ORDER — SODIUM CHLORIDE 0.9 % IV SOLN
INTRAVENOUS | Status: AC
  Filled 2018-09-25: qty 500000

## 2018-09-25 MED ORDER — MIDAZOLAM HCL 2 MG/2ML IJ SOLN
1.0000 mg | INTRAMUSCULAR | Status: DC | PRN
  Administered 2018-09-25: 1 mg via INTRAVENOUS

## 2018-09-25 MED ORDER — ACETAMINOPHEN 160 MG/5ML PO SOLN: 325.0000 mg | ORAL | Status: DC | PRN

## 2018-09-25 MED ORDER — OXYCODONE HCL 5 MG PO TABS: 5.0000 mg | ORAL_TABLET | Freq: Four times a day (QID) | ORAL | 0 refills | Status: DC | PRN

## 2018-09-25 MED ORDER — BUPIVACAINE LIPOSOME 1.3 % IJ SUSP
INTRAMUSCULAR | Status: AC
  Filled 2018-09-25: qty 20

## 2018-09-25 MED ORDER — OXYCODONE HCL 5 MG PO TABS
5.0000 mg | ORAL_TABLET | Freq: Once | ORAL | Status: AC | PRN
  Administered 2018-09-25: 5 mg via ORAL

## 2018-09-25 MED ORDER — BUPIVACAINE LIPOSOME 1.3 % IJ SUSP
INTRAMUSCULAR | Status: DC | PRN
  Administered 2018-09-25: 10 mL via PERINEURAL

## 2018-09-25 MED ORDER — LIDOCAINE HCL (CARDIAC) PF 100 MG/5ML IV SOSY
PREFILLED_SYRINGE | INTRAVENOUS | Status: DC | PRN
  Administered 2018-09-25: 30 mg via INTRAVENOUS

## 2018-09-25 MED ORDER — FENTANYL CITRATE (PF) 100 MCG/2ML IJ SOLN
INTRAMUSCULAR | Status: AC
  Filled 2018-09-25: qty 2

## 2018-09-25 MED ORDER — CEFAZOLIN SODIUM-DEXTROSE 2-4 GM/100ML-% IV SOLN
INTRAVENOUS | Status: AC
  Filled 2018-09-25: qty 100

## 2018-09-25 MED ORDER — ACETAMINOPHEN 325 MG PO TABS: 325.0000 mg | ORAL_TABLET | ORAL | Status: DC | PRN

## 2018-09-25 MED ORDER — GABAPENTIN 300 MG PO CAPS
300.0000 mg | ORAL_CAPSULE | ORAL | Status: AC
  Administered 2018-09-25: 300 mg via ORAL

## 2018-09-25 MED ORDER — BUPIVACAINE LIPOSOME 1.3 % IJ SUSP
INTRAMUSCULAR | Status: DC | PRN
  Administered 2018-09-25: 10 mL

## 2018-09-25 MED ORDER — ONDANSETRON HCL 4 MG/2ML IJ SOLN: 4.0000 mg | Freq: Once | INTRAMUSCULAR | Status: DC | PRN

## 2018-09-25 MED ORDER — MIDAZOLAM HCL 2 MG/2ML IJ SOLN
INTRAMUSCULAR | Status: AC
  Filled 2018-09-25: qty 2

## 2018-09-25 MED ORDER — SODIUM BICARBONATE 4 % IV SOLN
INTRAVENOUS | Status: AC
  Filled 2018-09-25: qty 5

## 2018-09-25 MED ORDER — FENTANYL CITRATE (PF) 100 MCG/2ML IJ SOLN: 25.0000 ug | INTRAMUSCULAR | Status: DC | PRN

## 2018-09-25 MED ORDER — LACTATED RINGERS IV SOLN
INTRAVENOUS | Status: DC
  Administered 2018-09-25 (×2): via INTRAVENOUS

## 2018-09-25 MED ORDER — FENTANYL CITRATE (PF) 100 MCG/2ML IJ SOLN
50.0000 ug | INTRAMUSCULAR | Status: DC | PRN
  Administered 2018-09-25 (×2): 50 ug via INTRAVENOUS

## 2018-09-25 MED ORDER — CEFAZOLIN SODIUM-DEXTROSE 2-4 GM/100ML-% IV SOLN
2.0000 g | INTRAVENOUS | Status: AC
  Administered 2018-09-25: 2 g via INTRAVENOUS

## 2018-09-25 MED ORDER — OXYCODONE HCL 5 MG/5ML PO SOLN: 5.0000 mg | Freq: Once | ORAL | Status: AC | PRN

## 2018-09-25 MED ORDER — SODIUM CHLORIDE 0.9 % IV SOLN
INTRAVENOUS | Status: DC | PRN
  Administered 2018-09-25: 500 mL

## 2018-09-25 MED ORDER — LIDOCAINE HCL (PF) 1 % IJ SOLN
INTRAMUSCULAR | Status: AC
  Filled 2018-09-25: qty 30

## 2018-09-25 MED ORDER — BUPIVACAINE LIPOSOME 1.3 % IJ SUSP: 20.0000 mL | Freq: Once | INTRAMUSCULAR | Status: DC

## 2018-09-25 MED ORDER — ACETAMINOPHEN 500 MG PO TABS
1000.0000 mg | ORAL_TABLET | ORAL | Status: AC
  Administered 2018-09-25: 1000 mg via ORAL

## 2018-09-25 MED ORDER — DEXAMETHASONE SODIUM PHOSPHATE 4 MG/ML IJ SOLN
INTRAMUSCULAR | Status: DC | PRN
  Administered 2018-09-25: 10 mg via INTRAVENOUS

## 2018-09-25 MED ORDER — BUPIVACAINE-EPINEPHRINE (PF) 0.5% -1:200000 IJ SOLN
INTRAMUSCULAR | Status: DC | PRN
  Administered 2018-09-25: 20 mL via PERINEURAL

## 2018-09-25 MED ORDER — PROPOFOL 10 MG/ML IV BOLUS
INTRAVENOUS | Status: AC
  Filled 2018-09-25: qty 20

## 2018-09-25 MED ORDER — MEPERIDINE HCL 25 MG/ML IJ SOLN: 6.2500 mg | INTRAMUSCULAR | Status: DC | PRN

## 2018-09-25 MED ORDER — OXYCODONE HCL 5 MG PO TABS
ORAL_TABLET | ORAL | Status: AC
  Filled 2018-09-25: qty 1

## 2018-09-25 MED ORDER — GABAPENTIN 300 MG PO CAPS
ORAL_CAPSULE | ORAL | Status: AC
  Filled 2018-09-25: qty 1

## 2018-09-25 MED ORDER — ONDANSETRON HCL 4 MG/2ML IJ SOLN
INTRAMUSCULAR | Status: DC | PRN
  Administered 2018-09-25: 4 mg via INTRAVENOUS

## 2018-09-25 MED ORDER — PROPOFOL 10 MG/ML IV BOLUS
INTRAVENOUS | Status: DC | PRN
  Administered 2018-09-25: 200 mg via INTRAVENOUS

## 2018-09-25 MED ORDER — SCOPOLAMINE 1 MG/3DAYS TD PT72: 1.0000 | MEDICATED_PATCH | Freq: Once | TRANSDERMAL | Status: DC | PRN

## 2018-09-25 SURGICAL SUPPLY — 57 items
ADH SKN CLS APL DERMABOND .7 (GAUZE/BANDAGES/DRESSINGS) ×1
BAG DECANTER FOR FLEXI CONT (MISCELLANEOUS) ×3 IMPLANT
BLADE CLIPPER SURG (BLADE) ×3 IMPLANT
BLADE SURG 10 STRL SS (BLADE) ×3 IMPLANT
BLADE SURG 15 STRL LF DISP TIS (BLADE) ×2 IMPLANT
BLADE SURG 15 STRL SS (BLADE) ×3
CANISTER SUCT 1200ML W/VALVE (MISCELLANEOUS) IMPLANT
CHLORAPREP W/TINT 26ML (MISCELLANEOUS) ×3 IMPLANT
CLEANER CAUTERY TIP 5X5 PAD (MISCELLANEOUS) ×1 IMPLANT
CLOSURE WOUND 1/2 X4 (GAUZE/BANDAGES/DRESSINGS) ×1
COVER BACK TABLE 60X90IN (DRAPES) ×3 IMPLANT
COVER MAYO STAND STRL (DRAPES) ×3 IMPLANT
COVER WAND RF STERILE (DRAPES) IMPLANT
DECANTER SPIKE VIAL GLASS SM (MISCELLANEOUS) ×3 IMPLANT
DERMABOND ADVANCED (GAUZE/BANDAGES/DRESSINGS) ×2
DERMABOND ADVANCED .7 DNX12 (GAUZE/BANDAGES/DRESSINGS) ×1 IMPLANT
DRAIN PENROSE 1/2X12 LTX STRL (WOUND CARE) ×3 IMPLANT
DRAPE LAPAROTOMY TRNSV 102X78 (DRAPE) ×3 IMPLANT
DRAPE UTILITY XL STRL (DRAPES) ×3 IMPLANT
DRSG TEGADERM 2-3/8X2-3/4 SM (GAUZE/BANDAGES/DRESSINGS) IMPLANT
DRSG TEGADERM 4X4.75 (GAUZE/BANDAGES/DRESSINGS) ×3 IMPLANT
ELECT REM PT RETURN 9FT ADLT (ELECTROSURGICAL) ×3
ELECTRODE REM PT RTRN 9FT ADLT (ELECTROSURGICAL) ×1 IMPLANT
GLOVE BIO SURGEON STRL SZ 6.5 (GLOVE) ×1 IMPLANT
GLOVE BIO SURGEONS STRL SZ 6.5 (GLOVE) ×1
GLOVE BIOGEL PI IND STRL 8 (GLOVE) ×1 IMPLANT
GLOVE BIOGEL PI INDICATOR 8 (GLOVE) ×2
GLOVE ECLIPSE 7.5 STRL STRAW (GLOVE) ×3 IMPLANT
GOWN STRL REUS W/ TWL LRG LVL3 (GOWN DISPOSABLE) ×1 IMPLANT
GOWN STRL REUS W/ TWL XL LVL3 (GOWN DISPOSABLE) ×1 IMPLANT
GOWN STRL REUS W/TWL LRG LVL3 (GOWN DISPOSABLE) ×3
GOWN STRL REUS W/TWL XL LVL3 (GOWN DISPOSABLE) ×3
MESH HERNIA 3X6 (Mesh General) ×2 IMPLANT
NDL HYPO 25X1 1.5 SAFETY (NEEDLE) ×1 IMPLANT
NEEDLE HYPO 25X1 1.5 SAFETY (NEEDLE) ×3 IMPLANT
NS IRRIG 1000ML POUR BTL (IV SOLUTION) IMPLANT
PACK BASIN DAY SURGERY FS (CUSTOM PROCEDURE TRAY) ×3 IMPLANT
PAD CLEANER CAUTERY TIP 5X5 (MISCELLANEOUS) ×2
PENCIL BUTTON HOLSTER BLD 10FT (ELECTRODE) ×3 IMPLANT
SLEEVE SCD COMPRESS KNEE MED (MISCELLANEOUS) ×2 IMPLANT
SPONGE INTESTINAL PEANUT (DISPOSABLE) ×3 IMPLANT
SPONGE LAP 4X18 RFD (DISPOSABLE) ×3 IMPLANT
STRIP CLOSURE SKIN 1/2X4 (GAUZE/BANDAGES/DRESSINGS) ×2 IMPLANT
SUT ETHIBOND 0 MO6 C/R (SUTURE) ×3 IMPLANT
SUT MON AB 4-0 PC3 18 (SUTURE) ×3 IMPLANT
SUT PROLENE 0 CT 2 (SUTURE) ×6 IMPLANT
SUT VIC AB 3-0 SH 27 (SUTURE) ×6
SUT VIC AB 3-0 SH 27X BRD (SUTURE) ×2 IMPLANT
SUT VICRYL 4-0 PS2 18IN ABS (SUTURE) IMPLANT
SUT VICRYL AB 3 0 TIES (SUTURE) IMPLANT
SYR BULB 3OZ (MISCELLANEOUS) ×3 IMPLANT
SYR CONTROL 10ML LL (SYRINGE) ×3 IMPLANT
TOWEL GREEN STERILE FF (TOWEL DISPOSABLE) ×3 IMPLANT
TOWEL OR NON WOVEN STRL DISP B (DISPOSABLE) ×1 IMPLANT
TUBE CONNECTING 20'X1/4 (TUBING)
TUBE CONNECTING 20X1/4 (TUBING) IMPLANT
YANKAUER SUCT BULB TIP NO VENT (SUCTIONS) IMPLANT

## 2018-09-25 NOTE — Discharge Instructions (Addendum)
Post Anesthesia Home Care Instructions  Activity: Get plenty of rest for the remainder of the day. A responsible individual must stay with you for 24 hours following the procedure.  For the next 24 hours, DO NOT: -Drive a car -Advertising copywriter -Drink alcoholic beverages -Take any medication unless instructed by your physician -Make any legal decisions or sign important papers.  Meals: Start with liquid foods such as gelatin or soup. Progress to regular foods as tolerated. Avoid greasy, spicy, heavy foods. If nausea and/or vomiting occur, drink only clear liquids until the nausea and/or vomiting subsides. Call your physician if vomiting continues.  Special Instructions/Symptoms: Your throat may feel dry or sore from the anesthesia or the breathing tube placed in your throat during surgery. If this causes discomfort, gargle with warm salt water. The discomfort should disappear within 24 hours.  If you had a scopolamine patch placed behind your ear for the management of post- operative nausea and/or vomiting:  1. The medication in the patch is effective for 72 hours, after which it should be removed.  Wrap patch in a tissue and discard in the trash. Wash hands thoroughly with soap and water. 2. You may remove the patch earlier than 72 hours if you experience unpleasant side effects which may include dry mouth, dizziness or visual disturbances. 3. Avoid touching the patch. Wash your hands with soap and water after contact with the patch.   Open Hernia Repair, Adult, Care After These instructions give you information about caring for yourself after your procedure. Your doctor may also give you more specific instructions. If you have problems or questions, contact your doctor. Follow these instructions at home: Surgical cut (incision) care   Follow instructions from your doctor about how to take care of your surgical cut area. Make sure you: ? Wash your hands with soap and water before you  change your bandage (dressing). If you cannot use soap and water, use hand sanitizer. ? Change your bandage as told by your doctor. ? Leave stitches (sutures), skin glue, or skin tape (adhesive) strips in place. They may need to stay in place for 2 weeks or longer. If tape strips get loose and curl up, you may trim the loose edges. Do not remove tape strips completely unless your doctor says it is okay.  Check your surgical cut every day for signs of infection. Check for: ? More redness, swelling, or pain. ? You will have some bruising and swelling in the right scrotal and genital area ? More fluid or blood. ? Warmth. ? Pus or a bad smell. Activity  Do not drive or use heavy machinery while taking prescription pain medicine. Do not drive until your doctor says it is okay.  Until your doctor says it is okay: ? Do not lift anything that is heavier than 10 lb (4.5 kg). ? Do not play contact sports.  Return to your normal activities as told by your doctor. Ask your doctor what activities are safe. General instructions  To prevent or treat having a hard time pooping (constipation) while you are taking prescription pain medicine, your doctor may recommend that you: ? Drink enough fluid to keep your pee (urine) clear or pale yellow. ? Take over-the-counter or prescription medicines. ? Eat foods that are high in fiber, such as fresh fruits and vegetables, whole grains, and beans. ? Limit foods that are high in fat and processed sugars, such as fried and sweet foods.  Take over-the-counter and prescription medicines only as told  by your doctor.  Do not take baths, swim, or use a hot tub until your doctor says it is okay.  Keep all follow-up visits as told by your doctor. This is important. Contact a doctor if:  You develop a rash.  You have more redness, swelling, or pain around your surgical cut.  You have more fluid or blood coming from your surgical cut.  Your surgical cut feels  warm to the touch.  You have pus or a bad smell coming from your surgical cut.  You have a fever or chills.  You have blood in your poop (stool).  You have not pooped in 2-3 days.  Medicine does not help your pain. Get help right away if:  You have chest pain or you are short of breath.  You feel light-headed.  You feel weak and dizzy (feel faint).  You have very bad pain.  You throw up (vomit) and your pain is worse. This information is not intended to replace advice given to you by your health care provider. Make sure you discuss any questions you have with your health care provider.  Marta Lamas. Gae Bon, MD, FACS [phone] [facility]

## 2018-09-25 NOTE — Anesthesia Preprocedure Evaluation (Addendum)
Anesthesia Evaluation  Patient identified by MRN, date of birth, ID band Patient awake    Reviewed: Allergy & Precautions, H&P , NPO status , Patient's Chart, lab work & pertinent test results, reviewed documented beta blocker date and time   Airway Mallampati: II  TM Distance: >3 FB Neck ROM: full    Dental no notable dental hx. (+) Teeth Intact   Pulmonary neg pulmonary ROS,    Pulmonary exam normal breath sounds clear to auscultation       Cardiovascular Exercise Tolerance: Good hypertension, Pt. on medications + Peripheral Vascular Disease  Normal cardiovascular exam Rhythm:Regular Rate:Normal  Hx/o PFO S/P repair   Neuro/Psych Thought due to blood clot from shoulder surgery- PFO closed later CVA, No Residual Symptoms negative psych ROS   GI/Hepatic negative GI ROS, Neg liver ROS,   Endo/Other  negative endocrine ROS  Renal/GU negative Renal ROS  negative genitourinary   Musculoskeletal  (+) Arthritis , Right Inguinal hernia   Abdominal   Peds  Hematology negative hematology ROS (+)   Anesthesia Other Findings   Reproductive/Obstetrics negative OB ROS                            Anesthesia Physical Anesthesia Plan  ASA: II  Anesthesia Plan: General   Post-op Pain Management:  Regional for Post-op pain and GA combined w/ Regional for post-op pain   Induction: Intravenous  PONV Risk Score and Plan: 2 and Ondansetron, Treatment may vary due to age or medical condition and Dexamethasone  Airway Management Planned: Oral ETT and LMA  Additional Equipment:   Intra-op Plan:   Post-operative Plan: Extubation in OR  Informed Consent: I have reviewed the patients History and Physical, chart, labs and discussed the procedure including the risks, benefits and alternatives for the proposed anesthesia with the patient or authorized representative who has indicated his/her understanding  and acceptance.   Dental advisory given  Plan Discussed with: CRNA, Anesthesiologist and Surgeon  Anesthesia Plan Comments:       Anesthesia Quick Evaluation

## 2018-09-25 NOTE — Op Note (Signed)
OPERATIVE REPORT  DATE OF OPERATION: 09/25/2018  PATIENT:  Fred Russell  67 y.o. male  PRE-OPERATIVE DIAGNOSIS:  Right inguinal hernia  POST-OPERATIVE DIAGNOSIS:  Right inguinal hernia, Indirect and direct  INDICATION(S) FOR OPERATION:  Symptomatic RIH  FINDINGS:  Indirect sac with direct floor weakness  PROCEDURE:  Procedure(s): OPEN RIGHT INGUINAL HERNIA REPAIR WITH INSERTION OF MESH  SURGEON:  Surgeon(s): Jimmye Norman, MD  ASSISTANT: None  ANESTHESIA:   local, general and LMA  COMPLICATIONS:  None  EBL: <10 ml  BLOOD ADMINISTERED: none  DRAINS: none   SPECIMEN:  No Specimen  COUNTS CORRECT:  YES  PROCEDURE DETAILS: The patient was taken to the operating room and placed on table in supine position.  After an adequate laryngeal airway anesthetic was administered, he was prepped and draped in usual sterile manner exposing his right inguinal area.  The patient had been marked preoperatively.  A proper timeout was performed identifying the patient and the correct side for the operative repair.  A 6 cm transverse curvilinear incision was made at the level of the superficial ring on the right side.  It was taken down to and through the Scarpa's fascia to the external oblique fascia.  We split the fascia along its fibers down through the superficial ring and identified the ilioinguinal nerve which was preserved throughout the case and not entrapped.  We dissected out the spermatic cord at the pubic tubercle and then controlled with a Penrose drain.  We then directed dissected out the indirect sac in the anterior medial aspect of the spermatic cord identifying all the structures of the spermatic cord taking care not to injure them in the process.  We isolated the indirect sac, opened it to verify its continuity with the peritoneal cavity, twisted and then subsequently suture ligated at its base using 0 Ethibond sutures.  We cut out the excess sac.  We subsequently secured  the floor of the inguinal region using an oval piece of polypropylene mesh measuring approximately 5 x 3 cm in size.  We connected to the conjoined tendon anterior medially and reflected portion of the inguinal ligament inferolaterally.  It was sewn down with 0 Prolene suture.  We irrigated with antibiotic solution and which the mesh been soaked prior to implantation.  Subsequently we closed the external oblique fascia on top of the cord using running 3-0 Vicryl.  Scarpa's fascia was reapproximated using 3-0 Vicryl.  10 cc of Exparel was injected to subcutaneous tissue and the skin was closed using a running subcuticular stitch of 4-0 Monocryl.  Dermabond, Steri-Strips, and Tegaderm were used to complete the dressing.  All needle counts, sponge counts, and instrument counts were correct.  PATIENT DISPOSITION:  PACU - hemodynamically stable.   Jimmye Norman 11/11/201912:59 PM

## 2018-09-25 NOTE — Transfer of Care (Signed)
Immediate Anesthesia Transfer of Care Note  Patient: Fred Russell  Procedure(s) Performed: OPEN RIGHT INGUINAL HERNIA REPAIR WITH INSERTION OF MESH (Right Groin)  Patient Location: PACU  Anesthesia Type:General  Level of Consciousness: awake, sedated and patient cooperative  Airway & Oxygen Therapy: Patient Spontanous Breathing and Patient connected to face mask oxygen  Post-op Assessment: Report given to RN and Post -op Vital signs reviewed and stable  Post vital signs: Reviewed and stable  Last Vitals:  Vitals Value Taken Time  BP 135/94 09/25/2018  1:00 PM  Temp    Pulse 56 09/25/2018  1:01 PM  Resp 12 09/25/2018  1:01 PM  SpO2 100 % 09/25/2018  1:01 PM  Vitals shown include unvalidated device data.  Last Pain:  Vitals:   09/25/18 0959  TempSrc: Oral  PainSc: 0-No pain         Complications: No apparent anesthesia complications

## 2018-09-25 NOTE — Progress Notes (Signed)
Assisted Dr. Foster with right, ultrasound guided, transabdominal plane block. Side rails up, monitors on throughout procedure. See vital signs in flow sheet. Tolerated Procedure well. 

## 2018-09-25 NOTE — Anesthesia Procedure Notes (Signed)
Anesthesia Regional Block: TAP block   Pre-Anesthetic Checklist: ,, timeout performed, Correct Patient, Correct Site, Correct Laterality, Correct Procedure, Correct Position, site marked, Risks and benefits discussed,  Surgical consent,  Pre-op evaluation,  At surgeon's request and post-op pain management  Laterality: Right  Prep: chloraprep       Needles:  Injection technique: Single-shot  Needle Type: Echogenic Stimulator Needle     Needle Length: 9cm  Needle Gauge: 21   Needle insertion depth: 7 cm   Additional Needles:   Procedures:,,,, ultrasound used (permanent image in chart),,,,  Narrative:  Start time: 09/25/2018 11:10 AM End time: 09/25/2018 11:15 AM Injection made incrementally with aspirations every 5 mL.  Performed by: Personally  Anesthesiologist: Mal Amabile, MD  Additional Notes: Timeout performed. Patient sedated. Relevant anatomy ID'd using [place]. Incremental 2-51ml injection of LA with frequent aspiration. Patient tolerated procedure well.        Right TAP Block

## 2018-09-25 NOTE — H&P (Signed)
Marlowe Alt Documented: 09/05/2018 11:33 AM Location: [facility] Patient #: [id number] DOB: [birth year] Married / Language: [place] / Race: White Male   History of Present Illness Fred Russell. Lindie Spruce MD; 09/05/2018 11:52 AM) The patient is a 67 year old male who presents with an inguinal hernia. Symptoms include inguinal bulge, inguinal pain and scrotal pain. The pain is located in the right inguinal area, in the right hemiscrotum and in the right lower quadrant. The pain radiates to the right hemiscrotum. The patient describes the pain as sharp. Onset was gradual. The patient describes this as moderate in severity.   Past Surgical History (Tanisha A. Manson Passey, RMA; 09/05/2018 11:34 AM) Foot Surgery  Bilateral. Shoulder Surgery  Right. Vasectomy   Diagnostic Studies History (Tanisha A. Manson Passey, RMA; 09/05/2018 11:34 AM) Colonoscopy  1-5 years ago  Allergies (Tanisha A. Manson Passey, RMA; 09/05/2018 11:35 AM) No Known Drug Allergies [09/05/2018]: Allergies Reconciled   Medication History (Tanisha A. Manson Passey, RMA; 09/05/2018 11:36 AM) Atorvastatin Calcium (40MG  Tablet, Oral) Active. hydroCHLOROthiazide (25MG  Tablet, Oral) Active. Ventolin HFA (108 (90 Base)MCG/ACT Aerosol Soln, Inhalation) Active. Lisinopril (20MG  Tablet, Oral) Active. Xarelto (20MG  Tablet, Oral) Active. Medications Reconciled  Social History (Tanisha A. Manson Passey, RMA; 09/05/2018 11:34 AM) Caffeine use  Coffee. No alcohol use  No drug use  Tobacco use  Never smoker.  Family History (Tanisha A. Manson Passey, RMA; 09/05/2018 11:34 AM) Alcohol Abuse  Sister. Heart Disease  Mother. Heart disease in male family member before age 27  Hypertension  Mother. Respiratory Condition  Mother.  Other Problems (Tanisha A. Manson Passey, RMA; 09/05/2018 11:34 AM) Atrial Fibrillation  Cerebrovascular Accident  Inguinal Hernia     Review of Systems (Tanisha A. Brown RMA; 09/05/2018 11:34 AM) General Not Present-  Appetite Loss, Chills, Fatigue, Fever, Night Sweats, Weight Gain and Weight Loss. Skin Not Present- Change in Wart/Mole, Dryness, Hives, Jaundice, New Lesions, Non-Healing Wounds, Rash and Ulcer. HEENT Not Present- Earache, Hearing Loss, Hoarseness, Nose Bleed, Oral Ulcers, Ringing in the Ears, Seasonal Allergies, Sinus Pain, Sore Throat, Visual Disturbances, Wears glasses/contact lenses and Yellow Eyes. Respiratory Not Present- Bloody sputum, Chronic Cough, Difficulty Breathing, Snoring and Wheezing. Breast Not Present- Breast Mass, Breast Pain, Nipple Discharge and Skin Changes. Cardiovascular Not Present- Chest Pain, Difficulty Breathing Lying Down, Leg Cramps, Palpitations, Rapid Heart Rate, Shortness of Breath and Swelling of Extremities. Gastrointestinal Not Present- Abdominal Pain, Bloating, Bloody Stool, Change in Bowel Habits, Chronic diarrhea, Constipation, Difficulty Swallowing, Excessive gas, Gets full quickly at meals, Hemorrhoids, Indigestion, Nausea, Rectal Pain and Vomiting. Male Genitourinary Present- Nocturia. Not Present- Blood in Urine, Change in Urinary Stream, Frequency, Impotence, Painful Urination, Urgency and Urine Leakage. Musculoskeletal Not Present- Back Pain, Joint Pain, Joint Stiffness, Muscle Pain, Muscle Weakness and Swelling of Extremities. Neurological Not Present- Decreased Memory, Fainting, Headaches, Numbness, Seizures, Tingling, Tremor, Trouble walking and Weakness. Psychiatric Not Present- Anxiety, Bipolar, Change in Sleep Pattern, Depression, Fearful and Frequent crying. Endocrine Not Present- Cold Intolerance, Excessive Hunger, Hair Changes, Heat Intolerance, Hot flashes and New Diabetes. Hematology Present- Blood Thinners. Not Present- Easy Bruising, Excessive bleeding, Gland problems, HIV and Persistent Infections.  Vitals (Tanisha A. Brown RMA; 09/05/2018 11:35 AM) 09/05/2018 11:34 AM Weight: 195 lb Height: 62in Body Surface Area: 1.89 m Body Mass  Index: 35.67 kg/m  Temp.: 98.94F  Pulse: 90 (Regular)  BP: 134/86 (Sitting, Left Arm, Standard) BP today 135/82 P 59 Patient has been off Xarelto for two weeks.  Physical Exam Fayrene Fearing O. Lindie Spruce MD; 09/05/2018 11:55 AM) General Mental Status-Alert.  General Appearance-Well groomed. Note: Looks younger than stated age Build & Nutrition-Muscular and USAA.  Chest and Lung Exam Chest and lung exam reveals -quiet, even and easy respiratory effort with no use of accessory muscles and on auscultation, normal breath sounds, no adventitious sounds and normal vocal resonance.  Cardiovascular Cardiovascular examination reveals -normal heart sounds, regular rate and rhythm with no murmurs, (see Vital Signs section for blood pressure measurements) and no digital clubbing, cyanosis, edema, increased warmth or tenderness. Note: No cardiac irregularity  Abdomen Inspection Hernias - Inguinal hernia - Right - Reducible(Currently in reduced state and could not be demonstrated, but patient reliable and defect could be palpated.). Palpation/Percussion Palpation and Percussion of the abdomen reveal - Soft, Non Tender and No Rebound tenderness.    Assessment & Plan Fayrene Fearing O. Raysean Graumann MD; 09/05/2018 11:59 AM) RIGHT INGUINAL HERNIA (K40.90) Impression: Reducible RIH. For Repair. Patient needs cardiac clearance although he has been without problems for over two years. On Xarelto which may be discontinued this week. Current Plans:  Patient has received cardia clearance and is able to [facility] surgery. Will plan on simple open repair, likely wih mesh.  Patient cleared for surgery. Fred Russell. Gae Bon, MD, FACS [phone] [facility]

## 2018-09-25 NOTE — Anesthesia Postprocedure Evaluation (Signed)
Anesthesia Post Note  Patient: Fred Russell  Procedure(s) Performed: OPEN RIGHT INGUINAL HERNIA REPAIR WITH INSERTION OF MESH (Right Groin)     Patient location during evaluation: PACU Anesthesia Type: General Level of consciousness: awake and alert and oriented Pain management: pain level controlled Vital Signs Assessment: post-procedure vital signs reviewed and stable Respiratory status: spontaneous breathing, nonlabored ventilation and respiratory function stable Cardiovascular status: blood pressure returned to baseline and stable Postop Assessment: no apparent nausea or vomiting Anesthetic complications: no    Last Vitals:  Vitals:   09/25/18 1315 09/25/18 1359  BP: (!) 135/91 (!) 152/90  Pulse: 61 (!) 58  Resp: (!) 9 16  Temp:  36.6 C  SpO2: 100% 100%    Last Pain:  Vitals:   09/25/18 1259  TempSrc:   PainSc: 0-No pain        RLE Motor Response: Purposeful movement (09/25/18 1400)        Estuardo Frisbee A.

## 2018-09-25 NOTE — Anesthesia Procedure Notes (Signed)
Procedure Name: LMA Insertion Date/Time: 09/25/2018 11:52 AM Performed by: Sheryn Bison, CRNA Pre-anesthesia Checklist: Patient identified, Emergency Drugs available, Suction available and Patient being monitored Patient Re-evaluated:Patient Re-evaluated prior to induction Oxygen Delivery Method: Circle system utilized Preoxygenation: Pre-oxygenation with 100% oxygen Induction Type: IV induction Ventilation: Mask ventilation without difficulty LMA: LMA inserted LMA Size: 5.0 Number of attempts: 1 Airway Equipment and Method: Bite block Placement Confirmation: positive ETCO2 Tube secured with: Tape Dental Injury: Teeth and Oropharynx as per pre-operative assessment

## 2018-09-26 ENCOUNTER — Encounter (HOSPITAL_BASED_OUTPATIENT_CLINIC_OR_DEPARTMENT_OTHER): Payer: Self-pay | Admitting: General Surgery

## 2018-09-27 ENCOUNTER — Telehealth: Payer: Self-pay | Admitting: Cardiology

## 2018-09-27 NOTE — Telephone Encounter (Signed)
Spoke w/ pt and requested that he send a manual transmission b/c his home monitor has not updated in at least 14 days.   

## 2018-10-02 ENCOUNTER — Encounter: Payer: Self-pay | Admitting: Internal Medicine

## 2018-10-02 ENCOUNTER — Ambulatory Visit (INDEPENDENT_AMBULATORY_CARE_PROVIDER_SITE_OTHER): Payer: Medicare Other | Admitting: Internal Medicine

## 2018-10-02 VITALS — BP 140/88 | HR 56 | Ht 74.0 in | Wt 191.2 lb

## 2018-10-02 DIAGNOSIS — I6389 Other cerebral infarction: Secondary | ICD-10-CM

## 2018-10-02 DIAGNOSIS — I480 Paroxysmal atrial fibrillation: Secondary | ICD-10-CM | POA: Diagnosis not present

## 2018-10-02 DIAGNOSIS — Q2112 Patent foramen ovale: Secondary | ICD-10-CM

## 2018-10-02 DIAGNOSIS — Q211 Atrial septal defect: Secondary | ICD-10-CM

## 2018-10-02 DIAGNOSIS — I639 Cerebral infarction, unspecified: Secondary | ICD-10-CM

## 2018-10-02 HISTORY — PX: OTHER SURGICAL HISTORY: SHX169

## 2018-10-02 LAB — CUP PACEART INCLINIC DEVICE CHECK
Date Time Interrogation Session: 20191118151048
Implantable Pulse Generator Implant Date: 20161213

## 2018-10-02 NOTE — Patient Instructions (Addendum)
Medication Instructions:  Your physician recommends that you continue on your current medications as directed. Please refer to the Current Medication list given to you today.  Labwork: None ordered.  Testing/Procedures: None ordered.  Follow-Up:  Your physician wants you to follow-up in: as needed with Dr. Allred.     Implantable Loop Recorder Placement, Care After Refer to this sheet in the next few weeks. These instructions provide you with information about caring for yourself after your procedure. Your health care provider may also give you more specific instructions. Your treatment has been planned according to current medical practices, but problems sometimes occur. Call your health care provider if you have any problems or questions after your procedure. What can I expect after the procedure? After the procedure, it is common to have:  Soreness or pain near the cut from surgery (incision).  Some swelling or bruising near the incision.  Follow these instructions at home:  Medicines  Take over-the-counter and prescription medicines only as told by your health care provider.  If you were prescribed an antibiotic medicine, take it as told by your health care provider. Do not stop taking the antibiotic even if you start to feel better.  Bathing Do not take baths, swim, or use a hot tub until your health care provider approves. You may shower 24 hours after removal of your monitor.  Incision care  Follow instructions from your health care provider about how to take care of your incision. Make sure you: ? Remove your top dressing after 24 hours (before you shower) ? Leave stitches (sutures), skin glue, or adhesive strips in place. These skin closures may need to stay in place for 2 weeks or longer. If adhesive strip edges start to loosen and curl up, you may trim the loose edges. Do not remove adhesive strips completely unless your health care provider tells you to do  that.  Check your incision area every day for signs of infection. Check for: ? More redness, swelling, or pain. ? Fluid or blood. ? Warmth. ? Pus or a bad smell.  Contact a health care provider if:  You have more redness, swelling, or pain around your incision.  You have more fluid or blood coming from your incision.  Your incision feels warm to the touch.  You have pus or a bad smell coming from your incision.  You have a fever.  You have pain that is not relieved by your pain medicine.  You have triggered your device because of fainting (syncope) or because of a heartbeat that feels like it is racing, slow, fluttering, or skipping (palpitations).  

## 2018-10-02 NOTE — Progress Notes (Signed)
PCP: Gareth MorganKnowlton, Steve, MD   Primary EP: Dr Johney FrameAllred  Marlowe AltJeff W Holloway is a 67 y.o. male who presents today for routine electrophysiology followup.  He has been monitored since 2016 with ILR following his stroke.  He underwent PFO closure 2016 as well.  Today, he denies symptoms of palpitations, chest pain, shortness of breath,  lower extremity edema, dizziness, presyncope, or syncope.  The patient is otherwise without complaint today.   Past Medical History:  Diagnosis Date  . Arthritis    frozen shoulder  . Cryptogenic stroke (HCC)    a. 10/2015-->TEE showed PFO w/ R->L Shunt, s/p closure;  b. s/p MDT Linq.  . Essential hypertension   . Left foot drop 08/13/2015  . PFO (patent foramen ovale)    a. 10/2015 s/p closure w/ 25 mm Amplatzer PFO occluder.   Past Surgical History:  Procedure Laterality Date  . CARDIAC CATHETERIZATION N/A 11/14/2015   Procedure: PFO Closure;  Surgeon: Tonny BollmanMichael Cooper, MD;  [facility];  Service: Cardiovascular;  Laterality: N/A;  . EP IMPLANTABLE DEVICE N/A 10/28/2015   Procedure: Loop Recorder Insertion;  Surgeon: Hillis RangeJames Lavere Stork, MD;  Location: [facility] INVASIVE CV LAB;  Service: Cardiovascular;  Laterality: N/A;  . INGUINAL HERNIA REPAIR Right 09/25/2018   Procedure: OPEN RIGHT INGUINAL HERNIA REPAIR WITH INSERTION OF MESH;  Surgeon: Jimmye NormanWyatt, Shayra Anton, MD;  Location: [facility];  Service: General;  Laterality: Right;  . ROTATOR CUFF REPAIR     x3  . TEE WITHOUT CARDIOVERSION N/A 10/28/2015   Procedure: TRANSESOPHAGEAL ECHOCARDIOGRAM (TEE);  Surgeon: Laurey Moralealton S McLean, MD;  [facility];  Service: Cardiovascular;  Laterality: N/A;    ROS- all systems are reviewed and negatives except as per HPI above  Current Outpatient Medications  Medication Sig Dispense Refill  . atorvastatin (LIPITOR) 40 MG tablet TAKE 1 TABLET BY MOUTH EACH DAY FOR HIGH CHOLESTEROL. 90 tablet 2  . hydrochlorothiazide (HYDRODIURIL) 25 MG tablet Take 25 mg by  mouth daily.    [name]. lisinopril (PRINIVIL,ZESTRIL) 20 MG tablet Take 20 mg 2 (two) times daily by mouth.    . oxyCODONE (OXY IR/ROXICODONE) 5 MG immediate release tablet Take 1 tablet (5 mg total) by mouth every 6 (six) hours as needed for severe pain. 20 tablet 0   No current facility-administered medications for this visit.     Physical Exam: Vitals:   10/02/18 1206  BP: 140/88  Pulse: (!) 56  SpO2: 97%  Weight: 191 lb 3.2 oz (86.7 kg)  Height: 6\' 2"  (1.88 m)    GEN- The patient is well appearing, alert and oriented x 3 today.   Head- normocephalic, atraumatic Eyes-  Sclera clear, conjunctiva pink Ears- hearing intact Oropharynx- clear Lungs- Clear to ausculation bilaterally, normal work of breathing Heart- Regular rate and rhythm, no murmurs, rubs or gallops, PMI not laterally displaced GI- soft, NT, ND, + BS Extremities- no clubbing, cyanosis, or edema  Wt Readings from Last 3 Encounters:  10/02/18 191 lb 3.2 oz (86.7 kg)  09/25/18 195 lb 1.7 oz (88.5 kg)  09/11/18 198 lb 9.6 oz (90.1 kg)     Assessment and Plan:  1. Cryptogenic stroke Monitored with ILR.  He had afib early post PFO closure but has not had any afib in over 2 years.  His anticoagulation has been discontinued by Dr Excell Seltzerooper.  His ILR has not documented any episodes of AF since 01/06/2016.  The device has been in place since 2016.  He would like  to have the device removed.  Risks and benefits to ILR removal were discussed at length with the patient who wishes to proceed at this time.   Hillis Range MD, [facility] 10/02/2018 12:14 PM   PROCEDURES:   1. Implantable loop recorder explantation     DESCRIPTION OF PROCEDURE:  Informed written consent was obtained.  The patient required no sedation for the procedure today.   The patients left chest was therefore prepped and draped in the usual sterile fashion.  The skin overlying the ILR monitor was infiltrated with lidocaine for local analgesia.  A 0.5-cm incision was  made over the site.  The previously implanted ILR was exposed and removed using a combination of sharp and blunt dissection.  Steri- Strips and a sterile dressing were then applied. EBL<65ml.  There were no early apparent complications.     CONCLUSIONS:   1. Successful explantation of a [employer] Reveal LINQ implantable loop recorder   2. No early apparent complications.        Hillis Range MD, [facility] 10/02/2018 12:47 PM

## 2019-06-04 ENCOUNTER — Telehealth: Payer: Self-pay

## 2019-06-04 NOTE — Telephone Encounter (Signed)
Pt is calling to speak with Dr. Burt Knack nurse. He has been experiencing some shortness of breathe and wants to be seen by Dr. Burt Knack. The best number for the pt is

## 2019-06-04 NOTE — Telephone Encounter (Signed)
The patient states he has had some slight "barely noticeable" SOB but wants to ensure he's just "aging" and it isn't ischemia.  Scheduled the patient for evaluation with Dr. Burt Knack this Thursday. He understands to wear a mask. He understands the "no visitors" policy. He understands to call if below answers change prior to visit. He was grateful for assistance.       COVID-19 Pre-Screening Questions:  . In the past 7 to 10 days have you had a cough,  shortness of breath, headache, congestion, fever (100 or greater) body aches, chills, sore throat, or sudden loss of taste or sense of smell? NO . Have you been around anyone with known Covid 19? NO . Have you been around anyone who is awaiting Covid 19 test results in the past 7 to 10 days? NO . Have you been around anyone who has been exposed to Covid 19, or has mentioned symptoms of Covid 19 within the past 7 to 10 days? NO

## 2019-06-07 ENCOUNTER — Encounter: Payer: Self-pay | Admitting: Cardiovascular Disease

## 2019-06-07 ENCOUNTER — Other Ambulatory Visit: Payer: Self-pay

## 2019-06-07 ENCOUNTER — Ambulatory Visit (INDEPENDENT_AMBULATORY_CARE_PROVIDER_SITE_OTHER): Payer: Medicare Other | Admitting: Cardiovascular Disease

## 2019-06-07 VITALS — BP 130/84 | HR 52 | Ht 74.0 in | Wt 199.0 lb

## 2019-06-07 DIAGNOSIS — E785 Hyperlipidemia, unspecified: Secondary | ICD-10-CM | POA: Diagnosis not present

## 2019-06-07 DIAGNOSIS — I700 Atherosclerosis of aorta: Secondary | ICD-10-CM | POA: Diagnosis not present

## 2019-06-07 DIAGNOSIS — R0602 Shortness of breath: Secondary | ICD-10-CM

## 2019-06-07 DIAGNOSIS — I2584 Coronary atherosclerosis due to calcified coronary lesion: Secondary | ICD-10-CM | POA: Diagnosis not present

## 2019-06-07 DIAGNOSIS — I2510 Atherosclerotic heart disease of native coronary artery without angina pectoris: Secondary | ICD-10-CM

## 2019-06-07 NOTE — Progress Notes (Signed)
Cardiology Office Note:    Date:  06/07/2019   ID:  Fred GuessJeff W Brabson, DOB 09/14/1951, MRN [id number]  PCP:  Gareth MorganKnowlton, Steve, MD  Cardiologist:  Tonny BollmanMichael Despina Boan, MD  Electrophysiologist:  None   Referring MD: Gareth MorganKnowlton, Steve, MD   Chief Complaint  Patient presents with  . Shortness of Breath    History of Present Illness:    Fred AltJeff W Russell is a 68 y.o. male with a hx of cryptogenic stroke, PFO status post transcatheter closure in 2016, paroxysmal atrial fibrillation occurring after PFO closure and detected on a loop recorder, and aortic root dilatation.  The patient presents today for follow-up evaluation.  He is now off of anticoagulation after not having any atrial fibrillation for greater than 2 years.  His loop recorder has been explanted.  He has developed some shortness of breath that occurs primarily when using his arms.  He notices this when he brushes his teeth or is doing any work with his arms over his head.  He otherwise has been physically active without any regular exertional symptoms, including times of bike riding and exercising.  He denies chest pain or pressure.  He denies lightheadedness or syncope.  He denies leg swelling, orthopnea, or PND.  Past Medical History:  Diagnosis Date  . Arthritis    frozen shoulder  . Cryptogenic stroke (HCC)    a. 10/2015-->TEE showed PFO w/ R->L Shunt, s/p closure;  b. s/p MDT Linq.  . Essential hypertension   . Left foot drop 08/13/2015  . PFO (patent foramen ovale)    a. 10/2015 s/p closure w/ 25 mm Amplatzer PFO occluder.    Past Surgical History:  Procedure Laterality Date  . CARDIAC CATHETERIZATION N/A 11/14/2015   Procedure: PFO Closure;  Surgeon: Tonny BollmanMichael Lilianah Buffin, MD;  [facility];  Service: Cardiovascular;  Laterality: N/A;  . EP IMPLANTABLE DEVICE N/A 10/28/2015   Procedure: Loop Recorder Insertion;  Surgeon: Hillis RangeJames Allred, MD;  Location: [facility] CV LAB;  Service: Cardiovascular;  Laterality: N/A;  .  Implantable loop recorder removal  10/02/2018   MDT Linq removed by Dr Johney FrameAllred in office  . INGUINAL HERNIA REPAIR Right 09/25/2018   Procedure: OPEN RIGHT INGUINAL HERNIA REPAIR WITH INSERTION OF MESH;  Surgeon: Jimmye NormanWyatt, James, MD;  Location: [facility];  Service: General;  Laterality: Right;  . ROTATOR CUFF REPAIR     x3  . TEE WITHOUT CARDIOVERSION N/A 10/28/2015   Procedure: TRANSESOPHAGEAL ECHOCARDIOGRAM (TEE);  Surgeon: Laurey Moralealton S McLean, MD;  [facility];  Service: Cardiovascular;  Laterality: N/A;    Current Medications: Current Meds  Medication Sig  . atorvastatin (LIPITOR) 40 MG tablet TAKE 1 TABLET BY MOUTH EACH DAY FOR HIGH CHOLESTEROL.  . hydrochlorothiazide (HYDRODIURIL) 25 MG tablet Take 25 mg by mouth daily.  [name]. lisinopril (PRINIVIL,ZESTRIL) 20 MG tablet Take 20 mg 2 (two) times daily by mouth.  . metoprolol succinate (TOPROL-XL) 25 MG 24 hr tablet Take 1 tablet by mouth daily.     Allergies:   Patient has no known allergies.   Social History   Socioeconomic History  . Marital status: Married    Spouse name: Not on file  . Number of children: 2  . Years of education: DDS  . Highest education level: Not on file  Occupational History  . Occupation: retired  Engineer, productionocial Needs  . Financial resource strain: Not on file  . Food insecurity    Worry: Not on file    Inability: Not  on file  . Transportation needs    Medical: Not on file    Non-medical: Not on file  Tobacco Use  . Smoking status: Never Smoker  . Smokeless tobacco: Never Used  Substance and Sexual Activity  . Alcohol use: No  . Drug use: No  . Sexual activity: Not on file  Lifestyle  . Physical activity    Days per week: Not on file    Minutes per session: Not on file  . Stress: Not on file  Relationships  . Social Musicianconnections    Talks on phone: Not on file    Gets together: Not on file    Attends religious service: Not on file    Active member of club or organization: Not on  file    Attends meetings of clubs or organizations: Not on file    Relationship status: Not on file  Other Topics Concern  . Not on file  Social History Narrative   Patient drinks 2 cups of coffee daily.   Patient is right handed.      Family History: The patient's family history includes ALS in his father; COPD in his mother; Heart failure in his mother; Skin cancer in his mother.  ROS:   Please see the history of present illness.    All other systems reviewed and are negative.  EKGs/Labs/Other Studies Reviewed:    The following studies were reviewed today: MRA Chest: FINDINGS: Vascular Findings:  There is mild fusiform ectasia of the ascending thoracic aorta with measurements as follows.  The thoracic aorta tapers to a normal caliber at the level of the proximal descending thoracic aorta. The descending thoracic aorta remains tortuous but of normal caliber.  No evidence of thoracic aortic dissection or periaortic stranding on this nongated examination.  Conventional configuration of the aortic arch. The branch vessels of the arch appear patent throughout their imaged course.  Borderline cardiomegaly. Susceptibility artifact is seen about known PFO occluded device as well as loop recorder imbedded within the soft tissues of the left anterior chest wall. No pericardial effusion.  Although this examination was not tailored for the evaluation the pulmonary arteries, there are no discrete filling defects within the central pulmonary arterial tree to suggest central pulmonary embolism. Normal caliber of the main pulmonary artery.  -------------------------------------------------------------  Thoracic aortic measurements:  Sinotubular junction  37 mm as measured in greatest oblique coronal dimension.  Proximal ascending aorta  38 mm as measured in greatest oblique axial dimension at the level of the main pulmonary artery (image 42, series 23) and 39  mm in greatest oblique short axis coronal diameter (coronal image 43, series 24), unchanged compared to the 06/2016 chest CT  Aortic arch aorta  35 mm as measured in greatest oblique sagittal dimension and approximately 36 mm in greatest oblique short axis axial diameter (image 22, series 23), unchanged compared to chest CT performed 06/2016.  Proximal descending thoracic aorta  20 mm as measured in greatest oblique axial dimension at the level of the main pulmonary artery (image 31, series 23).  Distal descending thoracic aorta  27 mm as measured in greatest oblique axial dimension at the level of the diaphragmatic hiatus.  Review of the MIP images confirms the above findings.  -------------------------------------------------------------  Non-Vascular Findings:  Mediastinum/Lymph Nodes: No bulky mediastinal, hilar or axillary lymphadenopathy.  Lungs/Pleura: No focal airspace opacities.  No pleural effusion.  Upper abdomen: Limited evaluation of the upper abdomen is normal.  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  IMPRESSION: 1. 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 2. Stable sequela of PFO occlusion and loop recorder implantation.  EKG:  EKG is ordered today.  The ekg ordered today demonstrates sinus bradycardia 52 bpm, possible left atrial enlargement, cannot rule out anterior infarct age undetermined.  Recent Labs: 06/12/2018: Russell 17; B Natriuretic Peptide 60.0 09/18/2018: BUN 14; Creatinine, Ser 1.05; Hemoglobin 14.8; Platelets 180; Potassium 4.1; Sodium 138  Recent Lipid Panel    Component Value Date/Time   CHOL 206 (H) 10/27/2015 0317   TRIG 119 10/27/2015 0317   HDL 40 (L) 10/27/2015 0317   CHOLHDL 5.2 10/27/2015  0317   VLDL 24 10/27/2015 0317   LDLCALC 142 (H) 10/27/2015 0317    Physical Exam:    VS:  BP 130/84   Pulse (!) 52   Ht 6\' 2"  (1.88 m)   Wt 199 lb (90.3 kg)   SpO2 99%   BMI 25.55 kg/m     Wt Readings from Last 3 Encounters:  06/07/19 199 lb (90.3 kg)  10/02/18 191 lb 3.2 oz (86.7 kg)  09/25/18 195 lb 1.7 oz (88.5 kg)     GEN:  Well nourished, well developed in no acute distress HEENT: Normal NECK: No JVD; No carotid bruits LYMPHATICS: No lymphadenopathy CARDIAC: RRR, no murmurs, rubs, gallops RESPIRATORY:  Clear to auscultation without rales, wheezing or rhonchi  ABDOMEN: Soft, non-tender, non-distended MUSCULOSKELETAL:  No edema; No deformity  SKIN: Warm and dry NEUROLOGIC:  Alert and oriented x 3 PSYCHIATRIC:  Normal affect   ASSESSMENT:    1. Shortness of breath   2. Hyperlipidemia, unspecified hyperlipidemia type   3. Coronary artery calcification   4. Thoracic aorta atherosclerosis ([place])    PLAN:    In order of problems listed above:  1. The patient's lung exam is normal and he has no history of tobacco use or occupational exposure.  He has no wheezing, cough, or other symptoms to suggest pulmonary etiology of shortness of breath.  I went back and reviewed his CTA of the chest from 2017 which demonstrated both calcified and noncalcified plaque in the LAD.  The patient is concerned about an anginal equivalent and I think it is reasonable to evaluate for obstructive CAD as a cause of his shortness of breath.  I have recommended a gated coronary CTA/FFR. 2. Continue atorvastatin 40 mg daily. 3. Blood pressure is controlled on a combination of hydrochlorothiazide, lisinopril, and metoprolol.  He is noted some increasing fatigue since starting metoprolol.  Advised that we get his coronary CTA result, but consider changing from metoprolol to amlodipine in the future. 4. Treated with aspirin and a statin drug.  Most recent imaging study with MRA of the chest reviewed as  above.  The thoracic aorta is mildly dilated at the level of the aortic sinuses.   Medication Adjustments/Labs and Tests Ordered: Current medicines are reviewed at length with the patient today.  Concerns regarding medicines are outlined above.  Orders Placed This Encounter  Procedures  . CT CORONARY MORPH W/CTA COR W/SCORE W/CA W/CM &/OR WO/CM  . CT CORONARY FRACTIONAL FLOW RESERVE DATA PREP  . CT CORONARY FRACTIONAL FLOW RESERVE FLUID ANALYSIS  . Basic metabolic panel  . EKG 12-Lead   No orders of the defined types were placed in this encounter.   Patient Instructions  Medication  Instructions:  Your provider recommends that you continue on your current medications as directed. Please refer to the Current Medication list given to you today.    Labwork: You will have blood work drawn once your CT is scheduled.  Testing/Procedures: Dr. Excell Seltzerooper recommends you have a CARDIAC CT. You will be called to arrange this after we speak with your insurance.  Follow-Up: Your provider recommends that you schedule a follow-up appointment AS NEEDED pending study results.  Any Other Special Instructions Will Be Listed Below (If Applicable).  CARDIAC CT INSTRUCTIONS:  Please [facility] of [facility] at xx:xx AM (30-45 minutes prior to test start time)  [facility] [address] [place], [place] [address] [phone]  Proceed to the [facility] Radiology Department (First Floor).  Hold all erectile dysfunction medications at least 48 hours prior to test.  On the Night Before the Test: . Be sure to Drink plenty of water. . Do not consume any caffeinated/decaffeinated beverages or chocolate 12 hours prior to your test. . Do not take any antihistamines 12 hours prior to your test.   On the Day of the Test: . Drink plenty of water. Do not drink any water within one hour of the test. . Do not eat any food 4 hours prior to the test. .  You may take your regular medications prior to the test.  . Take metoprolol (Lopressor) two hours prior to test.  . HOLD Hydrochlorothiazide morning of the test.       After the Test: . Drink plenty of water. . After receiving IV contrast, you may experience a mild flushed feeling. This is normal. . On occasion, you may experience a mild rash up to 24 hours after the test. This is not dangerous. If this occurs, you can take Benadryl 25 mg and increase your fluid intake. . If you experience trouble breathing, this can be serious. If it is severe call 911 IMMEDIATELY. If it is mild, please call our office.    Signed, Tonny BollmanMichael Avagail Whittlesey, MD  06/07/2019 9:29 PM    [facility]

## 2019-06-07 NOTE — Patient Instructions (Addendum)
Medication Instructions:  Your provider recommends that you continue on your current medications as directed. Please refer to the Current Medication list given to you today.    Labwork: You will have blood work drawn once your CT is scheduled.  Testing/Procedures: Dr. Burt Knack recommends you have a CARDIAC CT. You will be called to arrange this after we speak with your insurance.  Follow-Up: Your provider recommends that you schedule a follow-up appointment AS NEEDED pending study results.  Any Other Special Instructions Will Be Listed Below (If Applicable).  CARDIAC CT INSTRUCTIONS:  Please arrive at the [facility] main entrance of [facility] at xx:xx AM (30-45 minutes prior to test start time)  [facility] [place], [place] [address] [phone]  Proceed to the [facility] Radiology Department (First Floor).  Hold all erectile dysfunction medications at least 48 hours prior to test.  On the Night Before the Test: . Be sure to Drink plenty of water. . Do not consume any caffeinated/decaffeinated beverages or chocolate 12 hours prior to your test. . Do not take any antihistamines 12 hours prior to your test.   On the Day of the Test: . Drink plenty of water. Do not drink any water within one hour of the test. . Do not eat any food 4 hours prior to the test. . You may take your regular medications prior to the test.  . Take metoprolol (Lopressor) two hours prior to test.  . HOLD Hydrochlorothiazide morning of the test.       After the Test: . Drink plenty of water. . After receiving IV contrast, you may experience a mild flushed feeling. This is normal. . On occasion, you may experience a mild rash up to 24 hours after the test. This is not dangerous. If this occurs, you can take Benadryl 25 mg and increase your fluid intake. . If you experience trouble breathing, this can be serious. If it is severe call 911 IMMEDIATELY. If it is mild,  please call our office.

## 2019-06-11 ENCOUNTER — Telehealth (HOSPITAL_COMMUNITY): Payer: Self-pay | Admitting: Emergency Medicine

## 2019-06-11 NOTE — Telephone Encounter (Signed)
Left message on voicemail with name and callback number Arabela Basaldua RN Navigator Cardiac Imaging [facility] [phone] Office [phone] Cell  

## 2019-06-12 ENCOUNTER — Ambulatory Visit (HOSPITAL_COMMUNITY)
Admission: RE | Admit: 2019-06-12 | Discharge: 2019-06-12 | Disposition: A | Discharge status: Home / Self Care | Payer: Medicare Other | Source: Ambulatory Visit | Attending: Cardiovascular Disease | Admitting: Cardiovascular Disease

## 2019-06-12 ENCOUNTER — Other Ambulatory Visit: Payer: Self-pay

## 2019-06-12 DIAGNOSIS — I2510 Atherosclerotic heart disease of native coronary artery without angina pectoris: Secondary | ICD-10-CM

## 2019-06-12 DIAGNOSIS — E785 Hyperlipidemia, unspecified: Secondary | ICD-10-CM

## 2019-06-12 DIAGNOSIS — R0602 Shortness of breath: Secondary | ICD-10-CM | POA: Diagnosis present

## 2019-06-12 DIAGNOSIS — I2584 Coronary atherosclerosis due to calcified coronary lesion: Secondary | ICD-10-CM | POA: Insufficient documentation

## 2019-06-12 LAB — POCT I-STAT CREATININE: Creatinine, Ser: 1 mg/dL (ref 0.61–1.24)

## 2019-06-12 MED ORDER — IOHEXOL 350 MG/ML SOLN
80.0000 mL | Freq: Once | INTRAVENOUS | Status: AC | PRN
  Administered 2019-06-12: 80 mL via INTRAVENOUS

## 2019-06-12 MED ORDER — NITROGLYCERIN 0.4 MG SL SUBL
SUBLINGUAL_TABLET | SUBLINGUAL | Status: AC
  Administered 2019-06-12: 0.8 mg via SUBLINGUAL
  Filled 2019-06-12: qty 2

## 2019-06-12 MED ORDER — NITROGLYCERIN 0.4 MG SL SUBL
0.8000 mg | SUBLINGUAL_TABLET | SUBLINGUAL | Status: DC | PRN
  Administered 2019-06-12: 0.8 mg via SUBLINGUAL

## 2019-06-13 DIAGNOSIS — I2510 Atherosclerotic heart disease of native coronary artery without angina pectoris: Secondary | ICD-10-CM | POA: Diagnosis not present

## 2019-11-21 ENCOUNTER — Ambulatory Visit (INDEPENDENT_AMBULATORY_CARE_PROVIDER_SITE_OTHER): Payer: Medicare Other

## 2019-11-21 ENCOUNTER — Encounter: Payer: Self-pay | Admitting: Podiatry

## 2019-11-21 ENCOUNTER — Other Ambulatory Visit: Payer: Self-pay

## 2019-11-21 ENCOUNTER — Ambulatory Visit (INDEPENDENT_AMBULATORY_CARE_PROVIDER_SITE_OTHER): Payer: Medicare Other | Admitting: Podiatry

## 2019-11-21 VITALS — BP 134/85 | HR 72 | Temp 97.5°F

## 2019-11-21 DIAGNOSIS — M79671 Pain in right foot: Secondary | ICD-10-CM

## 2019-11-21 DIAGNOSIS — L600 Ingrowing nail: Secondary | ICD-10-CM

## 2019-11-21 DIAGNOSIS — M722 Plantar fascial fibromatosis: Secondary | ICD-10-CM

## 2019-11-21 DIAGNOSIS — M79672 Pain in left foot: Secondary | ICD-10-CM

## 2019-11-21 NOTE — Progress Notes (Signed)
Subjective:   Patient ID: Fred Russell, male   DOB: 69 y.o.   MRN: [id number]   HPI Patient presents stating he has had a lot of shooting in his right big toe and is not sure if it is the nail or could be from his back and has had heel pain for the last month in both heels.  He does try to be active with different types of activities and does not smoke   Review of Systems  All other systems reviewed and are negative.       Objective:  Physical Exam Vitals and nursing note reviewed.  Constitutional:      Appearance: He is well-developed.  Pulmonary:     Effort: Pulmonary effort is normal.  Musculoskeletal:        General: Normal range of motion.  Skin:    General: Skin is warm.  Neurological:     Mental Status: He is alert.     Neurovascular status intact muscle strength adequate range of motion within normal limits work well structured first metatarsal bilateral with patient having had history of surgery.  Patient has discomfort in the plantar heel region bilateral with inflammation fluid of the medial band and did have a stroke with foot drop.  Patient has good digital perfusion well oriented x3     Assessment:  Plantar fasciitis bilateral possibility for ingrown toenail right hallux     Plan:  H&P reviewed both conditions and today I do not recommend treatment for the ingrown but if it gets worse we will need to be removed.  I did do sterile prep and injected the plantar fascia bilateral 3 mg Kenalog 5 mg Xylocaine advised on supportive shoes and reappoint to recheck  X-rays indicate osteotomies are in good position good alignment noted no indications of pathology from that standpoint

## 2019-11-21 NOTE — Patient Instructions (Signed)

## 2019-11-29 ENCOUNTER — Other Ambulatory Visit: Payer: Self-pay

## 2019-11-29 ENCOUNTER — Ambulatory Visit: Payer: Medicare Other | Attending: Family

## 2019-11-29 DIAGNOSIS — Z20822 Contact with and (suspected) exposure to covid-19: Secondary | ICD-10-CM

## 2019-11-30 LAB — NOVEL CORONAVIRUS, NAA: SARS-CoV-2, NAA: NOT DETECTED

## 2019-12-27 ENCOUNTER — Other Ambulatory Visit: Payer: Self-pay | Admitting: Podiatry

## 2019-12-27 DIAGNOSIS — M722 Plantar fascial fibromatosis: Secondary | ICD-10-CM

## 2020-05-16 ENCOUNTER — Telehealth: Payer: Self-pay | Admitting: Cardiovascular Disease

## 2020-05-16 ENCOUNTER — Telehealth: Payer: Self-pay | Admitting: *Deleted

## 2020-05-16 NOTE — Telephone Encounter (Signed)
Primary Cardiologist:Michael Excell Seltzer, MD  Chart reviewed as part of pre-operative protocol coverage. Because of Fred Russell's past medical history and time since last visit, he/she will require a follow-up visit in order to better assess preoperative cardiovascular risk.  Pre-op covering staff: - Please schedule appointment and call patient to inform them. - Please contact requesting surgeon's office via preferred method (i.e, phone, fax) to inform them of need for appointment prior to surgery.  If applicable, this message will also be routed to pharmacy pool and/or primary cardiologist for input on holding anticoagulant/antiplatelet agent as requested below so that this information is available at time of patient's appointment.   Ronney Asters, NP  05/16/2020, 1:36 PM

## 2020-05-16 NOTE — Telephone Encounter (Signed)
Pt has appt for cardiac clearance 05/29/2020@3 :20 PM Tonny Bollman, MD. Will determine cardiac clearance at that time.

## 2020-05-16 NOTE — Telephone Encounter (Signed)
   [facility] Pre-operative Risk Assessment    HEARTCARE STAFF: - Please ensure there is not already an duplicate clearance open for this procedure. - Under Visit Info/Reason for Call, type in Other and utilize the format Clearance MM/DD/YY or Clearance TBD. Do not use dashes or single digits. - If request is for dental extraction, please clarify the # of teeth to be extracted.  Request for surgical clearance:  1. What type of surgery is being performed? RIGHT SHOULDER SCOPE ROTATOR CUFF REPAIR   2. When is this surgery scheduled? TBD   3. What type of clearance is required (medical clearance vs. Pharmacy clearance to hold med vs. Both)? MEDICAL  4. Are there any medications that need to be held prior to surgery and how long? ASA    5. Practice name and name of physician performing surgery? [facility]; DR. DAX VARKEY   6. What is the office phone number? [phone]    7.   What is the office fax number? [place].   Anesthesia type (None, local, MAC, general) ? CHOICE   Julaine Hua 05/16/2020, 12:19 PM  _________________________________________________________________   (provider comments below)

## 2020-05-16 NOTE — Telephone Encounter (Signed)
The patient states he had an appointment at [facility] yesterday and will require rotator cuff repair surgery and they will be sending over a clearance request. Since he has not been seen in almost a year, scheduled him with Dr. Excell Seltzer 7/15 in case in-office evaluation is necessary.  Once clearance is received, appointment will be cancelled if not needed. The patient was grateful for call and agrees with plan.

## 2020-05-16 NOTE — Telephone Encounter (Signed)
New message   Patient would like a call to discuss rotator cuff repair surgery. The office that is setting this up for him has faxed in the clearance but he states that he would still like a phone call to discuss.

## 2020-05-16 NOTE — Telephone Encounter (Signed)
Faxed to requesting providers office via Epic fax function 

## 2020-05-26 ENCOUNTER — Telehealth: Payer: Self-pay | Admitting: Cardiovascular Disease

## 2020-05-26 NOTE — Telephone Encounter (Signed)
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 On one finger, his AppleWatch said he had afib. On another digit, it did not (at the same time). He has no symptoms (SOB, CP) but could tell his heart rate was irregular.  He feels he is back in NSR today, but would like an EKG prior to his appointment Thursday for Dr. Excell Seltzer to review. Scheduled the patient for EKG tomorrow at 1100.  He will call the on-call tonight if symptoms occur or if he feels his heart is racing. He was grateful for assistance.

## 2020-05-26 NOTE — Telephone Encounter (Signed)
Pt mentionned that he is having afib symptoms. Wants nurse to call to discuss

## 2020-05-27 ENCOUNTER — Other Ambulatory Visit: Payer: Self-pay

## 2020-05-27 ENCOUNTER — Ambulatory Visit (INDEPENDENT_AMBULATORY_CARE_PROVIDER_SITE_OTHER): Payer: Medicare Other

## 2020-05-27 VITALS — HR 98 | Ht 74.0 in

## 2020-05-27 DIAGNOSIS — I480 Paroxysmal atrial fibrillation: Secondary | ICD-10-CM | POA: Diagnosis not present

## 2020-05-27 NOTE — Progress Notes (Signed)
1.) Reason for visit: EKG - irregular heart rate per patient, AppleWatch showing Afib  2.) Name of MD requesting visit: Dr. Excell Seltzer  3.) Assessment and plan per MD: EKG reviewed by DOD, Dr. Elberta Fortis, does not show A Fib. Patient will keep FU as scheduled with Dr. Excell Seltzer 07/15.

## 2020-05-29 ENCOUNTER — Telehealth: Payer: Self-pay | Admitting: Radiology

## 2020-05-29 ENCOUNTER — Ambulatory Visit (INDEPENDENT_AMBULATORY_CARE_PROVIDER_SITE_OTHER): Payer: Medicare Other | Admitting: Cardiovascular Disease

## 2020-05-29 ENCOUNTER — Encounter: Payer: Self-pay | Admitting: Cardiovascular Disease

## 2020-05-29 ENCOUNTER — Other Ambulatory Visit: Payer: Self-pay

## 2020-05-29 VITALS — BP 126/84 | HR 66 | Ht 74.0 in | Wt 188.2 lb

## 2020-05-29 DIAGNOSIS — I480 Paroxysmal atrial fibrillation: Secondary | ICD-10-CM

## 2020-05-29 DIAGNOSIS — Q211 Atrial septal defect: Secondary | ICD-10-CM

## 2020-05-29 DIAGNOSIS — R002 Palpitations: Secondary | ICD-10-CM | POA: Diagnosis not present

## 2020-05-29 DIAGNOSIS — I10 Essential (primary) hypertension: Secondary | ICD-10-CM | POA: Diagnosis not present

## 2020-05-29 DIAGNOSIS — I2510 Atherosclerotic heart disease of native coronary artery without angina pectoris: Secondary | ICD-10-CM

## 2020-05-29 DIAGNOSIS — Q2112 Patent foramen ovale: Secondary | ICD-10-CM

## 2020-05-29 DIAGNOSIS — E782 Mixed hyperlipidemia: Secondary | ICD-10-CM

## 2020-05-29 MED ORDER — METOPROLOL SUCCINATE ER 25 MG PO TB24: 25.0000 mg | ORAL_TABLET | Freq: Every day | ORAL | 3 refills | Status: DC

## 2020-05-29 NOTE — Progress Notes (Signed)
Cardiology Office Note:    Date:  05/29/2020   ID:  Fred Russell, DOB 01-17-1951, MRN [id number]  PCP:  Fred Russell, Steve, MD  [facility] Cardiologist:  Fred BollmanMichael Kimberleigh Mehan, MD  [facility] Electrophysiologist:  None   Referring MD: Fred Russell, Steve, MD   Chief Complaint  Patient presents with  . Hypertension    History of Present Illness:    Fred Russell is a 69 y.o. male with a hx of cryptogenic stroke, PFO status post transcatheter closure in 2016, paroxysmal atrial fibrillation occurring after PFO closure and detected on a loop recorder, and aortic root dilatation.  The patient presents today for follow-up evaluation. He initially scheduled this visit for cardiac clearance for rotator cuff repair. However, last week he began to feel palpitations. He checked his apple watch and there were several reports of atrial fibrillation.  He became more concerned and notify the office.  He was brought in for an electrocardiogram which demonstrated grouped PACs but his underlying rhythm was clearly normal sinus.  He presents today for follow-up and is actually feeling better today.  He has had less frequent heart palpitations.  He has had a little bit of associated lightheadedness with this.  He has not had chest pain or pressure, dyspnea, edema, orthopnea, PND, or syncope.  Past Medical History:  Diagnosis Date  . Arthritis    frozen shoulder  . Cryptogenic stroke (HCC)    a. 10/2015-->TEE showed PFO w/ R->L Shunt, s/p closure;  b. s/p MDT Linq.  . Essential hypertension   . Left foot drop 08/13/2015  . PFO (patent foramen ovale)    a. 10/2015 s/p closure w/ 25 mm Amplatzer PFO occluder.    Past Surgical History:  Procedure Laterality Date  . CARDIAC CATHETERIZATION N/A 11/14/2015   Procedure: PFO Closure;  Surgeon: Fred BollmanMichael Beverlee Wilmarth, MD;  [facility];  Service: Cardiovascular;  Laterality: N/A;  . EP IMPLANTABLE DEVICE N/A 10/28/2015   Procedure: Loop Recorder  Insertion;  Surgeon: Hillis RangeJames Allred, MD;  Location: [facility];  Service: Cardiovascular;  Laterality: N/A;  . Implantable loop recorder removal  10/02/2018   MDT Linq removed by Dr Johney FrameAllred in office  . INGUINAL HERNIA REPAIR Right 09/25/2018   Procedure: OPEN RIGHT INGUINAL HERNIA REPAIR WITH INSERTION OF MESH;  Surgeon: Jimmye NormanWyatt, James, MD;  Location: [facility];  Service: General;  Laterality: Right;  . ROTATOR CUFF REPAIR     x3  . TEE WITHOUT CARDIOVERSION N/A 10/28/2015   Procedure: TRANSESOPHAGEAL ECHOCARDIOGRAM (TEE);  Surgeon: Laurey Moralealton S McLean, MD;  [facility];  Service: Cardiovascular;  Laterality: N/A;    Current Medications: Current Meds  Medication Sig  . aspirin EC 81 MG tablet Take 81 mg by mouth daily. Swallow whole.  [name]. atorvastatin (LIPITOR) 40 MG tablet TAKE 1 TABLET BY MOUTH EACH DAY FOR HIGH CHOLESTEROL.  . hydrochlorothiazide (HYDRODIURIL) 25 MG tablet Take 25 mg by mouth daily.  [name]. lisinopril (PRINIVIL,ZESTRIL) 20 MG tablet Take 20 mg 2 (two) times daily by mouth.  . [DISCONTINUED] amLODipine (NORVASC) 5 MG tablet Take 5 mg by mouth daily.     Allergies:   Patient has no known allergies.   Social History   Socioeconomic History  . Marital status: Married    Spouse name: Not on file  . Number of children: 2  . Years of education: DDS  . Highest education level: Not on file  Occupational History  . Occupation: retired  Tobacco Use  .  Smoking status: Never Smoker  . Smokeless tobacco: Never Used  Substance and Sexual Activity  . Alcohol use: No  . Drug use: No  . Sexual activity: Not on file  Other Topics Concern  . Not on file  Social History Narrative   Patient drinks 2 cups of coffee daily.   Patient is right handed.    Social Determinants of Health   Financial Resource Strain:   . Difficulty of Paying Living Expenses:   Food Insecurity:   . Worried About Programme researcher, broadcasting/film/video in the Last Year:   . Barista in  the Last Year:   Transportation Needs:   . Freight forwarder (Medical):   [name] Lack of Transportation (Non-Medical):   Physical Activity:   . Days of Exercise per Week:   . Minutes of Exercise per Session:   Stress:   . Feeling of Stress :   Social Connections:   . Frequency of Communication with Friends and Family:   . Frequency of Social Gatherings with Friends and Family:   . Attends Religious Services:   . Active Member of Clubs or Organizations:   . Attends Banker Meetings:   [name] Marital Status:      Family History: The patient's family history includes ALS in his father; COPD in his mother; Heart failure in his mother; Skin cancer in his mother.  ROS:   Please see the history of present illness.    All other systems reviewed and are negative.  EKGs/Labs/Other Studies Reviewed:    CT-FFR 06/12/2019: FINDINGS: FFR 0.81 mid LAD  FFR 0.86 mid D1  IMPRESSION: Proximal/mid LAD stenosis does not appear to be hemodynamically Significant.  Cardiac CT 06/12/2019: FINDINGS: Non-cardiac: See separate report from [facility].  There is an atrial septal closure device present (Amplatzer PFO occluder), no evidence for residual interatrial shunting noted. Normal left-sided aortic arch with descending thoracic aorta crossing more rightward than usual to just past the mid-line. The right lower pulmonary vein is in close proximity to the descending thoracic aorta and is stenosed where it joins the left atrium (the right lower pulmonary vein is slit-like when it joins the left atrium). It is not in close proximity to the Amplatzer device. The other pulmonary veins drain normally to the left atrium. Mildly dilated aortic root at sinuses of valsalva (4.3 cm) and ascending thoracic aorta (3.8 cm).  Calcium Score: 374 Agatston units.  Coronary Arteries: Right dominant with no anomalies  LM: No plaque or stenosis.  LAD system: Extensive mixed plaque  in the proximal LAD at D1. Suspect moderate (51-69%) stenosis.  Circumflex system: No plaque or stenosis.  RCA system: No plaque or stenosis.  IMPRESSION: 1. 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 It is in close proximity to the descending thoracic aorta.  2.  Intact Amplatzer PFO closure device.  3. Possible moderate proximal LAD stenosis. Will send for FFR to assess for hemodynamic significance.  4.  Mildly dilated aortic root.  5. Coronary artery calcium score 374 Agatston units. This places the patient in the 72nd percentile for age and gender, suggesting intermediate risk for future cardiac events.  EKG:  EKG is ordered today.  The ekg ordered today demonstrates normal sinus rhythm 66 bpm, low voltage limb leads, otherwise normal  Recent Labs: 06/12/2019: Creatinine, Ser 1.00  Recent Lipid Panel    Component Value Date/Time   CHOL 206 (H) 10/27/2015 4665  TRIG 119 10/27/2015 0317   HDL 40 (L) 10/27/2015 0317   CHOLHDL 5.2 10/27/2015 0317   VLDL 24 10/27/2015 0317   LDLCALC 142 (H) 10/27/2015 0317    Physical Exam:    VS:  BP 126/84   Pulse 66   Ht  (1.88 m)   Wt 188 lb 3.2 oz (85.4 kg)   SpO2 95%   BMI 24.16 kg/m     Wt Readings from Last 3 Encounters:  05/29/20 188 lb 3.2 oz (85.4 kg)  06/07/19 199 lb (90.3 kg)  10/02/18 191 lb 3.2 oz (86.7 kg)     GEN:  Well nourished, well developed in no acute distress HEENT: Normal NECK: No JVD; No carotid bruits LYMPHATICS: No lymphadenopathy CARDIAC: RRR, no murmurs, rubs, gallops RESPIRATORY:  Clear to auscultation without rales, wheezing or rhonchi  ABDOMEN: Soft, non-tender, non-distended MUSCULOSKELETAL:  No edema; No deformity  SKIN: Warm and dry NEUROLOGIC:  Alert and oriented x 3 PSYCHIATRIC:  Normal affect   ASSESSMENT:    1. Palpitations   2. Paroxysmal atrial fibrillation (HCC)   3. Coronary artery disease involving native  coronary artery of native heart without angina pectoris   4. Essential hypertension   5. PFO (patent foramen ovale)   6. Mixed hyperlipidemia    PLAN:    In order of problems listed above:  1. I personally reviewed the patient's transmissions from his apple watch.  There is significant R-R irregularity but the baseline artifact makes it difficult to determine whether there are grouped PACs or atrial fibrillation.  His EKG performed 2 days ago shows sinus rhythm with frequent and consecutive PACs.  I will check an echocardiogram.  I recommended that he start metoprolol succinate 25 mg daily and stop amlodipine. 2. No recent recurrence has been documented.  We will check a 14-day event monitor to further evaluate his arrhythmia.  Continue aspirin for now.  Start oral anticoagulant drug if atrial fibrillation is confirmed. 3. Stable with no symptoms of angina.  Continue aspirin for antiplatelet therapy and a high intensity statin drug.  Coronary CT results reviewed from last year. 4. Blood pressure controlled, continue hydrochlorothiazide and lisinopril.  Stop amlodipine, start metoprolol succinate. 5. Intact closure device, no recurrent stroke/TIA 6. Treated with atorvastatin 40 mg daily.    Medication Adjustments/Labs and Tests Ordered: Current medicines are reviewed at length with the patient today.  Concerns regarding medicines are outlined above.  Orders Placed This Encounter  Procedures  . LONG TERM MONITOR (3-14 DAYS)  . EKG 12-Lead  . ECHOCARDIOGRAM COMPLETE   Meds ordered this encounter  Medications  . metoprolol succinate (TOPROL XL) 25 MG 24 hr tablet    Sig: Take 1 tablet (25 mg total) by mouth daily.    Dispense:  90 tablet    Refill:  3    Patient Instructions  Medication Instructions:  1) STOP AMLODIPINE 2) START TOPROL 25 mg daily *If you need a refill on your cardiac medications before your next appointment, please call your pharmacy*  Testing/Procedures: Your  provider has requested that you have an echocardiogram. Echocardiography is a painless test that uses sound waves to create images of your heart. It provides your doctor with information about the size and shape of your heart and how well your heart's chambers and valves are working. This procedure takes approximately one hour. There are no restrictions for this procedure.    Dr. Excell Seltzer recommends you wear a monitor for 2 weeks.  Follow-Up: At  [facility], you and your health needs are our priority.  As part of our continuing mission to provide you with exceptional heart care, we have created designated Provider Care Teams.  These Care Teams include your primary Cardiologist (physician) and Advanced Practice Providers (APPs -  Physician Assistants and Nurse Practitioners) who all work together to provide you with the care you need, when you need it. Your next appointment:   6 month(s) The format for your next appointment:   In Person Provider:   You may see Fred Bollman, MD or one of the following Advanced Practice Providers on your designated Care Team:    Tereso Newcomer, PA-C  Vin Bhagat, PA-C    ZIO XT- Long Term Monitor Instructions   Your physician has requested you wear your ZIO patch monitor 14 days.   This is a single patch monitor.  [employer] supplies one patch monitor per enrollment.  Additional stickers are not available.   Please do not apply patch if you will be having a Nuclear Stress Test, Echocardiogram, Cardiac CT, MRI, or Chest Xray during the time frame you would be wearing the monitor. The patch cannot be worn during these tests.  You cannot remove and re-apply the ZIO XT patch monitor.   Your ZIO patch monitor will be sent USPS Priority mail from [facility] directly to your home address. The monitor may also be mailed to a PO BOX if home delivery is not available.   It may take 3-5 days to receive your monitor after you have been enrolled.   Once you have  received you monitor, please review enclosed instructions.  Your monitor has already been registered assigning a specific monitor serial # to you.   Applying the monitor   Shave hair from upper left chest.   Hold abrader disc by orange tab.  Rub abrader in 40 strokes over left upper chest as indicated in your monitor instructions.   Clean area with 4 enclosed alcohol pads .  Use all pads to assure are is cleaned thoroughly.  Let dry.   Apply patch as indicated in monitor instructions.  Patch will be place under collarbone on left side of chest with arrow pointing upward.   Rub patch adhesive wings for 2 minutes.Remove white label marked "1".  Remove white label marked "2".  Rub patch adhesive wings for 2 additional minutes.   While looking in a mirror, press and release button in center of patch.  A small green light will flash 3-4 times .  This will be your only indicator the monitor has been turned on.     Do not shower for the first 24 hours.  You may shower after the first 24 hours.   Press button if you feel a symptom. You will hear a small click.  Record Date, Time and Symptom in the Patient Log Book.   When you are ready to remove patch, follow instructions on last 2 pages of Patient Log Book.  Stick patch monitor onto last page of Patient Log Book.   Place Patient Log Book in [place] box.  Use locking tab on box and tape box closed securely.  The Orange and [employer] has [employer] on it.  Please place in mailbox as soon as possible.  Your physician should have your test results approximately 7 days after the monitor has been mailed back to [facility].   Call [facility] Customer Care at [phone] if you have questions regarding your ZIO XT patch  monitor.  Call them immediately if you see an orange light blinking on your monitor.   If your monitor falls off in less than 4 days contact our Monitor department at [phone].  If your monitor becomes loose or falls off  after 4 days call [employer] at [phone] for suggestions on securing your monitor.        Signed, Fred Bollman, MD  05/29/2020 4:06 PM    [facility]

## 2020-05-29 NOTE — Patient Instructions (Signed)
Medication Instructions:  1) STOP AMLODIPINE 2) START TOPROL 25 mg daily *If you need a refill on your cardiac medications before your next appointment, please call your pharmacy*  Testing/Procedures: Your provider has requested that you have an echocardiogram. Echocardiography is a painless test that uses sound waves to create images of your heart. It provides your doctor with information about the size and shape of your heart and how well your heart's chambers and valves are working. This procedure takes approximately one hour. There are no restrictions for this procedure.    Dr. Excell Seltzer recommends you wear a monitor for 2 weeks.  Follow-Up: At [facility], you and your health needs are our priority.  As part of our continuing mission to provide you with exceptional heart care, we have created designated Provider Care Teams.  These Care Teams include your primary Cardiologist (physician) and Advanced Practice Providers (APPs -  Physician Assistants and Nurse Practitioners) who all work together to provide you with the care you need, when you need it. Your next appointment:   6 month(s) The format for your next appointment:   In Person Provider:   You may see Tonny Bollman, MD or one of the following Advanced Practice Providers on your designated Care Team:    Tereso Newcomer, PA-C  Vin Bhagat, PA-C    ZIO XT- Long Term Monitor Instructions   Your physician has requested you wear your ZIO patch monitor 14 days.   This is a single patch monitor.  [employer] supplies one patch monitor per enrollment.  Additional stickers are not available.   Please do not apply patch if you will be having a Nuclear Stress Test, Echocardiogram, Cardiac CT, MRI, or Chest Xray during the time frame you would be wearing the monitor. The patch cannot be worn during these tests.  You cannot remove and re-apply the ZIO XT patch monitor.   Your ZIO patch monitor will be sent USPS Priority mail from [facility] directly to your home address. The monitor may also be mailed to a PO BOX if home delivery is not available.   It may take 3-5 days to receive your monitor after you have been enrolled.   Once you have received you monitor, please review enclosed instructions.  Your monitor has already been registered assigning a specific monitor serial # to you.   Applying the monitor   Shave hair from upper left chest.   Hold abrader disc by orange tab.  Rub abrader in 40 strokes over left upper chest as indicated in your monitor instructions.   Clean area with 4 enclosed alcohol pads .  Use all pads to assure are is cleaned thoroughly.  Let dry.   Apply patch as indicated in monitor instructions.  Patch will be place under collarbone on left side of chest with arrow pointing upward.   Rub patch adhesive wings for 2 minutes.Remove white label marked "1".  Remove white label marked "2".  Rub patch adhesive wings for 2 additional minutes.   While looking in a mirror, press and release button in center of patch.  A small green light will flash 3-4 times .  This will be your only indicator the monitor has been turned on.     Do not shower for the first 24 hours.  You may shower after the first 24 hours.   Press button if you feel a symptom. You will hear a small click.  Record Date, Time and Symptom in the Patient Log Book.  When you are ready to remove patch, follow instructions on last 2 pages of Patient Log Book.  Stick patch monitor onto last page of Patient Log Book.   Place Patient Log Book in [place] box.  Use locking tab on box and tape box closed securely.  The [employer] and [employer] has [employer] on it.  Please place in mailbox as soon as possible.  Your physician should have your test results approximately 7 days after the monitor has been mailed back to [facility].   Call [facility] Customer Care at [phone] if you have questions regarding your ZIO XT patch monitor.   Call them immediately if you see an orange light blinking on your monitor.   If your monitor falls off in less than 4 days contact our Monitor department at [phone].  If your monitor becomes loose or falls off after 4 days call [employer] at [phone] for suggestions on securing your monitor.

## 2020-05-29 NOTE — Telephone Encounter (Signed)
Enrolled patient for a 14 day Zio monitor to be mailed to patients home.  

## 2020-06-03 ENCOUNTER — Other Ambulatory Visit (INDEPENDENT_AMBULATORY_CARE_PROVIDER_SITE_OTHER): Payer: Medicare Other

## 2020-06-03 DIAGNOSIS — I480 Paroxysmal atrial fibrillation: Secondary | ICD-10-CM | POA: Diagnosis not present

## 2020-06-03 DIAGNOSIS — R002 Palpitations: Secondary | ICD-10-CM

## 2020-06-16 ENCOUNTER — Other Ambulatory Visit: Payer: Self-pay

## 2020-06-16 ENCOUNTER — Ambulatory Visit (HOSPITAL_COMMUNITY): Payer: Medicare Other | Attending: Cardiology

## 2020-06-16 DIAGNOSIS — I480 Paroxysmal atrial fibrillation: Secondary | ICD-10-CM | POA: Insufficient documentation

## 2020-06-16 DIAGNOSIS — R002 Palpitations: Secondary | ICD-10-CM | POA: Diagnosis present

## 2020-06-16 LAB — ECHOCARDIOGRAM COMPLETE
Area-P 1/2: 1.69 cm2
P 1/2 time: 1113 msec
S' Lateral: 3.7 cm

## 2020-07-07 ENCOUNTER — Telehealth: Payer: Self-pay | Admitting: Cardiovascular Disease

## 2020-07-07 DIAGNOSIS — I471 Supraventricular tachycardia: Secondary | ICD-10-CM

## 2020-07-07 DIAGNOSIS — I493 Ventricular premature depolarization: Secondary | ICD-10-CM

## 2020-07-07 NOTE — Telephone Encounter (Signed)
Spoke to the patient about results. Per Dr. Excell Seltzer, no afib was on monitor but there were atrial and ventricular runs. EP referral placed for scheduling so the patient can discuss results and need for medications.

## 2020-07-07 NOTE — Telephone Encounter (Signed)
Patient was calling to check on the status of his monitor results. He is trying to schedule shoulder surgery and needs to know the results before he can schedule the surgery

## 2020-07-15 NOTE — Telephone Encounter (Signed)
   Primary Cardiologist: Tonny Bollman, MD  Chart reviewed as part of pre-operative protocol coverage, coming on service today.   Chart was forwarded to the staff messages of the pre-op pool with following message from 8/11.  ----- Message -----  From: Bonney Leitz  Sent: 06/25/2020  7:52 AM EDT  To: Elliot Cousin, RMA, Alyson Ingles, LPN  Subject: SURGERY CLEARANCE                 Your fax states patient had an appointment 05/29/20 @ 3:20 pm with Dr. Excell Seltzer for clearance. The patient wore a monitor for 2 weeks and had an ECHO 06/16/20. There is no documentation indicating the patient is cleared for surgery. Please advise.   Thank you  Tresa Endo High      As below, pre-op pool previously determined patient would need an appointment with Dr. Excell Seltzer before clearing for surgery. He had an appointment on 05/29/20 and echo/monitor were performed. Per Dr. Excell Seltzer, echo showed normal LV function, intact atrial septal occluder, no significant valvular disease, and mild aortic dilatation unchanged from prior study. Monitor showed NSR, PVCs, multiple short runs of SVT and NSVT. EP consultation has been recommended and is pending for tomorrow.  Do not see final commentary yet about clearance for surgery so will route to Dr. Excell Seltzer for his input. Original clearance also requests input on holding ASA for procedure (h/o cryptogenic stroke and PFO closure, no prior PCI). Dr. Excell Seltzer - Please route response to P CV DIV PREOP (the pre-op pool). Thank you.  Laurann Montana, PA-C 07/15/2020, 12:53 PM

## 2020-07-15 NOTE — Telephone Encounter (Signed)
   Primary Cardiologist: Tonny Bollman, MD  Chart revisited as part of pre-operative protocol coverage. Reviewed surgical clearance with Dr. Excell Seltzer based on cardiac workup. Dr. Excell Seltzer feels he is at low cardiac risk of surgery and is OK to hold ASA x 7 days preoperatively if necessary.  I called the patient to let him know. The patient was advised that if he develops new symptoms prior to surgery to contact our office to arrange for a follow-up visit, and he verbalized understanding.  I will route this recommendation to the requesting party via Epic fax function and remove from pre-op pool. Please call with questions.  Laurann Montana, PA-C 07/15/2020, 2:53 PM

## 2020-07-15 NOTE — Telephone Encounter (Signed)
At low cardiac risk of surgery. Ok to hold ASA x 7 days preoperatively if necessary.

## 2020-07-16 ENCOUNTER — Encounter: Payer: Self-pay | Admitting: Cardiology

## 2020-07-16 ENCOUNTER — Ambulatory Visit (INDEPENDENT_AMBULATORY_CARE_PROVIDER_SITE_OTHER): Payer: Medicare Other | Admitting: Cardiology

## 2020-07-16 ENCOUNTER — Other Ambulatory Visit: Payer: Self-pay

## 2020-07-16 VITALS — BP 152/86 | HR 53 | Ht 74.0 in | Wt 189.4 lb

## 2020-07-16 DIAGNOSIS — I471 Supraventricular tachycardia: Secondary | ICD-10-CM

## 2020-07-16 NOTE — Progress Notes (Signed)
Electrophysiology Office Note:    Date:  07/16/2020   ID:  Fred Russell, DOB November 27, 1950, MRN [id number]  PCP:  Gareth Morgan, MD  [facility] Cardiologist:  Tonny Bollman, MD  [facility] Electrophysiologist:  None   Referring MD: Tonny Bollman, MD   Chief Complaint: Palpitations  History of Present Illness:    Fred Russell is a 69 y.o. male with a hx of PFO status post closure in December 2016, cryptogenic stroke in 2016, hypertension who presents to the clinic for an evaluation of palpitations at the request of Dr. Excell Seltzer.  Patient has a apple watch which is recorded several episodes labeled as possible atrial fibrillation.  Previous EKG performed in the office shows sinus rhythm with frequent PACs  He was started on metoprolol by Dr. Excell Seltzer on May 29, 2020 and his amlodipine was stopped.      Past Medical History:  Diagnosis Date  . Arthritis    frozen shoulder  . Cryptogenic stroke (HCC)    a. 10/2015-->TEE showed PFO w/ R->L Shunt, s/p closure;  b. s/p MDT Linq.  . Essential hypertension   . Left foot drop 08/13/2015  . PFO (patent foramen ovale)    a. 10/2015 s/p closure w/ 25 mm Amplatzer PFO occluder.    Past Surgical History:  Procedure Laterality Date  . CARDIAC CATHETERIZATION N/A 11/14/2015   Procedure: PFO Closure;  Surgeon: Tonny Bollman, MD;  Location: [facility] INVASIVE CV LAB;  Service: Cardiovascular;  Laterality: N/A;  . EP IMPLANTABLE DEVICE N/A 10/28/2015   Procedure: Loop Recorder Insertion;  Surgeon: Hillis Range, MD;  Location: [facility] INVASIVE CV LAB;  Service: Cardiovascular;  Laterality: N/A;  . Implantable loop recorder removal  10/02/2018   MDT Linq removed by Dr Johney Frame in office  . INGUINAL HERNIA REPAIR Right 09/25/2018   Procedure: OPEN RIGHT INGUINAL HERNIA REPAIR WITH INSERTION OF MESH;  Surgeon: Jimmye Norman, MD;  Location:  SURGERY CENTER;  Service: General;  Laterality: Right;  . ROTATOR CUFF REPAIR     x3  . TEE WITHOUT  CARDIOVERSION N/A 10/28/2015   Procedure: TRANSESOPHAGEAL ECHOCARDIOGRAM (TEE);  Surgeon: Laurey Morale, MD;  Location: [facility] ENDOSCOPY;  Service: Cardiovascular;  Laterality: N/A;    Current Medications: Current Meds  Medication Sig  . aspirin EC 81 MG tablet Take 81 mg by mouth daily. Swallow whole.  [name] atorvastatin (LIPITOR) 40 MG tablet TAKE 1 TABLET BY MOUTH EACH DAY FOR HIGH CHOLESTEROL.  . hydrochlorothiazide (HYDRODIURIL) 25 MG tablet Take 25 mg by mouth daily.  [name] lisinopril (PRINIVIL,ZESTRIL) 20 MG tablet Take 20 mg 2 (two) times daily by mouth.  . metoprolol succinate (TOPROL XL) 25 MG 24 hr tablet Take 1 tablet (25 mg total) by mouth daily.     Allergies:   Patient has no known allergies.   Social History   Socioeconomic History  . Marital status: Married    Spouse name: Not on file  . Number of children: 2  . Years of education: DDS  . Highest education level: Not on file  Occupational History  . Occupation: retired  Tobacco Use  . Smoking status: Never Smoker  . Smokeless tobacco: Never Used  Substance and Sexual Activity  . Alcohol use: No  . Drug use: No  . Sexual activity: Not on file  Other Topics Concern  . Not on file  Social History Narrative   Patient drinks 2 cups of coffee daily.   Patient is right handed.    Social  Determinants of Health   Financial Resource Strain:   . Difficulty of Paying Living Expenses: Not on file  Food Insecurity:   . Worried About Programme researcher, broadcasting/film/video in the Last Year: Not on file  . Ran Out of Food in the Last Year: Not on file  Transportation Needs:   . Lack of Transportation (Medical): Not on file  . Lack of Transportation (Non-Medical): Not on file  Physical Activity:   . Days of Exercise per Week: Not on file  . Minutes of Exercise per Session: Not on file  Stress:   . Feeling of Stress : Not on file  Social Connections:   . Frequency of Communication with Friends and Family: Not on file  . Frequency of Social  Gatherings with Friends and Family: Not on file  . Attends Religious Services: Not on file  . Active Member of Clubs or Organizations: Not on file  . Attends Banker Meetings: Not on file  . Marital Status: Not on file     Family History: The patient's family history includes ALS in his father; COPD in his mother; Heart failure in his mother; Skin cancer in his mother.  ROS:   Please see the history of present illness.    All other systems reviewed and are negative.  EKGs/Labs/Other Studies Reviewed:    The following studies were reviewed today: ZIO, echo, EKG  June 27, 2020 ZIO monitor personally reviewed by me demonstrates sinus rhythm with average heart rate of 56 bpm.  There is no evidence of atrial fibrillation or atrial flutter.  Low burden of PVCs.  June 16, 2020 echo 1. Left ventricular ejection fraction, by estimation, is 55%. The left  ventricle has normal function. The left ventricle has no regional wall  motion abnormalities. Left ventricular diastolic parameters are consistent  with Grade I diastolic dysfunction  (impaired relaxation).  2. Right ventricular systolic function is normal. The right ventricular  size is normal. There is normal pulmonary artery systolic pressure. The  estimated right ventricular systolic pressure is 29.9 mmHg.  3. Left atrial size was mildly dilated.  4. Right atrial size was moderately dilated.  5. Amplatzer PFO closure device is present, no significant shunting  noted.  6. The mitral valve is normal in structure. Trivial mitral valve  regurgitation. No evidence of mitral stenosis.  7. The aortic valve is tricuspid. Aortic valve regurgitation is mild. No  aortic stenosis is present.  8. Aortic dilatation noted. 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  9. The inferior vena cava is dilated in size with >50% respiratory  variability, suggesting right atrial pressure of 8 mmHg.   05/29/2020  ECG personally reviewed by me shows sinus rhythm with a borderline right bundle branch block pattern  EKG:  The ekg ordered today demonstrates sinus bradycardia with a ventricular to 52 bpm.  Recent Labs: No results found for requested labs within last 8760 hours.  Recent Lipid Panel    Component Value Date/Time   CHOL 206 (H) 10/27/2015 0317   TRIG 119 10/27/2015 0317   HDL 40 (L) 10/27/2015 0317   CHOLHDL 5.2 10/27/2015 0317   VLDL 24 10/27/2015 0317   LDLCALC 142 (H) 10/27/2015 0317    Physical Exam:    VS:  BP (!) 152/86   Pulse (!) 53   Ht 6\' 2"  (1.88 m)   Wt 189 lb 6.4 oz (85.9 kg)   SpO2 97%   BMI 24.32  kg/m     Wt Readings from Last 3 Encounters:  07/16/20 189 lb 6.4 oz (85.9 kg)  05/29/20 188 lb 3.2 oz (85.4 kg)  06/07/19 199 lb (90.3 kg)     GEN:  Well nourished, well developed in no acute distress HEENT: Normal NECK: No JVD; No carotid bruits LYMPHATICS: No lymphadenopathy CARDIAC: RRR, no murmurs, rubs, gallops RESPIRATORY:  Clear to auscultation without rales, wheezing or rhonchi  ABDOMEN: Soft, non-tender, non-distended MUSCULOSKELETAL:  No edema; No deformity  SKIN: Warm and dry NEUROLOGIC:  Alert and oriented x 3 PSYCHIATRIC:  Normal affect   ASSESSMENT:    1. Atrial tachycardia (HCC)    PLAN:    In order of problems listed above:  1. Atrial tachycardia Patient has a recent ZIO monitor demonstrating paroxysms of atrial tachycardia lasting just a few seconds.  The patient is aware of these episodes and sometimes checks his rhythm using his apple watch.  There is no sustained episodes and no evidence of atrial fibrillation.  Discussed the options for ongoing monitoring with the patient including implanting a loop recorder or continuing with symptom driven monitoring using his apple watch and/or ZIO monitors in the future.  Given his episodes are symptomatic, I do think it is reasonable to pursue a symptom driven approach to monitoring.  During  episodes, he can check his rhythm using his apple watch.  If symptoms of the atrial tachycardia become more frequent or if his apple watch is no longer providing diagnostic data, discussed moving forward with implanting a new loop recorder.  I also offered going ahead and implanting a loop recorder after today's visit but the patient declined.  I do not think he is at increased risk of a cardiovascular complication based on these episodes of atrial tachycardia while undergoing shoulder surgery.  2.  PFO status post closure Echo shows good closure.  3.  History of stroke Continue aspirin and atorvastatin  Medication Adjustments/Labs and Tests Ordered: Current medicines are reviewed at length with the patient today.  Concerns regarding medicines are outlined above.  No orders of the defined types were placed in this encounter.  No orders of the defined types were placed in this encounter.   Patient Instructions  Medication Instructions:  Your physician recommends that you continue on your current medications as directed. Please refer to the Current Medication list given to you today.  *If you need a refill on your cardiac medications before your next appointment, please call your pharmacy*   Lab Work: None ordered If you have labs (blood work) drawn today and your tests are completely normal, you will receive your results only by: [name] MyChart Message (if you have MyChart) OR . A paper copy in the mail If you have any lab test that is abnormal or we need to change your treatment, we will call you to review the results.   Testing/Procedures: None ordered   Follow-Up: At [facility], you and your health needs are our priority.  As part of our continuing mission to provide you with exceptional heart care, we have created designated Provider Care Teams.  These Care Teams include your primary Cardiologist (physician) and Advanced Practice Providers (APPs -  Physician Assistants and Nurse  Practitioners) who all work together to provide you with the care you need, when you need it.  We recommend signing up for the patient portal called "MyChart".  Sign up information is provided on this After Visit Summary.  MyChart is used to connect with  patients for Virtual Visits (Telemedicine).  Patients are able to view lab/test results, encounter notes, upcoming appointments, etc.  Non-urgent messages can be sent to your provider as well.   To learn more about what you can do with MyChart, go to [email or web address].    Your next appointment:   As needed   The format for your next appointment:   In Person  Provider:   Dr. Lalla BrothersLambert   [facility]!!   Dory HornSherri Price, RN [phone]    Other Instructions       Signed, Steffanie Dunnameron Breckin Zafar, MD, [facility]  07/16/2020 4:29 PM    Electrophysiology [facility]

## 2020-07-16 NOTE — Patient Instructions (Signed)
Medication Instructions:  Your physician recommends that you continue on your current medications as directed. Please refer to the Current Medication list given to you today.  *If you need a refill on your cardiac medications before your next appointment, please call your pharmacy*   Lab Work: None ordered If you have labs (blood work) drawn today and your tests are completely normal, you will receive your results only by: [name] MyChart Message (if you have MyChart) OR . A paper copy in the mail If you have any lab test that is abnormal or we need to change your treatment, we will call you to review the results.   Testing/Procedures: None ordered   Follow-Up: At [facility], you and your health needs are our priority.  As part of our continuing mission to provide you with exceptional heart care, we have created designated Provider Care Teams.  These Care Teams include your primary Cardiologist (physician) and Advanced Practice Providers (APPs -  Physician Assistants and Nurse Practitioners) who all work together to provide you with the care you need, when you need it.  We recommend signing up for the patient portal called "MyChart".  Sign up information is provided on this After Visit Summary.  MyChart is used to connect with patients for Virtual Visits (Telemedicine).  Patients are able to view lab/test results, encounter notes, upcoming appointments, etc.  Non-urgent messages can be sent to your provider as well.   To learn more about what you can do with MyChart, go to [email or web address].    Your next appointment:   As needed   The format for your next appointment:   In Person  Provider:   Dr. Lalla Brothers   Thank you for choosing [facility]!!   Dory Horn, RN [phone]    Other Instructions

## 2020-08-01 ENCOUNTER — Other Ambulatory Visit: Payer: Self-pay

## 2020-08-01 ENCOUNTER — Encounter (HOSPITAL_BASED_OUTPATIENT_CLINIC_OR_DEPARTMENT_OTHER): Payer: Self-pay | Admitting: Orthopaedic Surgery

## 2020-08-04 ENCOUNTER — Other Ambulatory Visit (HOSPITAL_COMMUNITY)
Admission: RE | Admit: 2020-08-04 | Discharge: 2020-08-04 | Disposition: A | Discharge status: Home / Self Care | Payer: Medicare Other | Source: Ambulatory Visit | Attending: Orthopaedic Surgery | Admitting: Orthopaedic Surgery

## 2020-08-04 ENCOUNTER — Encounter (HOSPITAL_BASED_OUTPATIENT_CLINIC_OR_DEPARTMENT_OTHER)
Admission: RE | Admit: 2020-08-04 | Discharge: 2020-08-04 | Disposition: A | Discharge status: Home / Self Care | Payer: Medicare Other | Source: Ambulatory Visit | Attending: Orthopaedic Surgery | Admitting: Orthopaedic Surgery

## 2020-08-04 DIAGNOSIS — Z01812 Encounter for preprocedural laboratory examination: Secondary | ICD-10-CM | POA: Insufficient documentation

## 2020-08-04 DIAGNOSIS — Z20822 Contact with and (suspected) exposure to covid-19: Secondary | ICD-10-CM | POA: Diagnosis not present

## 2020-08-04 LAB — BASIC METABOLIC PANEL
Anion gap: 7 (ref 5–15)
BUN: 18 mg/dL (ref 8–23)
CO2: 31 mmol/L (ref 22–32)
Calcium: 9.3 mg/dL (ref 8.9–10.3)
Chloride: 102 mmol/L (ref 98–111)
Creatinine, Ser: 1.1 mg/dL (ref 0.61–1.24)
GFR calc Af Amer: >60 mL/min (ref >60)
GFR calc non Af Amer: >60 mL/min (ref >60)
Glucose, Bld: 102 mg/dL — ABNORMAL HIGH (ref 70–99)
Potassium: 3.8 mmol/L (ref 3.5–5.1)
Sodium: 140 mmol/L (ref 135–145)

## 2020-08-04 LAB — SARS CORONAVIRUS 2 (TAT 6-24 HRS): SARS Coronavirus 2: NEGATIVE

## 2020-08-04 NOTE — Progress Notes (Signed)

## 2020-08-06 NOTE — H&P (Signed)
PREOPERATIVE H&P  Chief Complaint: RIGHT SHOULDER CARTILEDGE DISORDER, STRAIN OF MUJSCLE AND TENDON OF ROTATOR CFF  HPI: Fred Russell is a 69 y.o. male who is scheduled for RIGHT SHOULDER ARTHROSCOPY DEBRIDEMENT WITH ROTATOR CUFF REPAIR AND OPEN BICEPS TENODESIS.   Patient has a past medical history significant for HTN, PFO, cryptogenic stroke.   Patient is a 69 year-old male who has a complex history in regards to his right shoulder.  He is a Education officer, community.  He has had four surgeries with another surgeon in regards to his cuff with a partial leading cuff that had a repair and three episodes of anchors pulling out.  Eventually he had his hardware all removed and he simply had a cuff that was left torn.  He did not have any further treatment.  He had a frozen shoulder and manipulation afterwards.  His most recent surgery was in November of 2016 and we scanned those images into our system today.  Of note, after one of his surgeries he had patent foramen ovale (PFO) and had a stroke, likely related to a small clot that happened after surgery.  He is frustrated by his shoulder currently.  He has good range of motion, but it bothers him with sleep and overhead activities and sometimes weakness in the shoulder.    His symptoms are rated as moderate to severe, and have been worsening.  This is significantly impairing activities of daily living.    Please see clinic note for further details on this patient's care.    He has elected for surgical management.   Past Medical History:  Diagnosis Date   Arthritis    frozen shoulder   Cryptogenic stroke (HCC)    a. 10/2015-->TEE showed PFO w/ R->L Shunt, s/p closure;  b. s/p MDT Linq.   Essential hypertension    Left foot drop 08/13/2015   PFO (patent foramen ovale)    a. 10/2015 s/p closure w/ 25 mm Amplatzer PFO occluder.   Right shoulder pain    Past Surgical History:  Procedure Laterality Date   CARDIAC CATHETERIZATION N/A 11/14/2015    Procedure: PFO Closure;  Surgeon: Tonny Bollman, MD;  Location: [facility] INVASIVE CV LAB;  Service: Cardiovascular;  Laterality: N/A;   EP IMPLANTABLE DEVICE N/A 10/28/2015   Procedure: Loop Recorder Insertion;  Surgeon: Hillis Range, MD;  Location: [facility] INVASIVE CV LAB;  Service: Cardiovascular;  Laterality: N/A;   Implantable loop recorder removal  10/02/2018   MDT Linq removed by Dr Johney Frame in office   INGUINAL HERNIA REPAIR Right 09/25/2018   Procedure: OPEN RIGHT INGUINAL HERNIA REPAIR WITH INSERTION OF MESH;  Surgeon: Jimmye Norman, MD;  Location: [facility];  Service: General;  Laterality: Right;   ROTATOR CUFF REPAIR     x3   SHOULDER ARTHROSCOPY Right    x3   TEE WITHOUT CARDIOVERSION N/A 10/28/2015   Procedure: TRANSESOPHAGEAL ECHOCARDIOGRAM (TEE);  Surgeon: Laurey Morale, MD;  Location: [facility] ENDOSCOPY;  Service: Cardiovascular;  Laterality: N/A;   Social History   Socioeconomic History   Marital status: Married    Spouse name: Not on file   Number of children: 2   Years of education: DDS   Highest education level: Not on file  Occupational History   Occupation: retired  Tobacco Use   Smoking status: Never Smoker   Smokeless tobacco: Never Used  Substance and Sexual Activity   Alcohol use: No   Drug use: No   Sexual activity: Not on  file  Other Topics Concern   Not on file  Social History Narrative   Patient drinks 2 cups of coffee daily.   Patient is right handed.    Social Determinants of Health   Financial Resource Strain:    Difficulty of Paying Living Expenses: Not on file  Food Insecurity:    Worried About Programme researcher, broadcasting/film/video in the Last Year: Not on file   [employer] of Food in the Last Year: Not on file  Transportation Needs:    Lack of Transportation (Medical): Not on file   Lack of Transportation (Non-Medical): Not on file  Physical Activity:    Days of Exercise per Week: Not on file   Minutes of Exercise per Session: Not  on file  Stress:    Feeling of Stress : Not on file  Social Connections:    Frequency of Communication with Friends and Family: Not on file   Frequency of Social Gatherings with Friends and Family: Not on file   Attends Religious Services: Not on file   Active Member of Clubs or Organizations: Not on file   Attends Banker Meetings: Not on file   Marital Status: Not on file   Family History  Problem Relation Age of Onset   COPD Mother    Heart failure Mother    Skin cancer Mother    ALS Father    No Known Allergies Prior to Admission medications   Medication Sig Start Date End Date Taking? Authorizing Provider  aspirin EC 81 MG tablet Take 81 mg by mouth daily. Swallow whole.   Yes [provider]  atorvastatin (LIPITOR) 40 MG tablet TAKE 1 TABLET BY MOUTH EACH DAY FOR HIGH CHOLESTEROL. 11/30/16  Yes Tonny Bollman, MD  hydrochlorothiazide (HYDRODIURIL) 25 MG tablet Take 25 mg by mouth daily.   Yes [provider]  lisinopril (PRINIVIL,ZESTRIL) 20 MG tablet Take 20 mg 2 (two) times daily by mouth.   Yes [provider]  metoprolol succinate (TOPROL XL) 25 MG 24 hr tablet Take 1 tablet (25 mg total) by mouth daily. 05/29/20 05/24/21 Yes Tonny Bollman, MD    ROS: All other systems have been reviewed and were otherwise negative with the exception of those mentioned in the HPI and as above.  Physical Exam: General: Alert, no acute distress Cardiovascular: No pedal edema Respiratory: No cyanosis, no use of accessory musculature GI: No organomegaly, abdomen is soft and non-tender Skin: No lesions in the area of chief complaint Neurologic: Sensation intact distally Psychiatric: Patient is competent for consent with normal mood and affect Lymphatic: No axillary or cervical lymphadenopathy  MUSCULOSKELETAL:  Examination of the right shoulder shows active forward elevation to 170.  External rotation of 60 and symmetric.  Cuff strength  is 4/5 supraspinatus.    Imaging: X-rays of the shoulder today demonstrate a slightly proximally migrated appearance of his humeral head.  The acromiohumeral interval is greater than 8.    Reviewed images from his previous surgeries, including multiple scope pictures, which demonstrate an avulsion.  Removed multiple anchors and the possibility of chondral damage from his previous anchors that were loose in his joint and a moderate sized cuff tear.    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    Plan: Plan for Procedure(s): RIGHT SHOULDER ARTHROSCOPY DEBRIDEMENT WITH ROTATOR CUFF  REPAIR AND OPEN BICEPS TENODESIS  Based on the patients overall function which is outstanding and his primary complaint being pain, we do think that a repair of his cuff is warranted.  Clearly we are worried about the possibility of hardware failure as he has had multiple issues with this in the past.  Based on his issues with anchors in the past we think that he would benefit from an all suture based repair and we will plan to use the Tornier system that allows for arthroscopic transosseous suture placement.  He understands the risks of this and the possibility of hardware failure.  We would plan on possible biceps tenodesis as well and would use all suture anchors for that.  Risks, benefits and alternatives were all discussed.    The risks benefits and alternatives were discussed with the patient including but not limited to the risks of nonoperative treatment, versus surgical intervention including infection, bleeding, nerve injury,  blood clots, cardiopulmonary complications, morbidity, mortality, among others, and they were willing to proceed.   The patient acknowledged the explanation, agreed to proceed with the plan and consent was signed.   He  has received operative clearance from his cardiologist, Dr. Excell Seltzer.   Operative Plan: Right shoulder scope with transosseous suture placement and biceps tenodesis Discharge Medications: Tylenol, Celebrex, Oxycodone, Zofran DVT Prophylaxis: Xarelto (history of blood clots and stroke) Physical Therapy: Outpatient PT Special Discharge needs: Sling   Vernetta Honey, PA-C  08/06/2020 5:28 PM

## 2020-08-07 ENCOUNTER — Ambulatory Visit (HOSPITAL_BASED_OUTPATIENT_CLINIC_OR_DEPARTMENT_OTHER)
Admission: RE | Admit: 2020-08-07 | Discharge: 2020-08-07 | Disposition: A | Discharge status: Home / Self Care | Payer: Medicare Other | Source: Ambulatory Visit | Attending: Orthopaedic Surgery | Admitting: Orthopaedic Surgery

## 2020-08-07 ENCOUNTER — Encounter (HOSPITAL_BASED_OUTPATIENT_CLINIC_OR_DEPARTMENT_OTHER)
Admission: RE | Disposition: A | Discharge status: Home / Self Care | Payer: Self-pay | Source: Ambulatory Visit | Attending: Orthopaedic Surgery

## 2020-08-07 ENCOUNTER — Other Ambulatory Visit: Payer: Self-pay

## 2020-08-07 ENCOUNTER — Ambulatory Visit (HOSPITAL_BASED_OUTPATIENT_CLINIC_OR_DEPARTMENT_OTHER): Payer: Medicare Other | Admitting: Certified Registered"

## 2020-08-07 ENCOUNTER — Encounter (HOSPITAL_BASED_OUTPATIENT_CLINIC_OR_DEPARTMENT_OTHER): Payer: Self-pay | Admitting: Orthopaedic Surgery

## 2020-08-07 DIAGNOSIS — I10 Essential (primary) hypertension: Secondary | ICD-10-CM | POA: Insufficient documentation

## 2020-08-07 DIAGNOSIS — Z79899 Other long term (current) drug therapy: Secondary | ICD-10-CM | POA: Diagnosis not present

## 2020-08-07 DIAGNOSIS — M1990 Unspecified osteoarthritis, unspecified site: Secondary | ICD-10-CM | POA: Insufficient documentation

## 2020-08-07 DIAGNOSIS — X58XXXA Exposure to other specified factors, initial encounter: Secondary | ICD-10-CM | POA: Insufficient documentation

## 2020-08-07 DIAGNOSIS — Z8673 Personal history of transient ischemic attack (TIA), and cerebral infarction without residual deficits: Secondary | ICD-10-CM | POA: Insufficient documentation

## 2020-08-07 DIAGNOSIS — S43431A Superior glenoid labrum lesion of right shoulder, initial encounter: Secondary | ICD-10-CM | POA: Diagnosis not present

## 2020-08-07 DIAGNOSIS — E785 Hyperlipidemia, unspecified: Secondary | ICD-10-CM | POA: Diagnosis not present

## 2020-08-07 DIAGNOSIS — S46011A Strain of muscle(s) and tendon(s) of the rotator cuff of right shoulder, initial encounter: Secondary | ICD-10-CM | POA: Diagnosis not present

## 2020-08-07 DIAGNOSIS — Z7982 Long term (current) use of aspirin: Secondary | ICD-10-CM | POA: Insufficient documentation

## 2020-08-07 HISTORY — PX: BICEPT TENODESIS: SHX5116

## 2020-08-07 HISTORY — PX: SHOULDER ARTHROSCOPY WITH ROTATOR CUFF REPAIR: SHX5685

## 2020-08-07 HISTORY — DX: Pain in right shoulder: M25.511

## 2020-08-07 SURGERY — ARTHROSCOPY, SHOULDER, WITH ROTATOR CUFF REPAIR
Anesthesia: Regional | Site: Shoulder | Laterality: Right

## 2020-08-07 MED ORDER — SODIUM CHLORIDE 0.9 % IR SOLN
Status: DC | PRN
  Administered 2020-08-07: 12000 mL

## 2020-08-07 MED ORDER — LIDOCAINE 2% (20 MG/ML) 5 ML SYRINGE
INTRAMUSCULAR | Status: AC
  Filled 2020-08-07: qty 5

## 2020-08-07 MED ORDER — SUGAMMADEX SODIUM 200 MG/2ML IV SOLN
INTRAVENOUS | Status: DC | PRN
  Administered 2020-08-07: 200 mg via INTRAVENOUS

## 2020-08-07 MED ORDER — BUPIVACAINE HCL (PF) 0.5 % IJ SOLN
INTRAMUSCULAR | Status: DC | PRN
  Administered 2020-08-07: 15 mL via PERINEURAL

## 2020-08-07 MED ORDER — FENTANYL CITRATE (PF) 100 MCG/2ML IJ SOLN: 25.0000 ug | INTRAMUSCULAR | Status: DC | PRN

## 2020-08-07 MED ORDER — ACETAMINOPHEN 500 MG PO TABS: 1000.0000 mg | ORAL_TABLET | Freq: Three times a day (TID) | ORAL | 0 refills | Status: AC

## 2020-08-07 MED ORDER — EPHEDRINE SULFATE 50 MG/ML IJ SOLN
INTRAMUSCULAR | Status: DC | PRN
  Administered 2020-08-07: 5 mg via INTRAVENOUS
  Administered 2020-08-07: 10 mg via INTRAVENOUS
  Administered 2020-08-07: 5 mg via INTRAVENOUS
  Administered 2020-08-07: 25 mg via INTRAVENOUS
  Administered 2020-08-07: 5 mg via INTRAVENOUS
  Administered 2020-08-07: 10 mg via INTRAVENOUS

## 2020-08-07 MED ORDER — SODIUM CHLORIDE 0.9 % IR SOLN
Status: DC | PRN
  Administered 2020-08-07: 5000 mL

## 2020-08-07 MED ORDER — ONDANSETRON HCL 4 MG/2ML IJ SOLN: 4.0000 mg | Freq: Once | INTRAMUSCULAR | Status: DC | PRN

## 2020-08-07 MED ORDER — FENTANYL CITRATE (PF) 100 MCG/2ML IJ SOLN
INTRAMUSCULAR | Status: AC
  Filled 2020-08-07: qty 2

## 2020-08-07 MED ORDER — LACTATED RINGERS IV SOLN: INTRAVENOUS | Status: DC

## 2020-08-07 MED ORDER — ONDANSETRON HCL 4 MG/2ML IJ SOLN
INTRAMUSCULAR | Status: DC | PRN
  Administered 2020-08-07: 4 mg via INTRAVENOUS

## 2020-08-07 MED ORDER — KETOROLAC TROMETHAMINE 15 MG/ML IJ SOLN: 15.0000 mg | Freq: Once | INTRAMUSCULAR | Status: DC | PRN

## 2020-08-07 MED ORDER — CELECOXIB 100 MG PO CAPS: 100.0000 mg | ORAL_CAPSULE | Freq: Two times a day (BID) | ORAL | 0 refills | Status: AC

## 2020-08-07 MED ORDER — MIDAZOLAM HCL 2 MG/2ML IJ SOLN
INTRAMUSCULAR | Status: AC
  Filled 2020-08-07: qty 2

## 2020-08-07 MED ORDER — ACETAMINOPHEN 500 MG PO TABS
1000.0000 mg | ORAL_TABLET | Freq: Once | ORAL | Status: AC
  Administered 2020-08-07: 1000 mg via ORAL

## 2020-08-07 MED ORDER — PHENYLEPHRINE HCL (PRESSORS) 10 MG/ML IV SOLN
INTRAVENOUS | Status: AC
  Filled 2020-08-07: qty 1

## 2020-08-07 MED ORDER — ROCURONIUM BROMIDE 100 MG/10ML IV SOLN
INTRAVENOUS | Status: DC | PRN
  Administered 2020-08-07: 40 mg via INTRAVENOUS

## 2020-08-07 MED ORDER — RIVAROXABAN 10 MG PO TABS: 10.0000 mg | ORAL_TABLET | Freq: Every day | ORAL | 0 refills | Status: DC

## 2020-08-07 MED ORDER — MIDAZOLAM HCL 2 MG/2ML IJ SOLN
1.0000 mg | Freq: Once | INTRAMUSCULAR | Status: AC
  Administered 2020-08-07: 1 mg via INTRAVENOUS

## 2020-08-07 MED ORDER — PROPOFOL 10 MG/ML IV BOLUS
INTRAVENOUS | Status: AC
  Filled 2020-08-07: qty 20

## 2020-08-07 MED ORDER — OXYCODONE HCL 5 MG PO TABS: ORAL_TABLET | ORAL | 0 refills | Status: AC

## 2020-08-07 MED ORDER — PROPOFOL 10 MG/ML IV BOLUS
INTRAVENOUS | Status: DC | PRN
  Administered 2020-08-07: 200 mg via INTRAVENOUS

## 2020-08-07 MED ORDER — DEXAMETHASONE SODIUM PHOSPHATE 10 MG/ML IJ SOLN
INTRAMUSCULAR | Status: AC
  Filled 2020-08-07: qty 1

## 2020-08-07 MED ORDER — CEFAZOLIN SODIUM-DEXTROSE 2-4 GM/100ML-% IV SOLN
2.0000 g | INTRAVENOUS | Status: AC
  Administered 2020-08-07: 2 g via INTRAVENOUS

## 2020-08-07 MED ORDER — EPHEDRINE 5 MG/ML INJ
INTRAVENOUS | Status: AC
  Filled 2020-08-07: qty 10

## 2020-08-07 MED ORDER — ONDANSETRON HCL 4 MG/2ML IJ SOLN
INTRAMUSCULAR | Status: AC
  Filled 2020-08-07: qty 2

## 2020-08-07 MED ORDER — ACETAMINOPHEN 500 MG PO TABS
ORAL_TABLET | ORAL | Status: AC
  Filled 2020-08-07: qty 2

## 2020-08-07 MED ORDER — OMEPRAZOLE 20 MG PO CPDR: 20.0000 mg | DELAYED_RELEASE_CAPSULE | Freq: Every day | ORAL | 0 refills | Status: DC

## 2020-08-07 MED ORDER — CEFAZOLIN SODIUM-DEXTROSE 2-4 GM/100ML-% IV SOLN
INTRAVENOUS | Status: AC
  Filled 2020-08-07: qty 100

## 2020-08-07 MED ORDER — PHENYLEPHRINE HCL-NACL 10-0.9 MG/250ML-% IV SOLN
INTRAVENOUS | Status: DC | PRN
  Administered 2020-08-07: 25 ug/min via INTRAVENOUS

## 2020-08-07 MED ORDER — BUPIVACAINE LIPOSOME 1.3 % IJ SUSP
INTRAMUSCULAR | Status: DC | PRN
  Administered 2020-08-07: 10 mL via PERINEURAL

## 2020-08-07 MED ORDER — ROCURONIUM BROMIDE 10 MG/ML (PF) SYRINGE
PREFILLED_SYRINGE | INTRAVENOUS | Status: AC
  Filled 2020-08-07: qty 10

## 2020-08-07 MED ORDER — ONDANSETRON HCL 4 MG PO TABS: 4.0000 mg | ORAL_TABLET | Freq: Three times a day (TID) | ORAL | 1 refills | Status: AC | PRN

## 2020-08-07 MED ORDER — PHENYLEPHRINE HCL (PRESSORS) 10 MG/ML IV SOLN
INTRAVENOUS | Status: DC | PRN
  Administered 2020-08-07: 40 ug via INTRAVENOUS
  Administered 2020-08-07: 80 ug via INTRAVENOUS

## 2020-08-07 MED ORDER — LIDOCAINE 2% (20 MG/ML) 5 ML SYRINGE
INTRAMUSCULAR | Status: DC | PRN
  Administered 2020-08-07: 40 mg via INTRAVENOUS

## 2020-08-07 MED ORDER — DEXAMETHASONE SODIUM PHOSPHATE 10 MG/ML IJ SOLN
INTRAMUSCULAR | Status: DC | PRN
  Administered 2020-08-07: 10 mg via INTRAVENOUS

## 2020-08-07 MED ORDER — PHENYLEPHRINE 40 MCG/ML (10ML) SYRINGE FOR IV PUSH (FOR BLOOD PRESSURE SUPPORT)
PREFILLED_SYRINGE | INTRAVENOUS | Status: AC
  Filled 2020-08-07: qty 10

## 2020-08-07 SURGICAL SUPPLY — 77 items
AID PSTN UNV HD RSTRNT DISP (MISCELLANEOUS) ×2
ANCH SUT 1.4 SUT TPE MO-6 BLU (SUTURE) ×4
ANCHOR SUT 1.8 FBRTK KNTLS 2SU (Anchor) ×6 IMPLANT
APL PRP STRL LF DISP 70% ISPRP (MISCELLANEOUS) ×2
BLADE EXCALIBUR 4.0MM X 13CM (MISCELLANEOUS) ×1
BLADE EXCALIBUR 4.0X13 (MISCELLANEOUS) ×3 IMPLANT
BLADE SURG 10 STRL SS (BLADE) IMPLANT
BURR OVAL 8 FLU 4.0MM X 13CM (MISCELLANEOUS)
BURR OVAL 8 FLU 4.0X13 (MISCELLANEOUS) IMPLANT
BURR OVAL 8 FLU 5.0MM X 13CM (MISCELLANEOUS) ×1
BURR OVAL 8 FLU 5.0X13 (MISCELLANEOUS) ×2 IMPLANT
CANNULA 5.75X71 LONG (CANNULA) IMPLANT
CANNULA PASSPORT 5 (CANNULA) IMPLANT
CANNULA PASSPORT 5CM (CANNULA)
CANNULA PASSPORT BUTTON 10-40 (CANNULA) ×3 IMPLANT
CANNULA TWIST IN 8.25X7CM (CANNULA) IMPLANT
CHLORAPREP W/TINT 26 (MISCELLANEOUS) ×4 IMPLANT
CLOSURE STERI-STRIP 1/2X4 (GAUZE/BANDAGES/DRESSINGS) ×1
CLSR STERI-STRIP ANTIMIC 1/2X4 (GAUZE/BANDAGES/DRESSINGS) ×3 IMPLANT
COOLER ICEMAN CLASSIC (MISCELLANEOUS) ×4 IMPLANT
COVER WAND RF STERILE (DRAPES) IMPLANT
DRAPE IMP U-DRAPE 54X76 (DRAPES) ×4 IMPLANT
DRAPE INCISE IOBAN 66X45 STRL (DRAPES) IMPLANT
DRAPE SHOULDER BEACH CHAIR (DRAPES) ×4 IMPLANT
DRSG PAD ABDOMINAL 8X10 ST (GAUZE/BANDAGES/DRESSINGS) ×4 IMPLANT
DW OUTFLOW CASSETTE/TUBE SET (MISCELLANEOUS) ×4 IMPLANT
GAUZE SPONGE 4X4 12PLY STRL (GAUZE/BANDAGES/DRESSINGS) ×4 IMPLANT
GLOVE BIO SURGEON STRL SZ 6 (GLOVE) ×3 IMPLANT
GLOVE BIO SURGEON STRL SZ 6.5 (GLOVE) ×3 IMPLANT
GLOVE BIO SURGEONS STRL SZ 6.5 (GLOVE) ×1
GLOVE BIOGEL PI IND STRL 6.5 (GLOVE) ×4 IMPLANT
GLOVE BIOGEL PI IND STRL 7.0 (GLOVE) ×1 IMPLANT
GLOVE BIOGEL PI IND STRL 8 (GLOVE) ×2 IMPLANT
GLOVE BIOGEL PI INDICATOR 6.5 (GLOVE) ×6
GLOVE BIOGEL PI INDICATOR 7.0 (GLOVE) ×2
GLOVE BIOGEL PI INDICATOR 8 (GLOVE) ×2
GLOVE ECLIPSE 8.0 STRL XLNG CF (GLOVE) ×4 IMPLANT
GLOVE SURG SS PI 7.0 STRL IVOR (GLOVE) ×3 IMPLANT
GOWN STRL REUS W/ TWL LRG LVL3 (GOWN DISPOSABLE) ×5 IMPLANT
GOWN STRL REUS W/TWL LRG LVL3 (GOWN DISPOSABLE) ×12
GOWN STRL REUS W/TWL XL LVL3 (GOWN DISPOSABLE) ×4 IMPLANT
IV NS IRRIG 3000ML ARTHROMATIC (IV SOLUTION) ×18 IMPLANT
KIT STABILIZATION SHOULDER (MISCELLANEOUS) ×4 IMPLANT
KIT STR SPEAR 1.8 FBRTK DISP (KITS) ×3 IMPLANT
LASSO CRESCENT QUICKPASS (SUTURE) ×3 IMPLANT
MANIFOLD NEPTUNE II (INSTRUMENTS) ×4 IMPLANT
NDL SAFETY ECLIPSE 18X1.5 (NEEDLE) ×2 IMPLANT
NDL SCORPION MULTI FIRE (NEEDLE) IMPLANT
NEEDLE HYPO 18GX1.5 SHARP (NEEDLE) ×4
NEEDLE SCORPION MULTI FIRE (NEEDLE) ×4 IMPLANT
PACK ARTHROSCOPY DSU (CUSTOM PROCEDURE TRAY) ×4 IMPLANT
PACK BASIN DAY SURGERY FS (CUSTOM PROCEDURE TRAY) ×4 IMPLANT
PAD COLD SHLDR WRAP-ON (PAD) ×4 IMPLANT
PORT APPOLLO RF 90DEGREE MULTI (SURGICAL WAND) ×4 IMPLANT
RESTRAINT HEAD UNIVERSAL NS (MISCELLANEOUS) ×4 IMPLANT
SHEET MEDIUM DRAPE 40X70 STRL (DRAPES) IMPLANT
SLEEVE SCD COMPRESS KNEE MED (MISCELLANEOUS) ×4 IMPLANT
SLING ARM FOAM STRAP LRG (SOFTGOODS) IMPLANT
SUT FIBERWIRE #2 38 T-5 BLUE (SUTURE)
SUT MNCRL AB 4-0 PS2 18 (SUTURE) ×4 IMPLANT
SUT PDS AB 1 CT  36 (SUTURE) ×4
SUT PDS AB 1 CT 36 (SUTURE) ×1 IMPLANT
SUT TIGER TAPE 7 IN WHITE (SUTURE) IMPLANT
SUT XBRAID 1.4 BLUE/BLACK (SUTURE) ×6 IMPLANT
SUT XBRAID II 1.4 WHITE/BLA (SUTURE) ×6 IMPLANT
SUT XBRAID II 1.4 WHITE/BLU (SUTURE) ×6 IMPLANT
SUT XBRAID PASSING LOOP (SUTURE) ×6 IMPLANT
SUTURE FIBERWR #2 38 T-5 BLUE (SUTURE) IMPLANT
SUTURE TAPE TIGERLINK 1.3MM BL (SUTURE) IMPLANT
SUTURETAPE TIGERLINK 1.3MM BL (SUTURE)
SYR 5ML LL (SYRINGE) ×4 IMPLANT
TAPE FIBER 2MM 7IN #2 BLUE (SUTURE) IMPLANT
TOWEL GREEN STERILE FF (TOWEL DISPOSABLE) ×8 IMPLANT
TUBE CONNECTING 20'X1/4 (TUBING) ×1
TUBE CONNECTING 20X1/4 (TUBING) ×3 IMPLANT
TUBING ARTHROSCOPY IRRIG 16FT (MISCELLANEOUS) ×4 IMPLANT
TUNNELER ARTHRO (MISCELLANEOUS) ×3 IMPLANT

## 2020-08-07 NOTE — Anesthesia Procedure Notes (Signed)
Anesthesia Regional Block: Interscalene brachial plexus block   Pre-Anesthetic Checklist: ,, timeout performed, Correct Patient, Correct Site, Correct Laterality, Correct Procedure, Correct Position, site marked, Risks and benefits discussed,  Surgical consent,  Pre-op evaluation,  At surgeon's request and post-op pain management  Laterality: Right  Prep: chloraprep       Needles:  Injection technique: Single-shot  Needle Type: Echogenic Stimulator Needle     Needle Length: 10cm  Needle Gauge: 20     Additional Needles:   Procedures:,,,, ultrasound used (permanent image in chart),,,,  Narrative:  Start time: 08/07/2020 9:30 AM End time: 08/07/2020 9:40 AM Injection made incrementally with aspirations every 5 mL.  Performed by: Personally  Anesthesiologist: Leonides Grills, MD  Additional Notes: Functioning IV was confirmed and monitors were applied.  A timeout was performed. Sterile prep, hand hygiene and sterile gloves were used. A 20ga BBraun echogenic stimulator needle was used. Negative aspiration and negative test dose prior to incremental administration of local anesthetic. The patient tolerated the procedure well.  Ultrasound guidance: relevent anatomy identified, needle position confirmed, local anesthetic spread visualized around nerve(s), vascular puncture avoided.  Image printed for medical record.

## 2020-08-07 NOTE — Interval H&P Note (Signed)
History and Physical Interval Note:  08/07/2020 9:00 AM  Fred Russell  has presented today for surgery, with the diagnosis of RIGHT SHOULDER CARTILEDGE DISORDER, STRAIN OF MUJSCLE AND TENDON OF ROTATOR CFF.  The various methods of treatment have been discussed with the patient and family. After consideration of risks, benefits and other options for treatment, the patient has consented to  Procedure(s): RIGHT SHOULDER ARTHROSCOPY DEBRIDEMENT WITH ROTATOR CUFF REPAIR AND OPEN BICEPS TENODESIS (Right) as a surgical intervention.  The patient's history has been reviewed, patient examined, no change in status, stable for surgery.  I have reviewed the patient's chart and labs.  Questions were answered to the patient's satisfaction.     Bjorn Pippin

## 2020-08-07 NOTE — Progress Notes (Signed)
Assisted Dr. Ellender with right, ultrasound guided, interscalene  block. Side rails up, monitors on throughout procedure. See vital signs in flow sheet. Tolerated Procedure well. 

## 2020-08-07 NOTE — Anesthesia Procedure Notes (Signed)
Procedure Name: Intubation Date/Time: 08/07/2020 9:56 AM Performed by: Lavonia Dana, CRNA Pre-anesthesia Checklist: Patient identified, Emergency Drugs available, Suction available and Patient being monitored Patient Re-evaluated:Patient Re-evaluated prior to induction Oxygen Delivery Method: Circle system utilized Preoxygenation: Pre-oxygenation with 100% oxygen Induction Type: IV induction Ventilation: Mask ventilation without difficulty Laryngoscope Size: Mac and 4 Grade View: Grade II Tube type: Oral Tube size: 7.5 mm Number of attempts: 1 Airway Equipment and Method: Stylet and Bite block Placement Confirmation: ETT inserted through vocal cords under direct vision,  positive ETCO2 and breath sounds checked- equal and bilateral Secured at: 24 cm Tube secured with: Tape Dental Injury: Teeth and Oropharynx as per pre-operative assessment

## 2020-08-07 NOTE — Anesthesia Postprocedure Evaluation (Signed)
Anesthesia Post Note  Patient: Fred Russell  Procedure(s) Performed: RIGHT SHOULDER ARTHROSCOPY DEBRIDEMENT WITH ROTATOR CUFF REPAIR AND OPEN BICEPS TENODESIS (Right Shoulder) BICEPS TENODESIS (Right Shoulder)     Patient location during evaluation: PACU Anesthesia Type: Regional and General Level of consciousness: awake and alert Pain management: pain level controlled Vital Signs Assessment: post-procedure vital signs reviewed and stable Respiratory status: spontaneous breathing, nonlabored ventilation, respiratory function stable and patient connected to nasal cannula oxygen Cardiovascular status: blood pressure returned to baseline and stable Postop Assessment: no apparent nausea or vomiting Anesthetic complications: no   No complications documented.  Last Vitals:  Vitals:   08/07/20 1215 08/07/20 1248  BP: 131/84 (!) 142/81  Pulse: 62 65  Resp: 15 16  Temp:  36.8 C  SpO2: 96% 96%    Last Pain:  Vitals:   08/07/20 1248  TempSrc:   PainSc: 0-No pain                 Providence Stivers P Sharisa Toves

## 2020-08-07 NOTE — Anesthesia Preprocedure Evaluation (Addendum)
Anesthesia Evaluation  Patient identified by MRN, date of birth, ID band Patient awake    Reviewed: Allergy & Precautions, NPO status , Patient's Chart, lab work & pertinent test results  Airway Mallampati: I  TM Distance: >3 FB Neck ROM: Full    Dental no notable dental hx.    Pulmonary neg pulmonary ROS,    Pulmonary exam normal breath sounds clear to auscultation       Cardiovascular hypertension, Pt. on medications and Pt. on home beta blockers  Rhythm:Regular Rate:Bradycardia  ECG: SB, rate 52  ECHO: 1. Left ventricular ejection fraction, by estimation, is 55%. The left ventricle has normal function. The left ventricle has no regional wall motion abnormalities. Left ventricular diastolic parameters are consistent with Grade I diastolic dysfunction (impaired relaxation). 2. Right ventricular systolic function is normal. The right ventricular size is normal. There is normal pulmonary artery systolic pressure. The estimated right ventricular systolic pressure is 29.9 mmHg. 3. Left atrial size was mildly dilated. 4. Right atrial size was moderately dilated. 5. Amplatzer PFO closure device is present, no significant shunting noted. 6. The mitral valve is normal in structure. Trivial mitral valve regurgitation. No evidence of mitral stenosis. 7. The aortic valve is tricuspid. Aortic valve regurgitation is mild. No aortic stenosis is present. 8. Aortic dilatation noted. 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 9. The inferior vena cava is dilated in size with >50% respiratory variability, suggesting right atrial pressure of 8 mmHg.   Neuro/Psych CVA, No Residual Symptoms negative psych ROS   GI/Hepatic negative GI ROS, Neg liver ROS,   Endo/Other  negative endocrine ROS  Renal/GU negative Renal ROS     Musculoskeletal negative musculoskeletal ROS (+)   Abdominal   Peds  Hematology HLD    Anesthesia Other Findings RIGHT SHOULDER CARTILEDGE DISORDER, STRAIN OF MUJSCLE AND TENDON OF ROTATOR CFF  Reproductive/Obstetrics                            Anesthesia Physical Anesthesia Plan  ASA: II  Anesthesia Plan: General and Regional   Post-op Pain Management: GA combined w/ Regional for post-op pain   Induction: Intravenous  PONV Risk Score and Plan: 2 and Ondansetron, Dexamethasone, Midazolam and Treatment may vary due to age or medical condition  Airway Management Planned: Oral ETT  Additional Equipment:   Intra-op Plan:   Post-operative Plan: Extubation in OR  Informed Consent: I have reviewed the patients History and Physical, chart, labs and discussed the procedure including the risks, benefits and alternatives for the proposed anesthesia with the patient or authorized representative who has indicated his/her understanding and acceptance.     Dental advisory given  Plan Discussed with: CRNA  Anesthesia Plan Comments:        Anesthesia Quick Evaluation

## 2020-08-07 NOTE — Discharge Instructions (Signed)
Next dose of Tylenol can be given at 3:15pm if needed.   Post Anesthesia Home Care Instructions  Activity: Get plenty of rest for the remainder of the day. A responsible individual must stay with you for 24 hours following the procedure.  For the next 24 hours, DO NOT: -Drive a car -Advertising copywriter -Drink alcoholic beverages -Take any medication unless instructed by your physician -Make any legal decisions or sign important papers.  Meals: Start with liquid foods such as gelatin or soup. Progress to regular foods as tolerated. Avoid greasy, spicy, heavy foods. If nausea and/or vomiting occur, drink only clear liquids until the nausea and/or vomiting subsides. Call your physician if vomiting continues.  Special Instructions/Symptoms: Your throat may feel dry or sore from the anesthesia or the breathing tube placed in your throat during surgery. If this causes discomfort, gargle with warm salt water. The discomfort should disappear within 24 hours.      Regional Anesthesia Blocks  1. Numbness or the inability to move the "blocked" extremity may last from 3-48 hours after placement. The length of time depends on the medication injected and your individual response to the medication. If the numbness is not going away after 48 hours, call your surgeon.  2. The extremity that is blocked will need to be protected until the numbness is gone and the  Strength has returned. Because you cannot feel it, you will need to take extra care to avoid injury. Because it may be weak, you may have difficulty moving it or using it. You may not know what position it is in without looking at it while the block is in effect.  3. For blocks in the legs and feet, returning to weight bearing and walking needs to be done carefully. You will need to wait until the numbness is entirely gone and the strength has returned. You should be able to move your leg and foot normally before you try and bear weight or walk. You  will need someone to be with you when you first try to ensure you do not fall and possibly risk injury.  4. Bruising and tenderness at the needle site are common side effects and will resolve in a few days.  5. Persistent numbness or new problems with movement should be communicated to the surgeon or the [facility] [id number] [facility] [phone]).

## 2020-08-07 NOTE — Transfer of Care (Signed)
Immediate Anesthesia Transfer of Care Note  Patient: Fred Russell  Procedure(s) Performed: RIGHT SHOULDER ARTHROSCOPY DEBRIDEMENT WITH ROTATOR CUFF REPAIR AND OPEN BICEPS TENODESIS (Right Shoulder) BICEPS TENODESIS (Right Shoulder)  Patient Location: PACU  Anesthesia Type:GA combined with regional for post-op pain  Level of Consciousness: awake, alert  and oriented  Airway & Oxygen Therapy: Patient Spontanous Breathing and Patient connected to face mask oxygen  Post-op Assessment: Report given to RN and Post -op Vital signs reviewed and stable  Post vital signs: Reviewed and stable  Last Vitals:  Vitals Value Taken Time  BP 131/90 08/07/20 1151  Temp 36.5 C 08/07/20 1152  Pulse 60 08/07/20 1155  Resp 18 08/07/20 1155  SpO2 100 % 08/07/20 1155  Vitals shown include unvalidated device data.  Last Pain:  Vitals:   08/07/20 0911  TempSrc: Oral  PainSc: 0-No pain      Patients Stated Pain Goal: 1 (08/07/20 0911)  Complications: No complications documented.

## 2020-08-12 ENCOUNTER — Encounter (HOSPITAL_BASED_OUTPATIENT_CLINIC_OR_DEPARTMENT_OTHER): Payer: Self-pay | Admitting: Orthopaedic Surgery

## 2020-08-13 NOTE — Op Note (Signed)
Orthopaedic Surgery Operative Note (CSN: [id number])  Fred Russell  1951/05/18 Date of Surgery: 08/07/2020   Diagnoses:  Multiple previous failed rotator cuff repairs and biceps tendinitis  Procedure: Arthroscopic extensive debridement Arthroscopic rotator cuff repair Arthroscopic biceps tenodesis   Operative Finding Exam under anesthesia: Full motion no limitation no instability Articular space: No loose bodies, capsule intact, anterior labral fraying debrided back to stable base Chondral surfaces: Mild scattered changes grade 2 consistent with previous loose anchor in the joint.  No sign of full-thickness cartilage loss Biceps: Significant injection of the tendon and a type I SLAP tear Subscapularis: Upper border fraying but no obvious tearing.  Debrided back. Superior Cuff: Unretracted full-thickness supraspinatus tear and anterior infraspinatus tear Bursal side: As above  Successful completion of the planned procedure.  Bone quality was rather robust considering the patient has had 3-4 previous anchors that had pulled loose.  We did a atypical all suture type repair using an arthroscopic bone tunneler and had robust fixation and a double row level of compression of the tissue to the bone.  We were very happy with her final repair.  Biceps tenodesis was routine.   Post-operative plan: The patient will be non-weightbearing in a sling with therapy to start after first visit perhaps about the 2-week mark.  The patient will be discharged home.  DVT prophylaxis not indicated in ambulatory upper extremity patient without known risk factors.   Pain control with PRN pain medication preferring oral medicines.  Follow up plan will be scheduled in approximately 7 days for incision check and XR.  Post-Op Diagnosis: Same Surgeons:Primary: Bjorn Pippin, MD Assistants:Caroline McBane PA-C Location: [facility] OR ROOM 6 Anesthesia: General with Exparel interscalene block Antibiotics: Ancef 2  g Tourniquet time: None Estimated Blood Loss: Minimal Complications: None Specimens: None Implants: Implant Name Type Inv. Item Serial No. Manufacturer Lot No. LRB No. Used Action  ANCHOR SUT 1.8 FIBERTAK 2 SUT - [id number] Anchor ANCHOR SUT 1.8 FIBERTAK 2 SUT  [employer] [id number] Right 1 Implanted  ANCHOR SUT 1.8 FIBERTAK 2 SUT - [id number] Anchor ANCHOR SUT 1.8 FIBERTAK 2 SUT  [employer] [id number] Right 1 Implanted    Indications for Surgery:   Fred Russell is a 69 y.o. male with continued shoulder pain refractory to nonoperative measures for extended period of time.  The patient had multiple previous surgeries with multiple swivel lock anchors that had pulled loose and become loose within the joint.  He was frustrated by this and overall had good function however continued to have pain.  We did not want to proceed with reverse arthroplasty if possible and the patient wanted consideration for an all suture based repair.  The risks and benefits were explained at length including but not limited to continued pain, cuff failure, biceps tenodesis failure, stiffness, need for further surgery and infection.   Procedure:   Patient was correctly identified in the preoperative holding area and operative site marked.  Patient brought to OR and positioned beachchair on an Cadillac table ensuring that all bony prominences were padded and the head was in an appropriate location.  Anesthesia was induced and the operative shoulder was prepped and draped in the usual sterile fashion.  Timeout was called preincision.  A standard posterior viewing portal was made after localizing the portal with a spinal needle.  An anterior accessory portal was also made.  After clearing the articular space the camera was positioned in the subacromial space.  Findings above.    Extensive  debridement was performed of the anterior interval tissue, labral fraying and the bursa.  Biceps tenodesis: We marked the tendon and then  performed a tenotomy and debridement of the stump in the articular space. We then identified the biceps tendon in its groove suprapec with the arthroscope in the lateral portal taking care to move from lateral to medial to avoid injury to the subscapularis. At that point we unroofed the tendon itself and mobilized it. An accessory anterior portal was made in line with the tendon and we grasped it from the anterior superior portal and worked from the accessory anterior portal. Two Fibertak 1.43mm knotless anchors were placed in the groove and the tendon was secured in a luggage loop style fashion with a pass of the limb of suture through the tendon using a scorpion device to avoid pull-through.  Repair was completed with good tension on the tendon.  Residual stump of the tendon was removed after being resected with a RF ablator.   Rotator cuff repair: We identified the full-thickness cuff tear and mobilized it.  At that point we were able to clearly pull it over to the tuberosity.  We cleared soft tissue off the tuberosity and freshen the healing surface of the tuberosity in order to facilitate cuff integration.  We then cleared soft tissue over the lateral border of the humerus moving distally to ensure that our attention of her sutures would be maintained and that there was no obvious interference from his previous anchor placement.  At this point we used the Tornier ArthroTunneler device placed through a lateral portal to facilitate arthroscopic transosseous tunnels at the anterior and posterior aspects of this tear.  We then shuttled 3 sutures into each tunnel and performed a medial mattress type suture with simple sutures medially and laterally and an X configuration for tissue compression centrally.  This resembled a speed bridge type repair done with tape.  We were extraordinarily happy with our final fixation and tendon approximation and compression of the bone.  This was robust repair and we had no qualms  on bone quality or tissue quality.  The incisions were closed with absorbable monocryl and steri strips.  A sterile dressing was placed along with a sling. The patient was awoken from general anesthesia and taken to the PACU in stable condition without complication.   Alfonse Alpers, PA-C, present and scrubbed throughout the case, critical for completion in a timely fashion, and for retraction, instrumentation, closure.

## 2020-10-18 ENCOUNTER — Ambulatory Visit
Admission: EM | Admit: 2020-10-18 | Discharge: 2020-10-18 | Disposition: A | Discharge status: Home / Self Care | Payer: Medicare Other | Attending: Emergency Medicine | Admitting: Emergency Medicine

## 2020-10-18 ENCOUNTER — Other Ambulatory Visit: Payer: Self-pay

## 2020-10-18 ENCOUNTER — Encounter: Payer: Self-pay | Admitting: Emergency Medicine

## 2020-10-18 DIAGNOSIS — J029 Acute pharyngitis, unspecified: Secondary | ICD-10-CM | POA: Diagnosis present

## 2020-10-18 LAB — POCT RAPID STREP A (OFFICE): Rapid Strep A Screen: NEGATIVE

## 2020-10-18 NOTE — ED Triage Notes (Addendum)
Sore throat , chills since Wednesday. Pt would like to be tested for strep.  Pt had covid test done today.

## 2020-10-18 NOTE — ED Provider Notes (Signed)
[name]   [id number] 10/18/20 Arrival Time: 1419  IR:WERX THROAT  SUBJECTIVE: History from: patient.  Fred Russell is a 69 y.o. male w Modena Jansky to the urgent care for complaint of sore throat continue for the past 3 days.  Denies exposure to strep, flu or mono, or precipitating event.  Has tried OTC medication without relief.  Symptoms are made worse with swallowing, but tolerating liquids and own secretions without difficulty.  Reports he has a COVID-19 test completed and he is awaiting COVID-19 test result. Denies previous symptoms in the past.   Denies fever, chills, fatigue, ear pain, sinus pain, rhinorrhea, nasal congestion, cough, SOB, wheezing, chest pain, nausea, rash, changes in bowel or bladder habits.       ROS: As per HPI.  All other pertinent ROS negative.       Past Medical History:  Diagnosis Date  . Arthritis    frozen shoulder  . Cryptogenic stroke (HCC)    a. 10/2015-->TEE showed PFO w/ R->L Shunt, s/p closure;  b. s/p MDT Linq.  . Essential hypertension   . Left foot drop 08/13/2015  . PFO (patent foramen ovale)    a. 10/2015 s/p closure w/ 25 mm Amplatzer PFO occluder.  . Right shoulder pain    Past Surgical History:  Procedure Laterality Date  . BICEPT TENODESIS Right 08/07/2020   Procedure: BICEPS TENODESIS;  Surgeon: Bjorn Pippin, MD;  Location: [facility];  Service: Orthopedics;  Laterality: Right;  . CARDIAC CATHETERIZATION N/A 11/14/2015   Procedure: PFO Closure;  Surgeon: Tonny Bollman, MD;  Location: [facility] INVASIVE CV LAB;  Service: Cardiovascular;  Laterality: N/A;  . EP IMPLANTABLE DEVICE N/A 10/28/2015   Procedure: Loop Recorder Insertion;  Surgeon: Hillis Range, MD;  Location: [facility] INVASIVE CV LAB;  Service: Cardiovascular;  Laterality: N/A;  . Implantable loop recorder removal  10/02/2018   MDT Linq removed by Dr Johney Frame in office  . INGUINAL HERNIA REPAIR Right 09/25/2018   Procedure: OPEN RIGHT INGUINAL HERNIA REPAIR  WITH INSERTION OF MESH;  Surgeon: Jimmye Norman, MD;  Location: [facility];  Service: General;  Laterality: Right;  . ROTATOR CUFF REPAIR     x3  . SHOULDER ARTHROSCOPY Right    x3  . SHOULDER ARTHROSCOPY WITH ROTATOR CUFF REPAIR Right 08/07/2020   Procedure: RIGHT SHOULDER ARTHROSCOPY DEBRIDEMENT WITH ROTATOR CUFF REPAIR AND OPEN BICEPS TENODESIS;  Surgeon: Bjorn Pippin, MD;  Location: [facility];  Service: Orthopedics;  Laterality: Right;  . TEE WITHOUT CARDIOVERSION N/A 10/28/2015   Procedure: TRANSESOPHAGEAL ECHOCARDIOGRAM (TEE);  Surgeon: Laurey Morale, MD;  Location: [facility];  Service: Cardiovascular;  Laterality: N/A;   No Known Allergies No current facility-administered medications on file prior to encounter.   Current Outpatient Medications on File Prior to Encounter  Medication Sig Dispense Refill  . atorvastatin (LIPITOR) 40 MG tablet TAKE 1 TABLET BY MOUTH EACH DAY FOR HIGH CHOLESTEROL. 90 tablet 2  . hydrochlorothiazide (HYDRODIURIL) 25 MG tablet Take 25 mg by mouth daily.    [name] lisinopril (PRINIVIL,ZESTRIL) 20 MG tablet Take 20 mg 2 (two) times daily by mouth.    . metoprolol succinate (TOPROL XL) 25 MG 24 hr tablet Take 1 tablet (25 mg total) by mouth daily. 90 tablet 3  . omeprazole (PRILOSEC) 20 MG capsule Take 1 capsule (20 mg total) by mouth daily. 30 days for gastroprotection while taking NSAIDs. 30 capsule 0  . rivaroxaban (XARELTO) 10 MG TABS tablet Take  1 tablet (10 mg total) by mouth daily. 30 tablet 0   Social History   Socioeconomic History  . Marital status: Married    Spouse name: Not on file  . Number of children: 2  . Years of education: DDS  . Highest education level: Not on file  Occupational History  . Occupation: retired  Tobacco Use  . Smoking status: Never Smoker  . Smokeless tobacco: Never Used  Substance and Sexual Activity  . Alcohol use: No  . Drug use: No  . Sexual activity: Not on file  Other Topics  Concern  . Not on file  Social History Narrative   Patient drinks 2 cups of coffee daily.   Patient is right handed.    Social Determinants of Health   Financial Resource Strain:   . Difficulty of Paying Living Expenses: Not on file  Food Insecurity:   . Worried About Programme researcher, broadcasting/film/video in the Last Year: Not on file  . Ran Out of Food in the Last Year: Not on file  Transportation Needs:   . Lack of Transportation (Medical): Not on file  . Lack of Transportation (Non-Medical): Not on file  Physical Activity:   . Days of Exercise per Week: Not on file  . Minutes of Exercise per Session: Not on file  Stress:   . Feeling of Stress : Not on file  Social Connections:   . Frequency of Communication with Friends and Family: Not on file  . Frequency of Social Gatherings with Friends and Family: Not on file  . Attends Religious Services: Not on file  . Active Member of Clubs or Organizations: Not on file  . Attends Banker Meetings: Not on file  . Marital Status: Not on file  Intimate Partner Violence:   . Fear of Current or Ex-Partner: Not on file  . Emotionally Abused: Not on file  . Physically Abused: Not on file  . Sexually Abused: Not on file   Family History  Problem Relation Age of Onset  . COPD Mother   . Heart failure Mother   . Skin cancer Mother   . ALS Father     OBJECTIVE:  Vitals:   10/18/20 1533 10/18/20 1538  BP:  (!) 156/96  Pulse:  65  Resp:  18  Temp:  98.1 F (36.7 C)  TempSrc:  Oral  SpO2:  96%  Weight: 190 lb 4.1 oz (86.3 kg)   Height: 6\' 2"  (1.88 m)      General appearance: alert; appears fatigued, but nontoxic, speaking in full sentences and managing own secretions HEENT: NCAT; Ears: EACs clear, TMs pearly gray with visible cone of light, without erythema; Eyes: PERRL, EOMI grossly; Nose: no obvious rhinorrhea; Throat: oropharynx clear, tonsils 1+ and mildly erythematous without white tonsillar exudates, uvula midline Neck: supple  without LAD Lungs: CTA bilaterally without adventitious breath sounds; cough absent Heart: regular rate and rhythm.  Radial pulses 2+ symmetrical bilaterally Skin: warm and dry Psychological: alert and cooperative; normal mood and affect  LABS: Results for orders placed or performed during the hospital encounter of 10/18/20 (from the past 24 hour(s))  POCT rapid strep A     Status: None   Collection Time: 10/18/20  3:45 PM  Result Value Ref Range   Rapid Strep A Screen Negative Negative     ASSESSMENT & PLAN:  1. Sore throat     No orders of the defined types were placed in this encounter.   Discharge  instructions  Strep test negative, will send out for culture and we will call you with results Get plenty of rest and push fluids May use OTC throat lozenges such as Halls, Cepacol or Vicks to soothe throat   Drink warm or cool liquids, use throat lozenges, or popsicles to help alleviate symptoms Take OTC ibuprofen or tylenol as needed for pain Follow up with PCP if symptoms persists Return or go to ER if patient has any new or worsening symptoms such as fever, chills, nausea, vomiting, worsening sore throat, cough, abdominal pain, chest pain, changes in bowel or bladder habits, etc...  Reviewed expectations re: course of current medical issues. Questions answered. Outlined signs and symptoms indicating need for more acute intervention. Patient verbalized understanding. After Visit Summary given.        Durward Parcel, FNP 10/18/20 1559

## 2020-10-18 NOTE — Discharge Instructions (Addendum)
Strep test negative, will send out for culture and we will call you with results Get plenty of rest and push fluids May use OTC throat lozenges such as Halls, Cepacol or Vicks to soothe throat   Drink warm or cool liquids, use throat lozenges, or popsicles to help alleviate symptoms Take OTC ibuprofen or tylenol as needed for pain Follow up with PCP if symptoms persists Return or go to ER if patient has any new or worsening symptoms such as fever, chills, nausea, vomiting, worsening sore throat, cough, abdominal pain, chest pain, changes in bowel or bladder habits, etc..

## 2020-10-21 LAB — CULTURE, GROUP A STREP (THRC)

## 2020-10-29 ENCOUNTER — Encounter: Payer: Self-pay | Admitting: Podiatry

## 2020-10-29 ENCOUNTER — Ambulatory Visit (INDEPENDENT_AMBULATORY_CARE_PROVIDER_SITE_OTHER): Payer: Medicare Other

## 2020-10-29 ENCOUNTER — Ambulatory Visit (INDEPENDENT_AMBULATORY_CARE_PROVIDER_SITE_OTHER): Payer: Medicare Other | Admitting: Podiatry

## 2020-10-29 ENCOUNTER — Other Ambulatory Visit: Payer: Self-pay

## 2020-10-29 DIAGNOSIS — M2041 Other hammer toe(s) (acquired), right foot: Secondary | ICD-10-CM | POA: Diagnosis not present

## 2020-10-29 DIAGNOSIS — I2510 Atherosclerotic heart disease of native coronary artery without angina pectoris: Secondary | ICD-10-CM

## 2020-10-29 DIAGNOSIS — M2042 Other hammer toe(s) (acquired), left foot: Secondary | ICD-10-CM

## 2020-10-29 NOTE — Progress Notes (Signed)
Subjective:   Patient ID: Fred Russell, male   DOB: 69 y.o.   MRN: [id number]   HPI Patient presents stating these toes are bothering him more more and I need to get them corrected.  Has had shoulder surgery in the last 8 to 10 weeks and states the toes make it hard for him to wear shoe gear comfortably   ROS      Objective:  Physical Exam  Neurovascular status intact with patient found to have digital contracture digits 234 of the right foot with proximal deformity second and third distal deformity fourth with MPJ involvement second and third toes.  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  He understands he of pins in the toes for 5 weeks a total recovery will take 6 months to 1 year and is scheduled for outpatient surgery  X-rays indicate that there is significant contracture of the digits with rigid contracture digits 2 3 right foot

## 2020-11-24 ENCOUNTER — Encounter: Payer: Self-pay | Admitting: Cardiovascular Disease

## 2020-11-24 ENCOUNTER — Ambulatory Visit (INDEPENDENT_AMBULATORY_CARE_PROVIDER_SITE_OTHER): Payer: Medicare Other | Admitting: Cardiovascular Disease

## 2020-11-24 ENCOUNTER — Other Ambulatory Visit: Payer: Self-pay

## 2020-11-24 VITALS — BP 122/80 | HR 50 | Ht 74.0 in | Wt 196.2 lb

## 2020-11-24 DIAGNOSIS — Q2112 Patent foramen ovale: Secondary | ICD-10-CM

## 2020-11-24 DIAGNOSIS — I77810 Thoracic aortic ectasia: Secondary | ICD-10-CM

## 2020-11-24 DIAGNOSIS — I471 Supraventricular tachycardia: Secondary | ICD-10-CM | POA: Diagnosis not present

## 2020-11-24 DIAGNOSIS — E782 Mixed hyperlipidemia: Secondary | ICD-10-CM

## 2020-11-24 DIAGNOSIS — I2510 Atherosclerotic heart disease of native coronary artery without angina pectoris: Secondary | ICD-10-CM

## 2020-11-24 DIAGNOSIS — I480 Paroxysmal atrial fibrillation: Secondary | ICD-10-CM | POA: Diagnosis not present

## 2020-11-24 DIAGNOSIS — I10 Essential (primary) hypertension: Secondary | ICD-10-CM

## 2020-11-24 DIAGNOSIS — Q211 Atrial septal defect: Secondary | ICD-10-CM

## 2020-11-24 MED ORDER — HYDROCODONE-ACETAMINOPHEN 10-325 MG PO TABS: 1.0000 | ORAL_TABLET | Freq: Three times a day (TID) | ORAL | 0 refills | Status: AC | PRN

## 2020-11-24 NOTE — Progress Notes (Signed)
Cardiology Office Note:    Date:  11/24/2020   ID:  Fred Russell, Fred Russell [birth year], MRN [id number]  PCP:  Gareth Morgan, MD  [facility] Cardiologist:  Tonny Bollman, MD  [facility] Electrophysiologist:  None   Referring MD: Gareth Morgan, MD   Chief Complaint  Patient presents with  . Hypertension    History of Present Illness:    TAYRON HUNNELL is a 70 y.o. male with a hx of cryptogenic stroke, PFO status post transcatheter closure in 2016, paroxysmal atrial fibrillation occurring after PFO closure and detected on a loop recorder, atrial tachycardia, and aortic root dilatation. The patient presents today for follow-up evaluation. He is here alone. He has undergone another right shoulder surgery since I have last seen him and he reports that this was successful.   The patient has been monitoring his blood pressures at home.  He brings him in for review today.  He has a few readings greater than 140/80, but the vast majority are in the 130s over 70s with a few readings in the 110 range.  He has no chest pain, heart palpitations, or shortness of breath.  He denies edema, orthopnea, or PND.  He has had some "vertigo" when he first gets up in the morning.  He states that this is postural and he has to sit on the bedside for a minute or 2.  No frank syncope.  Past Medical History:  Diagnosis Date  . Arthritis    frozen shoulder  . Cryptogenic stroke (HCC)    a. 10/2015-->TEE showed PFO w/ R->L Shunt, s/p closure;  b. s/p MDT Linq.  . Essential hypertension   . Left foot drop 08/13/2015  . PFO (patent foramen ovale)    a. 10/2015 s/p closure w/ 25 mm Amplatzer PFO occluder.  . Right shoulder pain     Past Surgical History:  Procedure Laterality Date  . BICEPT TENODESIS Right 08/07/2020   Procedure: BICEPS TENODESIS;  Surgeon: Bjorn Pippin, MD;  Location: [facility];  Service: Orthopedics;  Laterality: Right;  . CARDIAC CATHETERIZATION N/A 11/14/2015    Procedure: PFO Closure;  Surgeon: Tonny Bollman, MD;  Location: [facility] INVASIVE CV LAB;  Service: Cardiovascular;  Laterality: N/A;  . EP IMPLANTABLE DEVICE N/A 10/28/2015   Procedure: Loop Recorder Insertion;  Surgeon: Hillis Range, MD;  Location: [facility] INVASIVE CV LAB;  Service: Cardiovascular;  Laterality: N/A;  . Implantable loop recorder removal  10/02/2018   MDT Linq removed by Dr Johney Frame in office  . INGUINAL HERNIA REPAIR Right 09/25/2018   Procedure: OPEN RIGHT INGUINAL HERNIA REPAIR WITH INSERTION OF MESH;  Surgeon: Jimmye Norman, MD;  Location: [facility];  Service: General;  Laterality: Right;  . ROTATOR CUFF REPAIR     x3  . SHOULDER ARTHROSCOPY Right    x3  . SHOULDER ARTHROSCOPY WITH ROTATOR CUFF REPAIR Right 08/07/2020   Procedure: RIGHT SHOULDER ARTHROSCOPY DEBRIDEMENT WITH ROTATOR CUFF REPAIR AND OPEN BICEPS TENODESIS;  Surgeon: Bjorn Pippin, MD;  Location: [facility];  Service: Orthopedics;  Laterality: Right;  . TEE WITHOUT CARDIOVERSION N/A 10/28/2015   Procedure: TRANSESOPHAGEAL ECHOCARDIOGRAM (TEE);  Surgeon: Laurey Morale, MD;  Location: [facility]COPY;  Service: Cardiovascular;  Laterality: N/A;    Current Medications: Current Meds  Medication Sig  . aspirin EC 81 MG tablet Take 81 mg by mouth daily. Swallow whole.  [name] atorvastatin (LIPITOR) 40 MG tablet TAKE 1 TABLET BY MOUTH EACH DAY FOR HIGH  CHOLESTEROL.  . hydrochlorothiazide (HYDRODIURIL) 25 MG tablet Take 25 mg by mouth daily.  [name] lisinopril (PRINIVIL,ZESTRIL) 20 MG tablet Take 20 mg 2 (two) times daily by mouth.  . metoprolol succinate (TOPROL XL) 25 MG 24 hr tablet Take 1 tablet (25 mg total) by mouth daily.     Allergies:   Patient has no known allergies.   Social History   Socioeconomic History  . Marital status: Married    Spouse name: Not on file  . Number of children: 2  . Years of education: DDS  . Highest education level: Not on file  Occupational History  . Occupation:  retired  Tobacco Use  . Smoking status: Never Smoker  . Smokeless tobacco: Never Used  Substance and Sexual Activity  . Alcohol use: No  . Drug use: No  . Sexual activity: Not on file  Other Topics Concern  . Not on file  Social History Narrative   Patient drinks 2 cups of coffee daily.   Patient is right handed.    Social Determinants of Health   Financial Resource Strain: Not on file  Food Insecurity: Not on file  Transportation Needs: Not on file  Physical Activity: Not on file  Stress: Not on file  Social Connections: Not on file     Family History: The patient's family history includes ALS in his father; COPD in his mother; Heart failure in his mother; Skin cancer in his mother.  ROS:   Please see the history of present illness.    All other systems reviewed and are negative.  EKGs/Labs/Other Studies Reviewed:    The following studies were reviewed today: Echo 06-16-2020: IMPRESSIONS    1. Left ventricular ejection fraction, by estimation, is 55%. The left  ventricle has normal function. The left ventricle has no regional wall  motion abnormalities. Left ventricular diastolic parameters are consistent  with Grade I diastolic dysfunction  (impaired relaxation).  2. Right ventricular systolic function is normal. The right ventricular  size is normal. There is normal pulmonary artery systolic pressure. The  estimated right ventricular systolic pressure is 29.9 mmHg.  3. Left atrial size was mildly dilated.  4. Right atrial size was moderately dilated.  5. Amplatzer PFO closure device is present, no significant shunting  noted.  6. The mitral valve is normal in structure. Trivial mitral valve  regurgitation. No evidence of mitral stenosis.  7. The aortic valve is tricuspid. Aortic valve regurgitation is mild. No  aortic stenosis is present.  8. Aortic dilatation noted. 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  9. The inferior vena  cava is dilated in size with >50% respiratory  variability, suggesting right atrial pressure of 8 mmHg.   Event Monitor 06-27-2020: Study Highlights  1. The basic rhythm is normal sinus with an average HR of 56 bpm 2. There is no atrial fibrillation or flutter 3. PVC's occur with a 1.8% burden 4. Multiple short supraventricular runs are present 5. Few runs of NSVT (8 beats of fast VT and longer wide complex runs at 150 bpm or less)  CT Coronary 06-12-2019: IMPRESSION: 1. 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 It is in close proximity to the descending thoracic aorta.  2.  Intact Amplatzer PFO closure device.  3. Possible moderate proximal LAD stenosis. Will send for FFR to assess for hemodynamic significance.  4.  Mildly dilated aortic root.  5. Coronary artery calcium score  374 Agatston units. This places the patient in the 72nd percentile for age and gender, suggesting intermediate risk for future cardiac events.  EKG:  EKG is not ordered today.    Recent Labs: 08/04/2020: BUN 18; Creatinine, Ser 1.10; Potassium 3.8; Sodium 140  Recent Lipid Panel    Component Value Date/Time   CHOL 206 (H) 10/27/2015 0317   TRIG 119 10/27/2015 0317   HDL 40 (L) 10/27/2015 0317   CHOLHDL 5.2 10/27/2015 0317   VLDL 24 10/27/2015 0317   LDLCALC 142 (H) 10/27/2015 0317     Risk Assessment/Calculations:       Physical Exam:    VS:  BP 122/80   Pulse (!) 50   Ht 6\' 2"  (1.88 m)   Wt 196 lb 3.2 oz (89 kg)   SpO2 97%   BMI 25.19 kg/m     Wt Readings from Last 3 Encounters:  11/24/20 196 lb 3.2 oz (89 kg)  10/18/20 190 lb 4.1 oz (86.3 kg)  08/07/20 190 lb 4.1 oz (86.3 kg)     GEN:  Well nourished, well developed in no acute distress HEENT: Normal NECK: No JVD; No carotid bruits LYMPHATICS: No lymphadenopathy CARDIAC: Bradycardic and regular, no murmurs, rubs, gallops RESPIRATORY:  Clear to auscultation without  rales, wheezing or rhonchi  ABDOMEN: Soft, non-tender, non-distended MUSCULOSKELETAL:  No edema; No deformity  SKIN: Warm and dry NEUROLOGIC:  Alert and oriented x 3 PSYCHIATRIC:  Normal affect   ASSESSMENT:    1. Atrial tachycardia (HCC)   2. Paroxysmal atrial fibrillation (HCC)   3. Coronary artery disease involving native coronary artery of native heart without angina pectoris   4. PFO (patent foramen ovale)   5. Essential hypertension   6. Mixed hyperlipidemia   7. Aortic root dilatation (HCC)    PLAN:    In order of problems listed above:  1. Reviewed Dr. 08/09/20 evaluation.  Continue beta-blocker.  No serious arrhythmias identified. 2. No recurrence now in the past few years.  Patient off of anticoagulation. 3. Identified on CT coronary angiography.  Continue risk reduction measures with a statin drug, beta-blocker, and low-dose aspirin. 4. Status post transcatheter closure with no residual shunt. 5. Lengthy discussion about blood pressure.  He has a prescription for amlodipine but will not start it unless he sees more frequent readings greater than 140/80.  He will reach out if he has questions.  I asked him not to take more than 25 mg of metoprolol succinate because of bradycardia. 6. Treated with atorvastatin.  Lipids have been followed by his primary care physician. 7. Recommend repeat echo next year prior to his office visit.  Most recent studies reviewed.  Medication Adjustments/Labs and Tests Ordered: Current medicines are reviewed at length with the patient today.  Concerns regarding medicines are outlined above.  Orders Placed This Encounter  Procedures  . ECHOCARDIOGRAM COMPLETE   No orders of the defined types were placed in this encounter.   Patient Instructions  Medication Instructions:  Your provider recommends that you continue on your current medications as directed. Please refer to the Current Medication list given to you today.   *If you need a  refill on your cardiac medications before your next appointment, please call your pharmacy*   Follow-Up: You will be called to arrange your 1 year echo and office visit.    Signed, Lovena Neighbours, MD  11/24/2020 8:39 AM    [facility]

## 2020-11-24 NOTE — Addendum Note (Signed)
Addended by: Lenn Sink on: 11/24/2020 02:18 PM   Modules accepted: Orders

## 2020-11-24 NOTE — Patient Instructions (Signed)
Medication Instructions:  Your provider recommends that you continue on your current medications as directed. Please refer to the Current Medication list given to you today.   *If you need a refill on your cardiac medications before your next appointment, please call your pharmacy*   Follow-Up: You will be called to arrange your 1 year echo and office visit. 

## 2020-11-25 DIAGNOSIS — M2041 Other hammer toe(s) (acquired), right foot: Secondary | ICD-10-CM | POA: Diagnosis not present

## 2020-12-01 ENCOUNTER — Encounter: Payer: Medicare Other | Admitting: Podiatry

## 2020-12-03 ENCOUNTER — Other Ambulatory Visit: Payer: Self-pay

## 2020-12-03 ENCOUNTER — Ambulatory Visit (INDEPENDENT_AMBULATORY_CARE_PROVIDER_SITE_OTHER): Payer: Medicare Other

## 2020-12-03 ENCOUNTER — Encounter: Payer: Self-pay | Admitting: Podiatry

## 2020-12-03 ENCOUNTER — Ambulatory Visit (INDEPENDENT_AMBULATORY_CARE_PROVIDER_SITE_OTHER): Payer: Medicare Other | Admitting: Podiatry

## 2020-12-03 DIAGNOSIS — M2041 Other hammer toe(s) (acquired), right foot: Secondary | ICD-10-CM

## 2020-12-03 NOTE — Progress Notes (Signed)
Subjective:   Patient ID: Fred Russell, male   DOB: 70 y.o.   MRN: [id number]   HPI Patient states he is doing well with surgery and very pleased and did have pain the first 2 days   ROS      Objective:  Physical Exam  Neurovascular status intact negative Denna Haggard' sign motor wound edges well coapted second third fourth toes right good alignment congruent      Assessment:  Overall doing well post digital fusion digits 2 3 and arthroplasty digit 4 right     Plan:  Reviewed condition reviewed x-ray and sterile dressing applied with instructions on keeping the toes plantarflexed.  Reappoint 2 weeks for suture removal earlier if needed  X-rays indicate pins are in place second and third toes right foot good alignment fourth toe

## 2020-12-17 ENCOUNTER — Ambulatory Visit: Payer: Medicare Other

## 2020-12-17 ENCOUNTER — Ambulatory Visit (INDEPENDENT_AMBULATORY_CARE_PROVIDER_SITE_OTHER): Payer: Medicare Other | Admitting: Podiatry

## 2020-12-17 ENCOUNTER — Ambulatory Visit (INDEPENDENT_AMBULATORY_CARE_PROVIDER_SITE_OTHER): Payer: Medicare Other

## 2020-12-17 ENCOUNTER — Encounter: Payer: Self-pay | Admitting: Podiatry

## 2020-12-17 ENCOUNTER — Other Ambulatory Visit: Payer: Self-pay

## 2020-12-17 DIAGNOSIS — M2041 Other hammer toe(s) (acquired), right foot: Secondary | ICD-10-CM

## 2020-12-17 NOTE — Progress Notes (Signed)
Subjective:   Patient ID: Fred Russell, male   DOB: 70 y.o.   MRN: [id number]   HPI Patient states he is doing excellent with surgery and very pleased   ROS      Objective:  Physical Exam  Neurovascular status intact negative Denna Haggard' sign noted patient's digits healing well wound edges well coapted stitches in place pins in place      Assessment:  Doing well post digital procedures and pin application     Plan:  H&P reviewed condition and remove stitches wound edges coapted well instructed to continue to keep the toes plantarflexed reappoint 2 weeks pin removal earlier if needed  X-rays indicate the alignment looks good pins are in place still doing the job that we are hoping for as far as alignment goes

## 2020-12-31 ENCOUNTER — Ambulatory Visit (INDEPENDENT_AMBULATORY_CARE_PROVIDER_SITE_OTHER): Payer: Medicare Other | Admitting: Podiatry

## 2020-12-31 ENCOUNTER — Encounter: Payer: Self-pay | Admitting: Podiatry

## 2020-12-31 ENCOUNTER — Ambulatory Visit (INDEPENDENT_AMBULATORY_CARE_PROVIDER_SITE_OTHER): Payer: Medicare Other

## 2020-12-31 ENCOUNTER — Other Ambulatory Visit: Payer: Self-pay

## 2020-12-31 DIAGNOSIS — M2041 Other hammer toe(s) (acquired), right foot: Secondary | ICD-10-CM | POA: Diagnosis not present

## 2020-12-31 NOTE — Progress Notes (Signed)
Subjective:   Patient ID: Fred Russell, male   DOB: 70 y.o.   MRN: [id number]   HPI Patient states doing very well with surgery very pleased   ROS      Objective:  Physical Exam  Neurovascular status intact negative Denna Haggard' sign noted pin in place digits 2 3 right toes in good alignment      Assessment:  Doing well fusion digits 2 3 right     Plan:  Pins removed sterile dressings applied x-rays reviewed and patient may return to normal shoe gear activity as he tolerates.  Overall did extremely well  X-rays indicate good positional component of the digits with healing finishing but doing well

## 2021-06-10 ENCOUNTER — Telehealth: Payer: Self-pay

## 2021-06-10 DIAGNOSIS — I480 Paroxysmal atrial fibrillation: Secondary | ICD-10-CM

## 2021-06-10 DIAGNOSIS — I77810 Thoracic aortic ectasia: Secondary | ICD-10-CM

## 2021-06-10 NOTE — Telephone Encounter (Signed)
Scheduled the patient for 1 year echo and office visit with Dr. Excell Seltzer. Echo 12/01/2021 and follow-up visit 12/04/21. The patient was grateful for call and agrees with plan.

## 2021-07-02 ENCOUNTER — Other Ambulatory Visit: Payer: Self-pay | Admitting: Cardiovascular Disease

## 2021-07-02 DIAGNOSIS — I480 Paroxysmal atrial fibrillation: Secondary | ICD-10-CM

## 2021-10-02 ENCOUNTER — Other Ambulatory Visit: Payer: Self-pay | Admitting: Cardiovascular Disease

## 2021-10-02 DIAGNOSIS — I480 Paroxysmal atrial fibrillation: Secondary | ICD-10-CM

## 2021-11-03 ENCOUNTER — Other Ambulatory Visit (HOSPITAL_COMMUNITY): Payer: Self-pay | Admitting: Family Medicine

## 2021-11-03 ENCOUNTER — Other Ambulatory Visit: Payer: Self-pay

## 2021-11-03 ENCOUNTER — Ambulatory Visit (HOSPITAL_COMMUNITY)
Admission: RE | Admit: 2021-11-03 | Discharge: 2021-11-03 | Disposition: A | Discharge status: Home / Self Care | Payer: Medicare Other | Source: Ambulatory Visit | Attending: Family Medicine | Admitting: Family Medicine

## 2021-11-03 DIAGNOSIS — R062 Wheezing: Secondary | ICD-10-CM

## 2021-12-01 ENCOUNTER — Ambulatory Visit (HOSPITAL_COMMUNITY): Payer: Medicare Other | Attending: Cardiovascular Disease

## 2021-12-01 ENCOUNTER — Other Ambulatory Visit: Payer: Self-pay

## 2021-12-01 DIAGNOSIS — I480 Paroxysmal atrial fibrillation: Secondary | ICD-10-CM | POA: Diagnosis present

## 2021-12-01 DIAGNOSIS — I77810 Thoracic aortic ectasia: Secondary | ICD-10-CM | POA: Diagnosis present

## 2021-12-01 LAB — ECHOCARDIOGRAM COMPLETE
Area-P 1/2: 2.71 cm2
P 1/2 time: 788 msec
S' Lateral: 3.3 cm

## 2021-12-04 ENCOUNTER — Ambulatory Visit: Payer: Medicare Other | Admitting: Cardiovascular Disease

## 2021-12-23 ENCOUNTER — Other Ambulatory Visit: Payer: Self-pay

## 2021-12-23 ENCOUNTER — Ambulatory Visit (INDEPENDENT_AMBULATORY_CARE_PROVIDER_SITE_OTHER): Payer: Medicare Other | Admitting: Cardiovascular Disease

## 2021-12-23 ENCOUNTER — Encounter: Payer: Self-pay | Admitting: Cardiovascular Disease

## 2021-12-23 VITALS — BP 150/91 | HR 53 | Ht 74.0 in | Wt 194.2 lb

## 2021-12-23 DIAGNOSIS — I77810 Thoracic aortic ectasia: Secondary | ICD-10-CM | POA: Diagnosis not present

## 2021-12-23 DIAGNOSIS — I2510 Atherosclerotic heart disease of native coronary artery without angina pectoris: Secondary | ICD-10-CM | POA: Diagnosis not present

## 2021-12-23 DIAGNOSIS — I10 Essential (primary) hypertension: Secondary | ICD-10-CM | POA: Diagnosis not present

## 2021-12-23 DIAGNOSIS — Q2112 Patent foramen ovale: Secondary | ICD-10-CM

## 2021-12-23 DIAGNOSIS — I471 Supraventricular tachycardia: Secondary | ICD-10-CM

## 2021-12-23 DIAGNOSIS — R0602 Shortness of breath: Secondary | ICD-10-CM

## 2021-12-23 DIAGNOSIS — E782 Mixed hyperlipidemia: Secondary | ICD-10-CM

## 2021-12-23 NOTE — Patient Instructions (Addendum)
Medication Instructions:  Your physician recommends that you continue on your current medications as directed. Please refer to the Current Medication list given to you today.  *If you need a refill on your cardiac medications before your next appointment, please call your pharmacy*   Lab Work: CBC, CMET, Lipids If you have labs (blood work) drawn today and your tests are completely normal, you will receive your results only by: [place] (if you have MyChart) OR A paper copy in the mail If you have any lab test that is abnormal or we need to change your treatment, we will call you to review the results.   Testing/Procedures: Coronary CT Your physician has requested that you have cardiac CT. Cardiac computed tomography (CT) is a painless test that uses an x-ray machine to take clear, detailed pictures of your heart. For further information please visit [email or web address]. Please follow instruction sheet as given.     Follow-Up: At [facility], you and your health needs are our priority.  As part of our continuing mission to provide you with exceptional heart care, we have created designated Provider Care Teams.  These Care Teams include your primary Cardiologist (physician) and Advanced Practice Providers (APPs -  Physician Assistants and Nurse Practitioners) who all work together to provide you with the care you need, when you need it.   Your next appointment:   1 year(s)  The format for your next appointment:   In Person  Provider:   Sherren Mocha, MD     Other Instructions   Your cardiac CT will be scheduled at:   [facility] [address] [place], [place] [address] [phone]  Please arrive at the [facility] main entrance (entrance A) of [facility] 30 minutes prior to test start time. You can use the FREE valet parking offered at the main entrance (encouraged to control the heart rate for the test) Proceed to the [facility]  Radiology Department (first floor) to check-in and test prep.  Please follow these instructions carefully (unless otherwise directed):  Hold all erectile dysfunction medications at least 3 days (72 hrs) prior to test.  On the Night Before the Test: Be sure to Drink plenty of water. Do not consume any caffeinated/decaffeinated beverages or chocolate 12 hours prior to your test. Do not take any antihistamines 12 hours prior to your test. On the Day of the Test: Drink plenty of water until 1 hour prior to the test. Do not eat any food 4 hours prior to the test. You may take your regular medications prior to the test.  Take metoprolol (Lopressor) two hours prior to test.  After the Test: Drink plenty of water. After receiving IV contrast, you may experience a mild flushed feeling. This is normal. On occasion, you may experience a mild rash up to 24 hours after the test. This is not dangerous. If this occurs, you can take Benadryl 25 mg and increase your fluid intake. If you experience trouble breathing, this can be serious. If it is severe call 911 IMMEDIATELY. If it is mild, please call our office. If you take any of these medications: Glipizide/Metformin, Avandament, Glucavance, please do not take 48 hours after completing test unless otherwise instructed.  We will call to schedule your test 2-4 weeks out understanding that some insurance companies will need an authorization prior to the service being performed.   For non-scheduling related questions, please contact the cardiac imaging nurse navigator should you have any questions/concerns: Clarise Cruz  Juleen China, Cardiac Imaging Nurse Navigator Gordy Clement, Cardiac Imaging Nurse Navigator [facility] Direct Office Dial: [phone]   For scheduling needs, including cancellations and rescheduling, please call [place], [phone].   Thank you for allowing [place] at [facility] to serve you, God Bless!!

## 2021-12-23 NOTE — Progress Notes (Signed)
Cardiology Office Note:    Date:  12/23/2021   ID:  Tysen, Olivia September 28, 1951, MRN [id number]  PCP:  Lemmie Evens, MD   [facility] Providers Cardiologist:  Sherren Mocha, MD     Referring MD: Lemmie Evens, MD   Chief Complaint  Patient presents with   Coronary Artery Disease    History of Present Illness:    Fred Russell is a 71 y.o. male with a hx of cryptogenic stroke, PFO status post transcatheter closure in 2016, paroxysmal atrial fibrillation occurring after PFO closure and detected on a loop recorder, atrial tachycardia, and aortic root dilatation.  He is here alone today. Exercising regularly with mild exertional dyspnea, but feels this may be age-related.  No orthopnea or PND.  He has been worried about vigorous exercise because of his diagnosis of coronary artery disease with moderate LAD stenosis identified on prior coronary CTA.  He also complains of some postural dizziness with changing positions.  He has not had syncope or presyncope.  Reports at home blood pressures have been in good range of about 130/80 mmHg on average.  No chest pain or pressure.  No heart palpitations over the past year.  Overall feels like he is doing pretty well.  Past Medical History:  Diagnosis Date   Arthritis    frozen shoulder   Cryptogenic stroke ([place])    a. 10/2015-->TEE showed PFO w/ R->L Shunt, s/p closure;  b. s/p MDT Linq.   Essential hypertension    Left foot drop 08/13/2015   PFO (patent foramen ovale)    a. 10/2015 s/p closure w/ 25 mm Amplatzer PFO occluder.   Right shoulder pain     Past Surgical History:  Procedure Laterality Date   BICEPT TENODESIS Right 08/07/2020   Procedure: BICEPS TENODESIS;  Surgeon: Hiram Gash, MD;  Location: [facility];  Service: Orthopedics;  Laterality: Right;   CARDIAC CATHETERIZATION N/A 11/14/2015   Procedure: PFO Closure;  Surgeon: Sherren Mocha, MD;  Location: [place] [facility];  Service: Cardiovascular;   Laterality: N/A;   EP IMPLANTABLE DEVICE N/A 10/28/2015   Procedure: Loop Recorder Insertion;  Surgeon: Thompson Grayer, MD;  Location: [place] [facility];  Service: Cardiovascular;  Laterality: N/A;   Implantable loop recorder removal  10/02/2018   MDT Linq removed by Dr Rayann Heman in office   INGUINAL HERNIA REPAIR Right 09/25/2018   Procedure: OPEN RIGHT INGUINAL HERNIA REPAIR WITH INSERTION OF MESH;  Surgeon: Judeth Horn, MD;  Location: [place];  Service: General;  Laterality: Right;   ROTATOR CUFF REPAIR     x3   SHOULDER ARTHROSCOPY Right    x3   SHOULDER ARTHROSCOPY WITH ROTATOR CUFF REPAIR Right 08/07/2020   Procedure: RIGHT SHOULDER ARTHROSCOPY DEBRIDEMENT WITH ROTATOR CUFF REPAIR AND OPEN BICEPS TENODESIS;  Surgeon: Hiram Gash, MD;  Location: [place];  Service: Orthopedics;  Laterality: Right;   TEE WITHOUT CARDIOVERSION N/A 10/28/2015   Procedure: TRANSESOPHAGEAL ECHOCARDIOGRAM (TEE);  Surgeon: Larey Dresser, MD;  Location: [facility];  Service: Cardiovascular;  Laterality: N/A;    Current Medications: Current Meds  Medication Sig   aspirin EC 81 MG tablet Take 81 mg by mouth daily. Swallow whole.   atorvastatin (LIPITOR) 40 MG tablet TAKE 1 TABLET BY MOUTH EACH DAY FOR HIGH CHOLESTEROL.   hydrochlorothiazide (HYDRODIURIL) 25 MG tablet Take 25 mg by mouth daily.   lisinopril (PRINIVIL,ZESTRIL) 20 MG tablet Take 20 mg 2 (two) times daily  by mouth.   metoprolol succinate (TOPROL-XL) 25 MG 24 hr tablet TAKE 1 TABLET ONCE DAILY FOR HIGH BLOOD PRESSURE.     Allergies:   Patient has no known allergies.   Social History   Socioeconomic History   Marital status: Married    Spouse name: Not on file   Number of children: 2   Years of education: DDS   Highest education level: Not on file  Occupational History   Occupation: retired  Tobacco Use   Smoking status: Never   Smokeless tobacco: Never  Substance and Sexual Activity   Alcohol use:  No   Drug use: No   Sexual activity: Not on file  Other Topics Concern   Not on file  Social History Narrative   Patient drinks 2 cups of coffee daily.   Patient is right handed.    Social Determinants of Health   Financial Resource Strain: Not on file  Food Insecurity: Not on file  Transportation Needs: Not on file  Physical Activity: Not on file  Stress: Not on file  Social Connections: Not on file     Family History: The patient's family history includes ALS in his father; COPD in his mother; Heart failure in his mother; Skin cancer in his mother.  ROS:   Please see the history of present illness.    All other systems reviewed and are negative.  EKGs/Labs/Other Studies Reviewed:    The following studies were reviewed today: Echo 12/01/2021: 1. Left ventricular ejection fraction, by estimation, is 60 to 65%. Left  ventricular ejection fraction by 3D volume is 58 %. The left ventricle has  normal function. The left ventricle has no regional wall motion  abnormalities. Left ventricular diastolic   parameters are consistent with Grade I diastolic dysfunction (impaired  relaxation). The average left ventricular global longitudinal strain is  -17.7 %. The global longitudinal strain is normal.   2. Right ventricular systolic function is normal. The right ventricular  size is normal. There is normal pulmonary artery systolic pressure.   3. Amplatzer atrial septal occluder device is well seated, without  visible shunt.   4. The mitral valve is myxomatous. No evidence of mitral valve  regurgitation. No evidence of mitral stenosis.   5. The aortic valve is tricuspid. Aortic valve regurgitation is mild. No  aortic stenosis is present.   6. Aortic dilatation noted. There is moderate dilatation of the aortic  root, measuring 47 mm. There is moderate dilatation of the ascending  aorta, measuring 45 mm.   7. The inferior vena cava is normal in size with greater than 50%   respiratory variability, suggesting right atrial pressure of 3 mmHg.   Comparison(s): Prior images unable to be directly viewed, comparison made  by report only. Aortic root diameter is larger (previously in 2021,  measured 70mm). This would be assessed more accurately by CT or MRI.   EKG:  EKG is ordered today.  The ekg ordered today demonstrates sinus bradycardia 53 bpm, otherwise within normal limits2  Recent Labs: No results found for requested labs within last 8760 hours.  Recent Lipid Panel    Component Value Date/Time   CHOL 206 (H) 10/27/2015 0317   TRIG 119 10/27/2015 0317   HDL 40 (L) 10/27/2015 0317   CHOLHDL 5.2 10/27/2015 0317   VLDL 24 10/27/2015 0317   LDLCALC 142 (H) 10/27/2015 0317     Risk Assessment/Calculations:           Physical Exam:  VS:  BP (!) 150/91    Pulse (!) 53    Ht 6\' 2"  (1.88 m)    Wt 194 lb 3.2 oz (88.1 kg)    SpO2 96%    BMI 24.93 kg/m     Wt Readings from Last 3 Encounters:  12/23/21 194 lb 3.2 oz (88.1 kg)  11/24/20 196 lb 3.2 oz (89 kg)  10/18/20 190 lb 4.1 oz (86.3 kg)     GEN:  Well nourished, well developed in no acute distress HEENT: Normal NECK: No JVD; No carotid bruits LYMPHATICS: No lymphadenopathy CARDIAC: RRR, no murmurs, rubs, gallops RESPIRATORY:  Clear to auscultation without rales, wheezing or rhonchi  ABDOMEN: Soft, non-tender, non-distended MUSCULOSKELETAL:  No edema; No deformity  SKIN: Warm and dry NEUROLOGIC:  Alert and oriented x 3 PSYCHIATRIC:  Normal affect   ASSESSMENT:    1. PFO (patent foramen ovale)   2. Essential hypertension   3. Coronary artery disease involving native coronary artery of native heart without angina pectoris   4. Aortic root dilatation (HCC)   5. Atrial tachycardia ([place])   6. Mixed hyperlipidemia   7. Shortness of breath    PLAN:    In order of problems listed above:  Status post transcatheter closure with no residual shunt.  PFO closure device has been imaged on  serial echo studies.  No recurrent stroke/TIA events since closure in 2016. Home blood pressure readings average 130/80 based on his report.  Sometimes has readings in the 120s over 70s.  It sounds like his blood pressure control at home is reasonably good and will continue his same medications. The patient has developed mild exercise intolerance with exertional dyspnea.  He is concerned about overexerting in his cardiac risk.  He was noted to have moderate LAD stenosis at the time of his last coronary CTA study in 2000.  I have recommended repeating this study to evaluate for obstructive CAD. Gated coronary CTA will help evaluate aortic root dilatation.  Recent echo suggested increased size of his aortic sinuses and ascending aorta, but cross-sectional imaging is important to more accurately measure his aortic dimensions. No recurrent palpitations over the past few years.  Doing very well in this regard. Treated with atorvastatin 40 mg daily.  Needs updated lipids and LFTs.  Follows a healthy diet. Check coronary CTA as above.  Clear lung fields on exam with no evidence of pulmonary disease.   Medication Adjustments/Labs and Tests Ordered: Current medicines are reviewed at length with the patient today.  Concerns regarding medicines are outlined above.  Orders Placed This Encounter  Procedures   CT CORONARY MORPH W/CTA COR W/SCORE W/CA W/CM &/OR WO/CM   CBC   Comprehensive metabolic panel   Lipid panel   EKG 12-Lead   No orders of the defined types were placed in this encounter.   Patient Instructions  Medication Instructions:  Your physician recommends that you continue on your current medications as directed. Please refer to the Current Medication list given to you today.  *If you need a refill on your cardiac medications before your next appointment, please call your pharmacy*   Lab Work: CBC, CMET, Lipids If you have labs (blood work) drawn today and your tests are completely  normal, you will receive your results only by: [place] (if you have MyChart) OR A paper copy in the mail If you have any lab test that is abnormal or we need to change your treatment, we will call you to review the  results.   Testing/Procedures: Coronary CT Your physician has requested that you have cardiac CT. Cardiac computed tomography (CT) is a painless test that uses an x-ray machine to take clear, detailed pictures of your heart. For further information please visit [email or web address]. Please follow instruction sheet as given.     Follow-Up: At [facility], you and your health needs are our priority.  As part of our continuing mission to provide you with exceptional heart care, we have created designated Provider Care Teams.  These Care Teams include your primary Cardiologist (physician) and Advanced Practice Providers (APPs -  Physician Assistants and Nurse Practitioners) who all work together to provide you with the care you need, when you need it.   Your next appointment:   1 year(s)  The format for your next appointment:   In Person  Provider:   Sherren Mocha, MD     Other Instructions   Your cardiac CT will be scheduled at:   [facility] [address] [place], [place] [address] [phone]  Please arrive at the [facility] main entrance (entrance A) of [facility] 30 minutes prior to test start time. You can use the FREE valet parking offered at the main entrance (encouraged to control the heart rate for the test) Proceed to the [facility] Radiology Department (first floor) to check-in and test prep.  Please follow these instructions carefully (unless otherwise directed):  Hold all erectile dysfunction medications at least 3 days (72 hrs) prior to test.  On the Night Before the Test: Be sure to Drink plenty of water. Do not consume any caffeinated/decaffeinated beverages or chocolate 12 hours prior to your test. Do not  take any antihistamines 12 hours prior to your test. On the Day of the Test: Drink plenty of water until 1 hour prior to the test. Do not eat any food 4 hours prior to the test. You may take your regular medications prior to the test.  Take metoprolol (Lopressor) two hours prior to test.  After the Test: Drink plenty of water. After receiving IV contrast, you may experience a mild flushed feeling. This is normal. On occasion, you may experience a mild rash up to 24 hours after the test. This is not dangerous. If this occurs, you can take Benadryl 25 mg and increase your fluid intake. If you experience trouble breathing, this can be serious. If it is severe call 911 IMMEDIATELY. If it is mild, please call our office. If you take any of these medications: Glipizide/Metformin, Avandament, Glucavance, please do not take 48 hours after completing test unless otherwise instructed.  We will call to schedule your test 2-4 weeks out understanding that some insurance companies will need an authorization prior to the service being performed.   For non-scheduling related questions, please contact the cardiac imaging nurse navigator should you have any questions/concerns: Marchia Bond, Cardiac Imaging Nurse Navigator Gordy Clement, Cardiac Imaging Nurse Navigator [facility] Direct Office Dial: [phone]   For scheduling needs, including cancellations and rescheduling, please call [place], [phone].   Thank you for allowing [place] [facility] to serve you, God Bless!!    Signed, Sherren Mocha, MD  12/23/2021 5:02 PM    [facility]

## 2021-12-25 ENCOUNTER — Other Ambulatory Visit: Payer: Self-pay

## 2021-12-25 ENCOUNTER — Other Ambulatory Visit: Payer: Medicare Other | Admitting: *Deleted

## 2021-12-25 DIAGNOSIS — Q2112 Patent foramen ovale: Secondary | ICD-10-CM

## 2021-12-25 DIAGNOSIS — I2510 Atherosclerotic heart disease of native coronary artery without angina pectoris: Secondary | ICD-10-CM

## 2021-12-25 DIAGNOSIS — I77810 Thoracic aortic ectasia: Secondary | ICD-10-CM

## 2021-12-25 DIAGNOSIS — I10 Essential (primary) hypertension: Secondary | ICD-10-CM

## 2021-12-25 DIAGNOSIS — R0602 Shortness of breath: Secondary | ICD-10-CM

## 2021-12-25 DIAGNOSIS — E782 Mixed hyperlipidemia: Secondary | ICD-10-CM

## 2021-12-25 DIAGNOSIS — I471 Supraventricular tachycardia: Secondary | ICD-10-CM

## 2021-12-25 LAB — COMPREHENSIVE METABOLIC PANEL
ALT: 20 IU/L (ref 0–44)
AST: 19 IU/L (ref 0–40)
Albumin/Globulin Ratio: 2.1 (ref 1.2–2.2)
Albumin: 4.2 g/dL (ref 3.8–4.8)
Alkaline Phosphatase: 46 IU/L (ref 44–121)
BUN/Creatinine Ratio: 14 (ref 10–24)
BUN: 15 mg/dL (ref 8–27)
Bilirubin Total: 1.4 mg/dL — ABNORMAL HIGH (ref 0.0–1.2)
CO2: 26 mmol/L (ref 20–29)
Calcium: 9.4 mg/dL (ref 8.6–10.2)
Chloride: 101 mmol/L (ref 96–106)
Creatinine, Ser: 1.04 mg/dL (ref 0.76–1.27)
Globulin, Total: 2 g/dL (ref 1.5–4.5)
Glucose: 104 mg/dL — ABNORMAL HIGH (ref 70–99)
Potassium: 3.8 mmol/L (ref 3.5–5.2)
Sodium: 140 mmol/L (ref 134–144)
Total Protein: 6.2 g/dL (ref 6.0–8.5)
eGFR: 77 mL/min/{1.73_m2} (ref >59)

## 2021-12-25 LAB — LIPID PANEL
Chol/HDL Ratio: 2.9 ratio (ref 0.0–5.0)
Cholesterol, Total: 114 mg/dL (ref 100–199)
HDL: 40 mg/dL
LDL Chol Calc (NIH): 60 mg/dL (ref 0–99)
Triglycerides: 68 mg/dL (ref 0–149)
VLDL Cholesterol Cal: 14 mg/dL (ref 5–40)

## 2021-12-25 LAB — CBC
Hematocrit: 42 % (ref 37.5–51.0)
Hemoglobin: 14.3 g/dL (ref 13.0–17.7)
MCH: 31.8 pg (ref 26.6–33.0)
MCHC: 34 g/dL (ref 31.5–35.7)
MCV: 94 fL (ref 79–97)
Platelets: 194 x10E3/uL (ref 150–450)
RBC: 4.49 x10E6/uL (ref 4.14–5.80)
RDW: 13.4 % (ref 11.6–15.4)
WBC: 5 x10E3/uL (ref 3.4–10.8)

## 2021-12-28 ENCOUNTER — Ambulatory Visit: Payer: Medicare Other | Admitting: Cardiovascular Disease

## 2021-12-29 ENCOUNTER — Other Ambulatory Visit: Payer: Self-pay | Admitting: Cardiovascular Disease

## 2021-12-29 DIAGNOSIS — I480 Paroxysmal atrial fibrillation: Secondary | ICD-10-CM

## 2022-01-04 ENCOUNTER — Telehealth (HOSPITAL_COMMUNITY): Payer: Self-pay | Admitting: *Deleted

## 2022-01-04 NOTE — Telephone Encounter (Signed)
Patient calling regarding upcoming cardiac imaging study; pt verbalizes understanding of appt date/time, parking situation and where to check in, pre-test NPO status and medications ordered, and verified current allergies; name and call back number provided for further questions should they arise  Larey Brick RN Navigator Cardiac Imaging [facility] [phone] office [phone] cell  He is aware to arrive at 7:15am for his 7:45am scan.

## 2022-01-05 ENCOUNTER — Ambulatory Visit (HOSPITAL_COMMUNITY)
Admission: RE | Admit: 2022-01-05 | Discharge: 2022-01-05 | Disposition: A | Discharge status: Home / Self Care | Payer: Medicare Other | Source: Ambulatory Visit | Attending: Cardiovascular Disease | Admitting: Cardiovascular Disease

## 2022-01-05 ENCOUNTER — Other Ambulatory Visit: Payer: Self-pay

## 2022-01-05 ENCOUNTER — Other Ambulatory Visit: Payer: Self-pay | Admitting: Cardiovascular Disease

## 2022-01-05 DIAGNOSIS — R931 Abnormal findings on diagnostic imaging of heart and coronary circulation: Secondary | ICD-10-CM

## 2022-01-05 DIAGNOSIS — R0602 Shortness of breath: Secondary | ICD-10-CM

## 2022-01-05 DIAGNOSIS — I4719 Other supraventricular tachycardia: Secondary | ICD-10-CM

## 2022-01-05 DIAGNOSIS — R072 Precordial pain: Secondary | ICD-10-CM

## 2022-01-05 DIAGNOSIS — I10 Essential (primary) hypertension: Secondary | ICD-10-CM

## 2022-01-05 DIAGNOSIS — I2510 Atherosclerotic heart disease of native coronary artery without angina pectoris: Secondary | ICD-10-CM

## 2022-01-05 DIAGNOSIS — Q2112 Patent foramen ovale: Secondary | ICD-10-CM | POA: Diagnosis present

## 2022-01-05 DIAGNOSIS — E782 Mixed hyperlipidemia: Secondary | ICD-10-CM | POA: Diagnosis present

## 2022-01-05 DIAGNOSIS — I77810 Thoracic aortic ectasia: Secondary | ICD-10-CM

## 2022-01-05 DIAGNOSIS — I471 Supraventricular tachycardia: Secondary | ICD-10-CM | POA: Diagnosis present

## 2022-01-05 MED ORDER — IOHEXOL 350 MG/ML SOLN
100.0000 mL | Freq: Once | INTRAVENOUS | Status: AC | PRN
  Administered 2022-01-05: 100 mL via INTRAVENOUS

## 2022-01-05 MED ORDER — NITROGLYCERIN 0.4 MG SL SUBL
SUBLINGUAL_TABLET | SUBLINGUAL | Status: AC
  Filled 2022-01-05: qty 2

## 2022-01-05 MED ORDER — NITROGLYCERIN 0.4 MG SL SUBL
0.8000 mg | SUBLINGUAL_TABLET | Freq: Once | SUBLINGUAL | Status: AC
Start: 2022-01-05 — End: 2022-01-05
  Administered 2022-01-05: 0.8 mg via SUBLINGUAL

## 2022-01-05 NOTE — Progress Notes (Signed)
CT FFR ordered.   Gerri Spore T. Flora Lipps, MD, [facility]   [facility]  [address], [address] [place], [place] [address] [phone]  10:29 AM

## 2022-01-11 ENCOUNTER — Telehealth: Payer: Self-pay | Admitting: Cardiovascular Disease

## 2022-01-11 ENCOUNTER — Ambulatory Visit: Payer: Medicare Other | Admitting: Cardiovascular Disease

## 2022-01-11 NOTE — Telephone Encounter (Signed)
Called pt to relay results of study. Pt verbalized all below complaints and severe dislike for the radiology technician that placed his IV. Pt is concerned that "poor IV placement" on the final report could mean that the results are not accurate if contrast was not able to highlight and reveal all possible/potential blockages. This RN informed pt that the study was sent for [facility], an additional read of exam, and findings were endorsed by MD. Will forward to Dr Burt Knack as Juluis Rainier and any additional thoughts. Pt appreciative.

## 2022-01-11 NOTE — Telephone Encounter (Signed)
Patient is requesting a call back to review 2/21 CT results. He states he also has a few concerns and is seeking advisement from Dr. Burt Knack. He states the RN who started his IV was completely incompetent. Patient states, per CT report, there was poor IV placement, which is known to affect contrast. He assumes his results may have been compromised due to this. He states he does not want to undergo additional radiation if he doesn't have to, but he questions whether he needs to have another CT completed for accuracy. He expressed great dissatisfaction with the RN who  started his IV. He states he has great veins and he has never experienced such an issue with any other healthcare professional. He states the entire experience with said RN was unprofessional and made him question whether blockages were able to be detected. Please advise as able.

## 2022-10-05 ENCOUNTER — Other Ambulatory Visit: Payer: Self-pay | Admitting: Cardiovascular Disease

## 2022-10-05 DIAGNOSIS — I480 Paroxysmal atrial fibrillation: Secondary | ICD-10-CM

## 2023-02-28 ENCOUNTER — Ambulatory Visit: Payer: Medicare Other | Attending: Cardiovascular Disease | Admitting: Cardiovascular Disease

## 2023-02-28 ENCOUNTER — Encounter: Payer: Self-pay | Admitting: Cardiovascular Disease

## 2023-02-28 VITALS — BP 140/100 | HR 56 | Ht 74.0 in | Wt 197.6 lb

## 2023-02-28 DIAGNOSIS — I7121 Aneurysm of the ascending aorta, without rupture: Secondary | ICD-10-CM | POA: Diagnosis present

## 2023-02-28 DIAGNOSIS — I2510 Atherosclerotic heart disease of native coronary artery without angina pectoris: Secondary | ICD-10-CM | POA: Insufficient documentation

## 2023-02-28 DIAGNOSIS — I10 Essential (primary) hypertension: Secondary | ICD-10-CM | POA: Insufficient documentation

## 2023-02-28 DIAGNOSIS — Q2112 Patent foramen ovale: Secondary | ICD-10-CM | POA: Diagnosis present

## 2023-02-28 NOTE — Progress Notes (Signed)
Cardiology Office Note:    Date:  02/28/2023   ID:  Fred Russell, Fred Russell [birth year], MRN [id number]  PCP:  Gareth Morgan, MD   [facility] Providers Cardiologist:  Tonny Bollman, MD     Referring MD: Gareth Morgan, MD   Chief Complaint  Patient presents with   Follow-up   Hypertension    History of Present Illness:    Fred Russell is a 72 y.o. male with a hx of cryptogenic stroke, PFO status post transcatheter closure in 2016, paroxysmal atrial fibrillation occurring after PFO closure and detected on a loop recorder, atrial tachycardia, and aortic root dilatation.   The patient is here alone today. He's doing pretty well. He has developed mild exercise intolerance. He still swims 4-5 days/week and takes longer breaks between laps now. He denies chest pain or pressure with exertion.  No heart palpitations, leg edema, lightheadedness, or syncope.  Past Medical History:  Diagnosis Date   Arthritis    frozen shoulder   Cryptogenic stroke    a. 10/2015-->TEE showed PFO w/ R->L Shunt, s/p closure;  b. s/p MDT Linq.   Essential hypertension    Left foot drop 08/13/2015   PFO (patent foramen ovale)    a. 10/2015 s/p closure w/ 25 mm Amplatzer PFO occluder.   Right shoulder pain     Past Surgical History:  Procedure Laterality Date   BICEPT TENODESIS Right 08/07/2020   Procedure: BICEPS TENODESIS;  Surgeon: Bjorn Pippin, MD;  Location: [facility];  Service: Orthopedics;  Laterality: Right;   CARDIAC CATHETERIZATION N/A 11/14/2015   Procedure: PFO Closure;  Surgeon: Tonny Bollman, MD;  Location: [facility] INVASIVE CV LAB;  Service: Cardiovascular;  Laterality: N/A;   EP IMPLANTABLE DEVICE N/A 10/28/2015   Procedure: Loop Recorder Insertion;  Surgeon: Hillis Range, MD;  Location: [facility] INVASIVE CV LAB;  Service: Cardiovascular;  Laterality: N/A;   Implantable loop recorder removal  10/02/2018   MDT Linq removed by Dr Johney Frame in office   INGUINAL HERNIA REPAIR  Right 09/25/2018   Procedure: OPEN RIGHT INGUINAL HERNIA REPAIR WITH INSERTION OF MESH;  Surgeon: Jimmye Norman, MD;  Location: [facility];  Service: General;  Laterality: Right;   ROTATOR CUFF REPAIR     x3   SHOULDER ARTHROSCOPY Right    x3   SHOULDER ARTHROSCOPY WITH ROTATOR CUFF REPAIR Right 08/07/2020   Procedure: RIGHT SHOULDER ARTHROSCOPY DEBRIDEMENT WITH ROTATOR CUFF REPAIR AND OPEN BICEPS TENODESIS;  Surgeon: Bjorn Pippin, MD;  Location: [facility];  Service: Orthopedics;  Laterality: Right;   TEE WITHOUT CARDIOVERSION N/A 10/28/2015   Procedure: TRANSESOPHAGEAL ECHOCARDIOGRAM (TEE);  Surgeon: Laurey Morale, MD;  Location[facility];  Service: Cardiovascular;  Laterality: N/A;    Current Medications: Current Meds  Medication Sig   aspirin EC 81 MG tablet Take 81 mg by mouth daily. Swallow whole.   atorvastatin (LIPITOR) 40 MG tablet TAKE 1 TABLET BY MOUTH EACH DAY FOR HIGH CHOLESTEROL.   hydrochlorothiazide (HYDRODIURIL) 25 MG tablet Take 25 mg by mouth daily.   lisinopril (PRINIVIL,ZESTRIL) 20 MG tablet Take 20 mg 2 (two) times daily by mouth.   metoprolol succinate (TOPROL-XL) 25 MG 24 hr tablet Take 1 tablet (25 mg total) by mouth daily.   tadalafil (CIALIS) 20 MG tablet as needed.     Allergies:   Patient has no known allergies.   Social History   Socioeconomic History   Marital status: Married    Spouse  name: Not on file   Number of children: 2   Years of education: DDS   Highest education level: Not on file  Occupational History   Occupation: retired  Tobacco Use   Smoking status: Never   Smokeless tobacco: Never  Substance and Sexual Activity   Alcohol use: No   Drug use: No   Sexual activity: Not on file  Other Topics Concern   Not on file  Social History Narrative   Patient drinks 2 cups of coffee daily.   Patient is right handed.    Social Determinants of Health   Financial Resource Strain: Not on file  Food  Insecurity: Not on file  Transportation Needs: Not on file  Physical Activity: Not on file  Stress: Not on file  Social Connections: Not on file     Family History: The patient's family history includes ALS in his father; COPD in his mother; Heart failure in his mother; Skin cancer in his mother.  ROS:   Please see the history of present illness.    All other systems reviewed and are negative.  EKGs/Labs/Other Studies Reviewed:    The following studies were reviewed today: Cardiac Studies & Procedures   CARDIAC CATHETERIZATION  CARDIAC CATHETERIZATION 11/14/2015  Narrative Successful transcatheter PFO closure using a 25 mm Amplatzer PFO occluder with no evidence of residual right to left shunt by agitated saline study at the completion of the procedure.     ECHOCARDIOGRAM  ECHOCARDIOGRAM COMPLETE 12/01/2021  Narrative ECHOCARDIOGRAM REPORT    Patient Name:   Fred Russell Date of Exam: 12/01/2021 Medical Rec #:  [id number]      Height:       74.0 in Accession #:    [id number]     Weight:       196.2 lb Date of Birth:  June 25, 1951      BSA:          2.156 m Patient Age:    70 years       BP:           163/99 mmHg Patient Gender: M              HR:           61 bpm. Exam Location:  [address]  Procedure: 2D Echo, 3D Echo, Cardiac Doppler, Color Doppler and Strain Analysis  Indications:    I77.810 Aortic root dilation  History:        Patient has prior history of Echocardiogram examinations, most recent 06/16/2020. Stroke; Risk Factors:Hypertension and Dyslipidemia. PFO s/p closure.  Sonographer:    Jorje Guild BS, RDCS Referring Phys: [id number] Ariyon Gerstenberger  IMPRESSIONS   1. Left ventricular ejection fraction, by estimation, is 60 to 65%. Left ventricular ejection fraction by 3D volume is 58 %. The left ventricle has normal function. The left ventricle has no regional wall motion abnormalities. Left ventricular diastolic parameters are consistent with Grade I  diastolic dysfunction (impaired relaxation). The average left ventricular global longitudinal strain is -17.7 %. The global longitudinal strain is normal. 2. Right ventricular systolic function is normal. The right ventricular size is normal. There is normal pulmonary artery systolic pressure. 3. Amplatzer atrial septal occluder device is well seated, without visible shunt. 4. The mitral valve is myxomatous. No evidence of mitral valve regurgitation. No evidence of mitral stenosis. 5. The aortic valve is tricuspid. Aortic valve regurgitation is mild. No aortic stenosis is present. 6. Aortic dilatation noted. There is moderate dilatation of  the aortic root, measuring 47 mm. There is moderate dilatation of the ascending aorta, measuring 45 mm. 7. The inferior vena cava is normal in size with greater than 50% respiratory variability, suggesting right atrial pressure of 3 mmHg.  Comparison(s): Prior images unable to be directly viewed, comparison made by report only. Aortic root diameter is larger (previously in 2021, measured 43mm). This would be assessed more accurately by CT or MRI.  FINDINGS Left Ventricle: Left ventricular ejection fraction, by estimation, is 60 to 65%. Left ventricular ejection fraction by 3D volume is 58 %. The left ventricle has normal function. The left ventricle has no regional wall motion abnormalities. The average left ventricular global longitudinal strain is -17.7 %. The global longitudinal strain is normal. The left ventricular internal cavity size was normal in size. There is no left ventricular hypertrophy. Left ventricular diastolic parameters are consistent with Grade I diastolic dysfunction (impaired relaxation). Normal left ventricular filling pressure.  Right Ventricle: The right ventricular size is normal. No increase in right ventricular wall thickness. Right ventricular systolic function is normal. There is normal pulmonary artery systolic pressure. The tricuspid  regurgitant velocity is 2.28 m/s, and with an assumed right atrial pressure of 3 mmHg, the estimated right ventricular systolic pressure is 23.8 mmHg.  Left Atrium: Amplatzer atrial septal occluder device is well seated, without visible shunt. Left atrial size was normal in size.  Right Atrium: Right atrial size was normal in size.  Pericardium: There is no evidence of pericardial effusion.  Mitral Valve: The mitral valve is myxomatous. There is mild thickening of the mitral valve leaflet(s). No evidence of mitral valve regurgitation. No evidence of mitral valve stenosis.  Tricuspid Valve: The tricuspid valve is normal in structure. Tricuspid valve regurgitation is mild . No evidence of tricuspid stenosis.  Aortic Valve: The aortic valve is tricuspid. Aortic valve regurgitation is mild. Aortic regurgitation PHT measures 788 msec. No aortic stenosis is present.  Pulmonic Valve: The pulmonic valve was normal in structure. Pulmonic valve regurgitation is mild. No evidence of pulmonic stenosis.  Aorta: The aortic root is normal in size and structure and aortic dilatation noted. There is moderate dilatation of the aortic root, measuring 47 mm. There is moderate dilatation of the ascending aorta, measuring 45 mm.  Venous: The inferior vena cava is normal in size with greater than 50% respiratory variability, suggesting right atrial pressure of 3 mmHg.  IAS/Shunts: No atrial level shunt detected by color flow Doppler.   LEFT VENTRICLE PLAX 2D LVIDd:         5.50 cm   Diastology LVIDs:         3.30 cm   LV e' medial:    4.70 cm/s LV PW:         0.90 cm   LV E/e' medial:  8.5 LV IVS:        0.80 cm   LV e' lateral:   4.79 cm/s LVOT diam:     2.40 cm   LV E/e' lateral: 8.4 LV SV:         106 LV SV Index:   49        2D Longitudinal Strain LVOT Area:     4.52 cm  2D Strain GLS (A2C):   -10.4 % 2D Strain GLS (A3C):   -26.5 % 2D Strain GLS (A4C):   -16.2 % 2D Strain GLS Avg:     -17.7 %  3D  Volume EF: 3D EF:  58 % LV EDV:       153 ml LV ESV:       64 ml LV SV:        89 ml  RIGHT VENTRICLE             IVC RV Basal diam:  3.90 cm     IVC diam: 1.30 cm RV S prime:     14.40 cm/s TAPSE (M-mode): 2.6 cm RVSP:           23.8 mmHg  LEFT ATRIUM             Index        RIGHT ATRIUM           Index LA diam:        3.70 cm 1.72 cm/m   RA Pressure: 3.00 mmHg LA Vol (A2C):   55.9 ml 25.93 ml/m  RA Area:     23.30 cm LA Vol (A4C):   29.8 ml 13.82 ml/m  RA Volume:   73.10 ml  33.91 ml/m LA Biplane Vol: 41.6 ml 19.30 ml/m AORTIC VALVE LVOT Vmax:   101.00 cm/s LVOT Vmean:  66.900 cm/s LVOT VTI:    0.234 m AI PHT:      788 msec  AORTA Ao Root diam: 4.70 cm Ao Asc diam:  4.50 cm  MITRAL VALVE               TRICUSPID VALVE TR Peak grad:   20.8 mmHg MV Decel Time: 280 msec    TR Vmax:        228.00 cm/s MV E velocity: 40.10 cm/s  Estimated RAP:  3.00 mmHg MV A velocity: 44.30 cm/s  RVSP:           23.8 mmHg MV E/A ratio:  0.91 SHUNTS Systemic VTI:  0.23 m Systemic Diam: 2.40 cm  Thurmon Fair MD Electronically signed by Thurmon Fair MD Signature Date/Time: 12/01/2021/2:03:31 PM    Final   TEE  ECHO TEE 10/28/2015  Narrative *[facility]* *[facility]* [address] [place], [place] [address] [phone]  ------------------------------------------------------------------- Transesophageal Echocardiography  Patient:    Nicholas, Trompeter MR #:       [id number] Study Date: 10/28/2015 Gender:     M Age:        12 Height:     185.4 cm Weight:     82.7 kg BSA:        2.07 m^2 Pt. Status: Room:       5C08C  PERFORMING   Marca Ancona, M.D. ADMITTING    Mcintyre, Estevan Ryder ATTENDING    Mcintyre, Togo SONOGRAPHER  Leta Jungling, RDCS ORDERING     Bhagat, Bhavinkumar REFERRING    Bhagat, Bhavinkumar  cc:  ------------------------------------------------------------------- LV EF: 55% -    60%  ------------------------------------------------------------------- Indications:      CVA 436.  ------------------------------------------------------------------- History:   PMH:  No prior cardiac history.  Risk factors: Hypertension.  ------------------------------------------------------------------- Study Conclusions  - Left ventricle: The cavity size was normal. Wall thickness was normal. Systolic function was normal. The estimated ejection fraction was in the range of 55% to 60%. Wall motion was normal; there were no regional wall motion abnormalities. - Aortic valve: Trileaflet. There was no stenosis. There was mild regurgitation. 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 - Mitral valve: There was mild regurgitation. - Left atrium: No evidence of thrombus in the atrial cavity or appendage. -  Right ventricle: The cavity size was normal. Systolic function was normal. - Right atrium: No evidence of thrombus in the atrial cavity or appendage. - Atrial septum: There was a moderate PFO with a markedly positive bubble study. - Tricuspid valve: Peak RV-RA gradient 20 mmHg.  Diagnostic transesophageal echocardiography.  2D and color Doppler. Birthdate:  Patient birthdate: 27-Dec-1950.  Age:  Patient is 72 yr old.  Sex:  Gender: male.    BMI: 24.1 kg/m^2.  Blood pressure: 131/71  Patient status:  Inpatient.  Study date:  Study date: 10/28/2015. Study time: 04:15 PM.  Location:  Endoscopy.  -------------------------------------------------------------------  ------------------------------------------------------------------- Left ventricle:  The cavity size was normal. Wall thickness was normal. Systolic function was normal. The estimated ejection fraction was in the range of 55% to 60%. Wall motion was normal; there were no regional wall motion  abnormalities.  ------------------------------------------------------------------- Aortic valve:   Trileaflet.  Doppler:   There was no stenosis. There was mild regurgitation.  ------------------------------------------------------------------- Aorta:  Aneurysmal aortic root measuring 4.9 cm, ascending aorta measuring 4.0 cm.  ------------------------------------------------------------------- Mitral valve:   Normal thickness leaflets .  Doppler:   There was no evidence for stenosis.   There was mild regurgitation.  ------------------------------------------------------------------- Left atrium:  The atrium was normal in size.  No evidence of thrombus in the atrial cavity or appendage.  ------------------------------------------------------------------- Atrial septum:  There was a moderate PFO with a markedly positive bubble study.  ------------------------------------------------------------------- Right ventricle:  The cavity size was normal. Systolic function was normal.  ------------------------------------------------------------------- Pulmonic valve:    Structurally normal valve.   Cusp separation was normal.  ------------------------------------------------------------------- Tricuspid valve:   Doppler:  There was trivial regurgitation. Peak RV-RA gradient 20 mmHg.  ------------------------------------------------------------------- Right atrium:  The atrium was normal in size.  No evidence of thrombus in the atrial cavity or appendage.  ------------------------------------------------------------------- Pericardium:  There was no pericardial effusion.  ------------------------------------------------------------------- Post procedure conclusions Ascending Aorta:  - Aneurysmal aortic root measuring 4.9 cm, ascending aorta measuring 4.0 cm.  ------------------------------------------------------------------- Prepared and Electronically Authenticated  by  Marca Ancona, M.D. 2016-12-13T22:32:08   MONITORS  LONG TERM MONITOR (3-14 DAYS) 07/08/2020  Narrative 1. The basic rhythm is normal sinus with an average HR of 56 bpm 2. There is no atrial fibrillation or flutter 3. PVC's occur with a 1.8% burden 4. Multiple short supraventricular runs are present 5. Few runs of NSVT (8 beats of fast VT and longer wide complex runs at 150 bpm or less)   CT SCANS  CT CORONARY MORPH W/CTA COR W/SCORE 01/05/2022  Addendum 01/05/2022 10:30 AM ADDENDUM REPORT: 01/05/2022 10:28  CLINICAL DATA:  Chest pain  EXAM: Cardiac/Coronary CTA  TECHNIQUE: A non-contrast, gated CT scan was obtained with axial slices of 3 mm through the heart for calcium scoring. Calcium scoring was performed using the Agatston method. A 100 kV prospective, gated, contrast cardiac scan was obtained. Gantry rotation speed was 250 msecs and collimation was 0.6 mm. Two sublingual nitroglycerin tablets (0.8 mg) were given. The 3D data set was reconstructed in 5% intervals of the 35-75% of the R-R cycle. Diastolic phases were analyzed on a dedicated workstation using MPR, MIP, and VRT modes. The patient received 95 cc of contrast.  FINDINGS: Image quality: Average due to poor injection rate (IV injection locked out due to poor placement).  Noise artifact is: Mild to moderate in the distal vessels.  Coronary Arteries:  Normal coronary origin.  Right dominance.  Left main: The left main is a large caliber vessel  with a normal take off from the left coronary cusp that bifurcates to form a left anterior descending artery and a left circumflex artery. There is no plaque or stenosis.  Left anterior descending artery: There is mild to moderate mixed density plaque (40-60%) in the proximal to mid LAD. The distal LAD contains mild non-calcified plaque (25-49%). The LAD gives off 1 patent diagonal branch.  Left circumflex artery: The LCX is non-dominant and patent  without plaque or stenosis. OM1 is patent. OM2 contains minimal non-calcified plaque (<25%).  Right coronary artery: The RCA is dominant with normal take off from the right coronary cusp. The proximal and mid segments are patent. The distal RCA contains a moderate non-calcified plaque (50-69%). The RCA terminates as a PDA and without evidence of plaque or stenosis.  Right Atrium: Right atrial size is dilated.  Right Ventricle: The right ventricular cavity is dilated.  Left Atrium: Left atrial size is normal in size with no left atrial appendage filling defect. IAS occluder device noted.  Left Ventricle: The ventricular cavity size is within normal limits. There are no stigmata of prior infarction. There is no abnormal filling defect.  Pulmonary arteries: Normal in size without proximal filling defect.  Pulmonary veins: Normal pulmonary venous drainage. Slit like orifice of the right lower pulmonary vein. Similar to prior.  Pericardium: Normal thickness with no significant effusion or calcium present.  Cardiac valves: The aortic valve is trileaflet without significant calcification. The mitral valve is normal structure without significant calcification.  Aorta: Aortic root is aneurysmal. NCC 42 mm. RCC 45 mm. LCC 45 mm. STJ 39 mm. Ascending aorta 40 mm.  Extra-cardiac findings: See attached radiology report for non-cardiac structures.  IMPRESSION: 1. Coronary calcium score of 488. This was 72nd percentile for age-, sex, and race-matched controls.  2. Normal coronary origin with right dominance.  3. Mild to moderate mixed density plaque in the proximal to mid LAD (40-60%).  4. Moderate non-calcified plaque (50-69%) in the distal RCA.  5. 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  6. IAS occluder present.  RECOMMENDATIONS: 1. Moderate stenosis. Consider symptom-guided anti-ischemic pharmacotherapy as well as risk factor modification per guideline directed care.  Additional analysis with CT FFR will be submitted.  Lennie Odor, MD   Electronically Signed By: Lennie Odor M.D. On: 01/05/2022 10:28  Narrative EXAM: OVER-READ INTERPRETATION  CT CHEST  The following report is an over-read performed by radiologist Dr. Trudie Reed of [facility] on 01/05/2022. This over-read does not include interpretation of cardiac or coronary anatomy or pathology. The coronary calcium score/coronary CTA interpretation by the cardiologist is attached.  COMPARISON:  Cardiac CT 06/12/2019.  FINDINGS: Atherosclerotic calcifications in the thoracic aorta. Within the visualized portions of the thorax there are no suspicious appearing pulmonary nodules or masses, there is no acute consolidative airspace disease, no pleural effusions, no pneumothorax and no lymphadenopathy. Visualized portions of the upper abdomen are unremarkable. There are no aggressive appearing lytic or blastic lesions noted in the visualized portions of the skeleton. Severe dextroscoliosis of the thoracic spine again noted.  IMPRESSION: 1.  Aortic Atherosclerosis (ICD10-I70.0).  Electronically Signed: By: Trudie Reed M.D. On: 01/05/2022 08:03   CT SCANS  CT CORONARY MORPH W/CTA COR W/SCORE 06/13/2019  Addendum 06/13/2019  8:24 AM ADDENDUM REPORT: 06/13/2019 08:21  EXAM: OVER-READ INTERPRETATION  CT CHEST  The following report is an over-read performed by radiologist Dr. Schuyler Amor [facility] on 06/13/2019. This over-read does not include interpretation of  cardiac or coronary anatomy or pathology. The coronary calcium score/coronary artery CTA interpretation by the cardiologist is attached.  COMPARISON:  06/25/16.  FINDINGS: Cardiovascular: Normal heart size. No pericardial effusion. Main pulmonary artery is prominent measuring 3.2 cm.  Mediastinum: No adenopathy or mass.  Lungs/pleura: No pleural effusion identified. No  airspace consolidation, atelectasis or pneumothorax. There is a pulmonary nodule within the right lower lobe measuring 4 mm. This is unchanged from 06/25/2016 and is compatible with a benign abnormality. No new pulmonary nodules.  Upper abdomen: No acute abnormality.  Musculoskeletal scoliosis with degenerative disc disease. No acute or suspicious abnormality.  IMPRESSION: 1. Unremarkable over-read.   Electronically Signed By: Signa Kell M.D. On: 06/13/2019 08:21  Narrative CLINICAL DATA:  Chest pain  EXAM: Cardiac CTA  MEDICATIONS: Sub lingual nitro. 4mg  x 2  TECHNIQUE: The patient was scanned on a Siemens 192 slice scanner. Gantry rotation speed was 250 msecs. Collimation was 0.6 mm. A 100 kV prospective scan was triggered in the ascending thoracic aorta at 35-75% of the R-R interval. Average HR during the scan was 60 bpm. The 3D data set was interpreted on a dedicated work station using MPR, MIP and VRT modes. A total of 80cc of contrast was used.  FINDINGS: Non-cardiac: See separate report from [facility].  There is an atrial septal closure device present (Amplatzer PFO occluder), no evidence for residual interatrial shunting noted. Normal left-sided aortic arch with descending thoracic aorta crossing more rightward than usual to just past the mid-line. The right lower pulmonary vein is in close proximity to the descending thoracic aorta and is stenosed where it joins the left atrium (the right lower pulmonary vein is slit-like when it joins the left atrium). It is not in close proximity to the Amplatzer device. The other pulmonary veins drain normally to the left atrium. Mildly dilated aortic root at sinuses of valsalva (4.3 cm) and ascending thoracic aorta (3.8 cm).  Calcium Score: 374 Agatston units.  Coronary Arteries: Right dominant with no anomalies  LM: No plaque or stenosis.  LAD system: Extensive mixed plaque in the proximal LAD at  D1. Suspect moderate (51-69%) stenosis.  Circumflex system: No plaque or stenosis.  RCA system: No plaque or stenosis.  IMPRESSION: 1. 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 It is in close proximity to the descending thoracic aorta.  2.  Intact Amplatzer PFO closure device.  3. Possible moderate proximal LAD stenosis. Will send for FFR to assess for hemodynamic significance.  4.  Mildly dilated aortic root.  5. Coronary artery calcium score 374 Agatston units. This places the patient in the 72nd percentile for age and gender, suggesting intermediate risk for future cardiac events.  [employer] account executive  Electronically Signed: By: Marca Ancona M.D. On: 06/12/2019 17:22           EKG:  EKG is ordered today.  The ekg ordered today demonstrates sinus bradycardia 56 bpm, otherwise within normal limits  Recent Labs: No results found for requested labs within last 365 days.  Recent Lipid Panel    Component Value Date/Time   CHOL 114 12/25/2021 0728   TRIG 68 12/25/2021 0728   HDL 40 12/25/2021 0728   CHOLHDL 2.9 12/25/2021 0728   CHOLHDL 5.2 10/27/2015 0317   VLDL 24 10/27/2015 0317   LDLCALC 60 12/25/2021 0728     Risk Assessment/Calculations:           Physical Exam:  VS:  BP (!) 140/100 Comment: 130/90 Right arm  Pulse (!) 56   Ht 6\' 2"  (1.88 m)   Wt 197 lb 9.6 oz (89.6 kg)   SpO2 95%   BMI 25.37 kg/m     Wt Readings from Last 3 Encounters:  02/28/23 197 lb 9.6 oz (89.6 kg)  12/23/21 194 lb 3.2 oz (88.1 kg)  11/24/20 196 lb 3.2 oz (89 kg)     GEN:  Well nourished, well developed in no acute distress HEENT: Normal NECK: No JVD; No carotid bruits LYMPHATICS: No lymphadenopathy CARDIAC: RRR, no murmurs, rubs, gallops RESPIRATORY:  Clear to auscultation without rales, wheezing or rhonchi  ABDOMEN: Soft, non-tender, non-distended MUSCULOSKELETAL:  No edema; No deformity  SKIN: Warm and  dry NEUROLOGIC:  Alert and oriented x 3 PSYCHIATRIC:  Normal affect   ASSESSMENT:    1. PFO (patent foramen ovale)   2. Coronary artery disease involving native coronary artery of native heart without angina pectoris   3. Essential hypertension   4. Aneurysm of ascending aorta without rupture    PLAN:    In order of problems listed above:  Status post transcatheter closure.  Stable device position and no residual seen on on follow-up echo imaging. Reviewed his gated coronary CTA performed last year.  He is having no anginal symptoms.  Continue aggressive medical therapy for risk reduction. Blood pressure on my recheck is well-controlled at 126/74 mmHg.  Continue current management.  Component of whitecoat hypertension noted. Reviewed his past studies with both echo and cross-sectional imaging with CTA.  He has aortic root aneurysm involving the sinuses.  Stable findings noted with maximum diameter 45 mm on most recent CTA.  Recommend follow-up echo next year.      Medication Adjustments/Labs and Tests Ordered: Current medicines are reviewed at length with the patient today.  Concerns regarding medicines are outlined above.  Orders Placed This Encounter  Procedures   CBC   Comprehensive metabolic panel   Lipid panel   TSH   EKG 12-Lead   ECHOCARDIOGRAM COMPLETE   No orders of the defined types were placed in this encounter.   Patient Instructions  Medication Instructions:  Your physician recommends that you continue on your current medications as directed. Please refer to the Current Medication list given to you today.  *If you need a refill on your cardiac medications before your next appointment, please call your pharmacy*   Lab Work: CBC, CMET, Lipids, TSH If you have labs (blood work) drawn today and your tests are completely normal, you will receive your results only by: MyChart Message (if you have MyChart) OR A paper copy in the mail If you have any lab test that  is abnormal or we need to change your treatment, we will call you to review the results.  Testing/Procedures: ECHO (prior to next visit) Your physician has requested that you have an echocardiogram. Echocardiography is a painless test that uses sound waves to create images of your heart. It provides your doctor with information about the size and shape of your heart and how well your heart's chambers and valves are working. This procedure takes approximately one hour. There are no restrictions for this procedure. Please do NOT wear cologne, perfume, aftershave, or lotions (deodorant is allowed). Please arrive 15 minutes prior to your appointment time.  Follow-Up: At [facility], you and your health needs are our priority.  As part of our continuing mission to provide you with exceptional heart care, we  have created designated Provider Care Teams.  These Care Teams include your primary Cardiologist (physician) and Advanced Practice Providers (APPs -  Physician Assistants and Nurse Practitioners) who all work together to provide you with the care you need, when you need it.  Your next appointment:   1 year(s)  Provider:   Tonny Bollman, MD        Signed, Tonny Bollman, MD  02/28/2023 5:13 PM    [facility]

## 2023-02-28 NOTE — Patient Instructions (Signed)
Medication Instructions:  Your physician recommends that you continue on your current medications as directed. Please refer to the Current Medication list given to you today.  *If you need a refill on your cardiac medications before your next appointment, please call your pharmacy*   Lab Work: CBC, CMET, Lipids, TSH If you have labs (blood work) drawn today and your tests are completely normal, you will receive your results only by: MyChart Message (if you have MyChart) OR A paper copy in the mail If you have any lab test that is abnormal or we need to change your treatment, we will call you to review the results.  Testing/Procedures: ECHO (prior to next visit) Your physician has requested that you have an echocardiogram. Echocardiography is a painless test that uses sound waves to create images of your heart. It provides your doctor with information about the size and shape of your heart and how well your heart's chambers and valves are working. This procedure takes approximately one hour. There are no restrictions for this procedure. Please do NOT wear cologne, perfume, aftershave, or lotions (deodorant is allowed). Please arrive 15 minutes prior to your appointment time.  Follow-Up: At [facility], you and your health needs are our priority.  As part of our continuing mission to provide you with exceptional heart care, we have created designated Provider Care Teams.  These Care Teams include your primary Cardiologist (physician) and Advanced Practice Providers (APPs -  Physician Assistants and Nurse Practitioners) who all work together to provide you with the care you need, when you need it.  Your next appointment:   1 year(s)  Provider:   Tonny Bollman, MD

## 2023-03-01 ENCOUNTER — Other Ambulatory Visit: Payer: Self-pay | Admitting: Cardiovascular Disease

## 2023-03-02 LAB — LAB REPORT - SCANNED: EGFR: 71

## 2023-03-03 LAB — CBC
Hematocrit: 46.5 % (ref 37.5–51.0)
Hemoglobin: 15.4 g/dL (ref 13.0–17.7)
MCH: 31.6 pg (ref 26.6–33.0)
MCHC: 33.1 g/dL (ref 31.5–35.7)
MCV: 96 fL (ref 79–97)
Platelets: 188 10*3/uL (ref 150–450)
RBC: 4.87 x10E6/uL (ref 4.14–5.80)
RDW: 13.6 % (ref 11.6–15.4)
WBC: 5.9 10*3/uL (ref 3.4–10.8)

## 2023-03-03 LAB — COMPREHENSIVE METABOLIC PANEL
ALT: 25 IU/L (ref 0–44)
AST: 23 IU/L (ref 0–40)
Albumin/Globulin Ratio: 1.8 (ref 1.2–2.2)
Albumin: 4.4 g/dL (ref 3.8–4.8)
Alkaline Phosphatase: 54 IU/L (ref 44–121)
BUN/Creatinine Ratio: 17 (ref 10–24)
BUN: 19 mg/dL (ref 8–27)
Bilirubin Total: 1.1 mg/dL (ref 0.0–1.2)
CO2: 25 mmol/L (ref 20–29)
Calcium: 10 mg/dL (ref 8.6–10.2)
Chloride: 101 mmol/L (ref 96–106)
Creatinine, Ser: 1.11 mg/dL (ref 0.76–1.27)
Globulin, Total: 2.4 g/dL (ref 1.5–4.5)
Glucose: 95 mg/dL (ref 70–99)
Potassium: 4.2 mmol/L (ref 3.5–5.2)
Sodium: 141 mmol/L (ref 134–144)
Total Protein: 6.8 g/dL (ref 6.0–8.5)
eGFR: 71 mL/min/{1.73_m2} (ref >59)

## 2023-03-03 LAB — LIPID PANEL W/O CHOL/HDL RATIO
Cholesterol, Total: 129 mg/dL (ref 100–199)
HDL: 40 mg/dL (ref >39)
LDL Chol Calc (NIH): 72 mg/dL (ref 0–99)
Triglycerides: 87 mg/dL (ref 0–149)
VLDL Cholesterol Cal: 17 mg/dL (ref 5–40)

## 2023-03-03 LAB — TSH: TSH: 1.63 u[IU]/mL (ref 0.450–4.500)

## 2023-03-03 LAB — SPECIMEN STATUS REPORT

## 2023-12-14 ENCOUNTER — Telehealth: Payer: Self-pay

## 2023-12-14 ENCOUNTER — Telehealth: Payer: Self-pay | Admitting: *Deleted

## 2023-12-14 NOTE — Telephone Encounter (Signed)
Pt has been scheduled tele preop appt 01/13/24. Med rec and consent are done

## 2023-12-14 NOTE — Telephone Encounter (Signed)
   Pre-operative Risk Assessment    Patient Name: Fred Russell  DOB: 17-Jul-1951 MRN: [id number]   Date of last office visit: 02/28/2023 Dr. Calton Dach MD Date of next office visit: NONE   Request for Surgical Clearance    Procedure:   Left thumb [facility] arthroplasty with double tendon transfer and fiber lock suspension and repair as necessary. Left thumb MCP fusion with reconstruction as necessary  Date of Surgery:  Clearance 01/27/24                                Surgeon: Dr. Casandra Doffing MD, Vic Blackbird, [facility] or Practice Name: [facility]  Phone number: [phone] Fax number: [phone]   Type of Clearance Requested:   - Medical  - Pharmacy:  Hold Aspirin     Type of Anesthesia:   Anesthesia-Block with IV sedation    Additional requests/questions:    [name]   12/14/2023, 10:59 AM

## 2023-12-14 NOTE — Telephone Encounter (Signed)
   Name: Fred Russell  DOB: [birth year]  MRN: [id number]  Primary Cardiologist: Tonny Bollman, MD  Preoperative team, please contact this patient and set up a phone call appointment for further preoperative risk assessment. Please obtain consent and complete medication review. Thank you for your help.  I confirm that guidance regarding antiplatelet and oral anticoagulation therapy has been completed and, if necessary, noted below. Patient can hold aspirin for 5-7 days prior to procedure, please resume when safe to do so from a bleeding standpoint.   I also confirmed the patient resides in the state of [name]. As per [facility] Medical Board telemedicine laws, the patient must reside in the state in which the provider is licensed.  Rip Harbour, NP 12/14/2023, 11:57 AM [facility]

## 2023-12-14 NOTE — Telephone Encounter (Signed)
Pt has been scheduled tele preop appt 01/13/24. Med rec and consent are done.      Patient Consent for Virtual Visit        Fred Russell has provided verbal consent on 12/14/2023 for a virtual visit (video or telephone).   CONSENT FOR VIRTUAL VISIT FOR:  Fred Russell  By participating in this virtual visit I agree to the following:  I hereby voluntarily request, consent and authorize [facility] and its employed or contracted physicians, physician assistants, nurse practitioners or other licensed health care professionals (the Practitioner), to provide me with telemedicine health care services (the "Services") as deemed necessary by the treating Practitioner. I acknowledge and consent to receive the Services by the Practitioner via telemedicine. I understand that the telemedicine visit will involve communicating with the Practitioner through live audiovisual communication technology and the disclosure of certain medical information by electronic transmission. I acknowledge that I have been given the opportunity to request an in-person assessment or other available alternative prior to the telemedicine visit and am voluntarily participating in the telemedicine visit.  I understand that I have the right to withhold or withdraw my consent to the use of telemedicine in the course of my care at any time, without affecting my right to future care or treatment, and that the Practitioner or I may terminate the telemedicine visit at any time. I understand that I have the right to inspect all information obtained and/or recorded in the course of the telemedicine visit and may receive copies of available information for a reasonable fee.  I understand that some of the potential risks of receiving the Services via telemedicine include:  Delay or interruption in medical evaluation due to technological equipment failure or disruption; Information transmitted may not be sufficient (e.g. poor  resolution of images) to allow for appropriate medical decision making by the Practitioner; and/or  In rare instances, security protocols could fail, causing a breach of personal health information.  Furthermore, I acknowledge that it is my responsibility to provide information about my medical history, conditions and care that is complete and accurate to the best of my ability. I acknowledge that Practitioner's advice, recommendations, and/or decision may be based on factors not within their control, such as incomplete or inaccurate data provided by me or distortions of diagnostic images or specimens that may result from electronic transmissions. I understand that the practice of medicine is not an exact science and that Practitioner makes no warranties or guarantees regarding treatment outcomes. I acknowledge that a copy of this consent can be made available to me via my patient portal [facility] MyChart), or I can request a printed copy by calling the office of [facility].    I understand that my insurance will be billed for this visit.   I have read or had this consent read to me. I understand the contents of this consent, which adequately explains the benefits and risks of the Services being provided via telemedicine.  I have been provided ample opportunity to ask questions regarding this consent and the Services and have had my questions answered to my satisfaction. I give my informed consent for the services to be provided through the use of telemedicine in my medical care

## 2024-01-07 LAB — LAB REPORT - SCANNED: EGFR: 66

## 2024-01-13 ENCOUNTER — Ambulatory Visit: Payer: Medicare Other | Attending: Internal Medicine | Admitting: Emergency Medicine

## 2024-01-13 DIAGNOSIS — Z01810 Encounter for preprocedural cardiovascular examination: Secondary | ICD-10-CM | POA: Insufficient documentation

## 2024-01-13 NOTE — Progress Notes (Signed)
 Virtual Visit via Telephone Note   Because of FENIX RORKE co-morbid illnesses, he is at least at moderate risk for complications without adequate follow up.  This format is felt to be most appropriate for this patient at this time.  Due to technical limitations with video connection (technology), today's appointment will be conducted as an audio only telehealth visit, and Fred Russell verbally agreed to proceed in this manner.   All issues noted in this document were discussed and addressed.  No physical exam could be performed with this format.  Evaluation Performed:  Preoperative cardiovascular risk assessment _____________   Date:  01/13/2024   Patient ID:  Fred Russell, Fred Russell [birth year], MRN [id number] Patient Location:  Home Provider location:   Office  Primary Care Provider:  Gareth Morgan, MD Primary Cardiologist:  Tonny Bollman, MD  Chief Complaint / Patient Profile   73 y.o. y/o male with a h/o hx of cryptogenic stroke, PFO status post transcatheter closure in 2016, paroxysmal atrial fibrillation occurring after PFO closure and detected on a loop recorder, atrial tachycardia, and aortic root dilatation who is pending left thumb [facility] arthroplasty with double tendon transfer and fiber lock suspension and repair as necessary on 01/27/2024 by [facility]. Left thumb MCP fusion with reconstruction as necessary  and presents today for telephonic preoperative cardiovascular risk assessment.  History of Present Illness    Fred Russell is a 73 y.o. male who presents via audio/video conferencing for a telehealth visit today.  Pt was last seen in cardiology clinic on 02/28/2023 by Dr. Excell Seltzer.  At that time Fred Russell was doing well.  The patient is now pending procedure as outlined above. Since his last visit, he denies chest pain, shortness of breath, lower extremity edema, fatigue, palpitations, weakness, presyncope, syncope, orthopnea, and PND.  Past Medical History    Past  Medical History:  Diagnosis Date   Arthritis    frozen shoulder   Cryptogenic stroke (HCC)    a. 10/2015-->TEE showed PFO w/ R->L Shunt, s/p closure;  b. s/p MDT Linq.   Essential hypertension    Left foot drop 08/13/2015   PFO (patent foramen ovale)    a. 10/2015 s/p closure w/ 25 mm Amplatzer PFO occluder.   Right shoulder pain    Past Surgical History:  Procedure Laterality Date   BICEPT TENODESIS Right 08/07/2020   Procedure: BICEPS TENODESIS;  Surgeon: Bjorn Pippin, MD;  Location: [facility];  Service: Orthopedics;  Laterality: Right;   CARDIAC CATHETERIZATION N/A 11/14/2015   Procedure: PFO Closure;  Surgeon: Tonny Bollman, MD;  Location: [facility] INVASIVE CV LAB;  Service: Cardiovascular;  Laterality: N/A;   EP IMPLANTABLE DEVICE N/A 10/28/2015   Procedure: Loop Recorder Insertion;  Surgeon: Hillis Range, MD;  Location: [facility] INVASIVE CV LAB;  Service: Cardiovascular;  Laterality: N/A;   Implantable loop recorder removal  10/02/2018   MDT Linq removed by Dr Johney Frame in office   INGUINAL HERNIA REPAIR Right 09/25/2018   Procedure: OPEN RIGHT INGUINAL HERNIA REPAIR WITH INSERTION OF MESH;  Surgeon: Jimmye Norman, MD;  Location: [facility];  Service: General;  Laterality: Right;   ROTATOR CUFF REPAIR     x3   SHOULDER ARTHROSCOPY Right    x3   SHOULDER ARTHROSCOPY WITH ROTATOR CUFF REPAIR Right 08/07/2020   Procedure: RIGHT SHOULDER ARTHROSCOPY DEBRIDEMENT WITH ROTATOR CUFF REPAIR AND OPEN BICEPS TENODESIS;  Surgeon: Bjorn Pippin, MD;  Location: [facility];  Service:  Orthopedics;  Laterality: Right;   TEE WITHOUT CARDIOVERSION N/A 10/28/2015   Procedure: TRANSESOPHAGEAL ECHOCARDIOGRAM (TEE);  Surgeon: Laurey Morale, MD;  Location: [facility];  Service: Cardiovascular;  Laterality: N/A;    Allergies  No Known Allergies  Home Medications    Prior to Admission medications   Medication Sig Start Date End Date Taking? Authorizing Provider   aspirin EC 81 MG tablet Take 81 mg by mouth daily. Swallow whole.    [provider]  atorvastatin (LIPITOR) 40 MG tablet TAKE 1 TABLET BY MOUTH EACH DAY FOR HIGH CHOLESTEROL. 11/30/16   Tonny Bollman, MD  hydrochlorothiazide (HYDRODIURIL) 25 MG tablet Take 25 mg by mouth daily.    [provider]  lisinopril (PRINIVIL,ZESTRIL) 20 MG tablet Take 20 mg 2 (two) times daily by mouth.    [provider]  metoprolol succinate (TOPROL-XL) 25 MG 24 hr tablet Take 1 tablet (25 mg total) by mouth daily. 10/05/22   Tonny Bollman, MD  tadalafil (CIALIS) 20 MG tablet as needed. 02/08/23   [provider]    Physical Exam    Vital Signs:  Fred Russell does not have vital signs available for review today.  Given telephonic nature of communication, physical exam is limited. AAOx3. NAD. Normal affect.  Speech and respirations are unlabored.  Accessory Clinical Findings    None  Assessment & Plan    1.  Preoperative Cardiovascular Risk Assessment: According to the Revised Cardiac Risk Index (RCRI), his Perioperative Risk of Major Cardiac Event is (%): 0.4. His Functional Capacity in METs is: 9.89 according to the Duke Activity Status Index (DASI). Therefore, based on ACC/AHA guidelines, patient would be at acceptable risk for the planned procedure without further cardiovascular testing.   The patient was advised that if he develops new symptoms prior to surgery to contact our office to arrange for a follow-up visit, and he verbalized understanding.  Patient can hold aspirin for 5-7 days prior to procedure, please resume when safe to do so from a bleeding standpoint.   A copy of this note will be routed to requesting surgeon.  Time:   Today, I have spent 6 minutes with the patient with telehealth technology discussing medical history, symptoms, and management plan.     Denyce Robert, NP  01/13/2024, 7:34 AM

## 2024-01-30 ENCOUNTER — Ambulatory Visit (HOSPITAL_COMMUNITY): Payer: Medicare Other | Attending: Cardiology

## 2024-01-30 DIAGNOSIS — I2510 Atherosclerotic heart disease of native coronary artery without angina pectoris: Secondary | ICD-10-CM | POA: Diagnosis not present

## 2024-01-30 DIAGNOSIS — I10 Essential (primary) hypertension: Secondary | ICD-10-CM | POA: Diagnosis present

## 2024-01-30 DIAGNOSIS — I7121 Aneurysm of the ascending aorta, without rupture: Secondary | ICD-10-CM | POA: Insufficient documentation

## 2024-01-30 DIAGNOSIS — Q2112 Patent foramen ovale: Secondary | ICD-10-CM | POA: Insufficient documentation

## 2024-01-30 LAB — ECHOCARDIOGRAM COMPLETE
Area-P 1/2: 2.47 cm2
P 1/2 time: 1310 ms
S' Lateral: 3.1 cm

## 2024-02-03 ENCOUNTER — Other Ambulatory Visit: Payer: Self-pay

## 2024-02-03 MED ORDER — HYDROCHLOROTHIAZIDE 25 MG PO TABS: 25.0000 mg | ORAL_TABLET | Freq: Every day | ORAL | 0 refills | Status: DC

## 2024-02-03 NOTE — Telephone Encounter (Signed)
 Per OV note by Excell Seltzer 02/28/23: Blood pressure on my recheck is well-controlled at 126/74 mmHg. Continue current management. Component of whitecoat hypertension noted.   Medication was on active med list at this visit. Pt currently scheduled for his yearly appt on 02/16/24. Will send refill to pharmacy at this time.

## 2024-02-03 NOTE — Telephone Encounter (Signed)
 Pt's pharmacy is requesting a refill on medication hydrochlorothiazide. Dr. Excell Seltzer did not prescribe this medication. Would Dr. Excell Seltzer like to refill this medication? Please address

## 2024-02-06 ENCOUNTER — Other Ambulatory Visit: Payer: Self-pay

## 2024-02-06 MED ORDER — LISINOPRIL 20 MG PO TABS: 20.0000 mg | ORAL_TABLET | Freq: Two times a day (BID) | ORAL | 0 refills | Status: DC

## 2024-02-06 NOTE — Telephone Encounter (Signed)
 Pt's pharmacy is requesting a refill on lisinopril 20 mg tablets. This medication has not been refilled since 2018. Would Dr. Excell Seltzer like to refill this medication? Please address

## 2024-02-06 NOTE — Telephone Encounter (Signed)
 Pt seen by Excell Seltzer on 02/28/23: Blood pressure on my recheck is well-controlled at 126/74 mmHg. Continue current management. Component of whitecoat hypertension noted.   Pt currently scheduled for yearly visit on 02/18/24. Will send one month supply at this time.

## 2024-02-09 ENCOUNTER — Other Ambulatory Visit: Payer: Self-pay

## 2024-02-09 MED ORDER — ATORVASTATIN CALCIUM 40 MG PO TABS: 40.0000 mg | ORAL_TABLET | Freq: Every day | ORAL | 0 refills | Status: DC

## 2024-02-16 ENCOUNTER — Encounter: Payer: Self-pay | Admitting: Cardiovascular Disease

## 2024-02-16 ENCOUNTER — Ambulatory Visit: Payer: Medicare Other | Attending: Cardiovascular Disease | Admitting: Cardiovascular Disease

## 2024-02-16 VITALS — BP 144/92 | HR 53 | Ht 73.0 in | Wt 197.4 lb

## 2024-02-16 DIAGNOSIS — Q2112 Patent foramen ovale: Secondary | ICD-10-CM

## 2024-02-16 DIAGNOSIS — Z79899 Other long term (current) drug therapy: Secondary | ICD-10-CM

## 2024-02-16 DIAGNOSIS — I10 Essential (primary) hypertension: Secondary | ICD-10-CM

## 2024-02-16 DIAGNOSIS — I7121 Aneurysm of the ascending aorta, without rupture: Secondary | ICD-10-CM | POA: Diagnosis present

## 2024-02-16 DIAGNOSIS — E782 Mixed hyperlipidemia: Secondary | ICD-10-CM | POA: Diagnosis not present

## 2024-02-16 MED ORDER — ATORVASTATIN CALCIUM 40 MG PO TABS: 40.0000 mg | ORAL_TABLET | Freq: Every day | ORAL | 3 refills | Status: AC

## 2024-02-16 MED ORDER — EZETIMIBE 10 MG PO TABS: 10.0000 mg | ORAL_TABLET | Freq: Every day | ORAL | 3 refills | Status: DC

## 2024-02-16 NOTE — Assessment & Plan Note (Signed)
 Status post closure.  Recent echo shows complete closure with appropriate device position.  No follow-up indicated.  Continue on aspirin.

## 2024-02-16 NOTE — Assessment & Plan Note (Signed)
 LDL is 72.  Considering his coronary calcium score greater than 300 and moderate diffuse coronary plaquing, I have recommended adding Zetia 10 mg daily as I would like his goal LDL cholesterol to be less than 55.  Will repeat lipids and LFTs in 3 months.  States that he had an elevated glucose recently and I will add a chemistry panel to his labs in 3 months.

## 2024-02-16 NOTE — Assessment & Plan Note (Signed)
 Home blood pressures are ranging 120s over 70s.  He will continue on hydrochlorothiazide, lisinopril, and metoprolol succinate.

## 2024-02-16 NOTE — Patient Instructions (Signed)
 Medication Instructions:  START Zetia/Ezetimibe 10mg  daily *If you need a refill on your cardiac medications before your next appointment, please call your pharmacy*  Lab Work: CMET, LIPIDS in 3 months If you have labs (blood work) drawn today and your tests are completely normal, you will receive your results only by: MyChart Message (if you have MyChart) OR A paper copy in the mail If you have any lab test that is abnormal or we need to change your treatment, we will call you to review the results.  Follow-Up: At [facility], you and your health needs are our priority.  As part of our continuing mission to provide you with exceptional heart care, our providers are all part of one team.  This team includes your primary Cardiologist (physician) and Advanced Practice Providers or APPs (Physician Assistants and Nurse Practitioners) who all work together to provide you with the care you need, when you need it.  Your next appointment:   1 year(s)  Provider:   Tonny Bollman, MD        1st Floor: - Lobby - Registration  - Pharmacy  - Lab - Cafe  2nd Floor: - PV Lab - Diagnostic Testing (echo, CT, nuclear med)  3rd Floor: - Vacant  4th Floor: - TCTS (cardiothoracic surgery) - AFib Clinic - Structural Heart Clinic - Vascular Surgery  - Vascular Ultrasound  5th Floor: - [facility]Care Cardiology (general and EP) - Clinical Pharmacy for coumadin, hypertension, lipid, weight-loss medications, and med management appointments    Valet parking services will be available as well.

## 2024-02-16 NOTE — Progress Notes (Signed)
 Cardiology Office Note:    Date:  02/19/2024   ID:  Fred Russell 1951/03/20, MRN [id number]  PCP:  Fred Morgan, MD   [facility] Providers Cardiologist:  Tonny Bollman, MD     Referring MD: Fred Morgan, MD   Chief Complaint  Patient presents with   Follow-up    Hypertension    History of Present Illness:    Fred Russell is a 73 y.o. male with a hx of cryptogenic stroke, PFO status post transcatheter closure in 2016, paroxysmal atrial fibrillation occurring after PFO closure and detected on a loop recorder, atrial tachycardia, and aortic root dilatation.   The patient remains physically active.  He had surgery on his left hand and so he has not been able to swim recently.  He has no symptoms with exertion but states that his exercise tolerance is not quite as good as it has previously been.  Occasionally, when he wakes up at night to go to the bathroom, he feels a slight shortness of breath.  States that this resolves quickly and wonders if it could be related to his slow heart rate.  No orthopnea, PND, chest pain, or chest pressure.  No lightheadedness or syncope.  Current Medications: Current Meds  Medication Sig   aspirin EC 81 MG tablet Take 81 mg by mouth daily. Swallow whole.   atorvastatin (LIPITOR) 40 MG tablet Take 1 tablet (40 mg total) by mouth daily. For high Cholesterol   ezetimibe (ZETIA) 10 MG tablet Take 1 tablet (10 mg total) by mouth daily.   hydrochlorothiazide (HYDRODIURIL) 25 MG tablet Take 1 tablet (25 mg total) by mouth daily.   lisinopril (ZESTRIL) 20 MG tablet Take 1 tablet (20 mg total) by mouth 2 (two) times daily.   metoprolol succinate (TOPROL-XL) 25 MG 24 hr tablet Take 1 tablet (25 mg total) by mouth daily.   tadalafil (CIALIS) 20 MG tablet as needed.   [DISCONTINUED] atorvastatin (LIPITOR) 40 MG tablet Take 1 tablet (40 mg total) by mouth daily. For high Cholesterol     Allergies:   Patient has no known allergies.   ROS:    Please see the history of present illness.    All other systems reviewed and are negative.  EKGs/Labs/Other Studies Reviewed:    The following studies were reviewed today: Cardiac Studies & Procedures   ______________________________________________________________________________________________ CARDIAC CATHETERIZATION  CARDIAC CATHETERIZATION 11/14/2015  Narrative Successful transcatheter PFO closure using a 25 mm Amplatzer PFO occluder with no evidence of residual right to left shunt by agitated saline study at the completion of the procedure.     ECHOCARDIOGRAM  ECHOCARDIOGRAM COMPLETE 01/30/2024  Narrative ECHOCARDIOGRAM REPORT    Patient Name:   Fred Russell Date of Exam: 01/30/2024 Medical Rec #:  [id number]      Height:       74.0 in Accession #:    [id number]     Weight:       197.6 lb Date of Birth:  December 11, 1950      BSA:          2.162 m Patient Age:    72 years       BP:           157/94 mmHg Patient Gender: M              HR:           54 bpm. Exam Location:  [address]  Procedure: 2D Echo, 3D Echo, Cardiac Doppler  and Color Doppler (Both Spectral and Color Flow Doppler were utilized during procedure).  Indications:    Q21.12 PFO  History:        Patient has prior history of Echocardiogram examinations, most recent 12/01/2021. CAD, Stroke, Arrythmias:Atrial Fibrillation; Risk Factors:Hypertension. S/p closure 2016, CTA aortic root aneurysm 45mm.  Sonographer:    Clearence Ped RCS Referring Phys: [phone] Jaysa Kise  IMPRESSIONS   1. Left ventricular ejection fraction, by estimation, is 55 to 60%. Left ventricular ejection fraction by 3D volume is 57 %. The left ventricle has normal function. The left ventricle has no regional wall motion abnormalities. There is mild left ventricular hypertrophy of the infero-lateral segment. Left ventricular diastolic parameters are consistent with Grade I diastolic dysfunction (impaired relaxation). 2. Right  ventricular systolic function is normal. The right ventricular size is normal. There is normal pulmonary artery systolic pressure. The estimated right ventricular systolic pressure is 23.2 mmHg. 3. Left atrial size was mildly dilated. 4. Amplatzer PFO device in place. No shunt by color flow Doppler. 5. The mitral valve is normal in structure. Mild mitral valve regurgitation. No evidence of mitral stenosis. 6. The aortic valve is normal in structure. Aortic valve regurgitation is mild. No aortic stenosis is present. 7. Aortic dilatation noted. There is mild dilatation of the aortic root, measuring 44 mm. There is borderline dilatation of the ascending aorta, measuring 39 mm. 8. The inferior vena cava is normal in size with greater than 50% respiratory variability, suggesting right atrial pressure of 3 mmHg.  Comparison(s): Stable aortic root (prior 43 mm 2001, 47 mm 2023), now 44 mm.  FINDINGS Left Ventricle: Left ventricular ejection fraction, by estimation, is 55 to 60%. Left ventricular ejection fraction by 3D volume is 57 %. The left ventricle has normal function. The left ventricle has no regional wall motion abnormalities. The left ventricular internal cavity size was normal in size. There is mild left ventricular hypertrophy of the infero-lateral segment. Left ventricular diastolic parameters are consistent with Grade I diastolic dysfunction (impaired relaxation).  Right Ventricle: The right ventricular size is normal. No increase in right ventricular wall thickness. Right ventricular systolic function is normal. There is normal pulmonary artery systolic pressure. The tricuspid regurgitant velocity is 2.25 m/s, and with an assumed right atrial pressure of 3 mmHg, the estimated right ventricular systolic pressure is 23.2 mmHg.  Left Atrium: Left atrial size was mildly dilated.  Right Atrium: Right atrial size was normal in size.  Pericardium: There is no evidence of pericardial  effusion.  Mitral Valve: The mitral valve is normal in structure. Mild mitral valve regurgitation. No evidence of mitral valve stenosis.  Tricuspid Valve: The tricuspid valve is normal in structure. Tricuspid valve regurgitation is mild . No evidence of tricuspid stenosis.  Aortic Valve: The aortic valve is normal in structure. Aortic valve regurgitation is mild. Aortic regurgitation PHT measures 1310 msec. No aortic stenosis is present.  Pulmonic Valve: The pulmonic valve was normal in structure. Pulmonic valve regurgitation is trivial. No evidence of pulmonic stenosis.  Aorta: Aortic dilatation noted. There is mild dilatation of the aortic root, measuring 44 mm. There is borderline dilatation of the ascending aorta, measuring 39 mm.  Venous: The inferior vena cava is normal in size with greater than 50% respiratory variability, suggesting right atrial pressure of 3 mmHg.  IAS/Shunts: No atrial level shunt detected by color flow Doppler.  Additional Comments: 3D was performed not requiring image post processing on an independent workstation and was normal.  LEFT VENTRICLE PLAX 2D LVIDd:         4.30 cm         Diastology LVIDs:         3.10 cm         LV e' medial:    3.92 cm/s LV PW:         1.30 cm         LV E/e' medial:  11.1 LV IVS:        1.10 cm         LV e' lateral:   4.13 cm/s LVOT diam:     2.10 cm         LV E/e' lateral: 10.5 LV SV:         96 LV SV Index:   44 LVOT Area:     3.46 cm        3D Volume EF LV 3D EF:    Left ventricul ar ejection fraction by 3D volume is 57 %.  3D Volume EF: 3D EF:        57 % LV EDV:       175 ml LV ESV:       76 ml LV SV:        99 ml  RIGHT VENTRICLE RV Basal diam:  3.80 cm RV S prime:     15.90 cm/s TAPSE (M-mode): 2.2 cm RVSP:           23.2 mmHg  LEFT ATRIUM             Index        RIGHT ATRIUM           Index LA diam:        3.60 cm 1.66 cm/m   RA Pressure: 3.00 mmHg LA Vol (A2C):   82.6 ml 38.20 ml/m  RA  Area:     16.80 cm LA Vol (A4C):   49.9 ml 23.08 ml/m  RA Volume:   45.40 ml  21.00 ml/m LA Biplane Vol: 68.2 ml 31.54 ml/m AORTIC VALVE LVOT Vmax:   130.00 cm/s LVOT Vmean:  73.400 cm/s LVOT VTI:    0.276 m AI PHT:      1310 msec  AORTA Ao Root diam: 4.40 cm Ao Asc diam:  3.90 cm  MITRAL VALVE               TRICUSPID VALVE MV Area (PHT):             TR Peak grad:   20.2 mmHg MV Decel Time:             TR Vmax:        225.00 cm/s MV E velocity: 43.50 cm/s  Estimated RAP:  3.00 mmHg MV A velocity: 61.30 cm/s  RVSP:           23.2 mmHg MV E/A ratio:  0.71 SHUNTS Systemic VTI:  0.28 m Systemic Diam: 2.10 cm  Donato Schultz MD Electronically signed by Donato Schultz MD Signature Date/Time: 01/30/2024/12:27:24 PM    Final   TEE  ECHO TEE 10/28/2015  Narrative *[facility]* *[facility]* [address] [place], [place] [address] [phone]  ------------------------------------------------------------------- Transesophageal Echocardiography  Patient:    Fred Russell, Fred Russell MR #:       [id number] Study Date: 10/28/2015 Gender:     M Age:        36 Height:     185.4 cm Weight:  82.7 kg BSA:        2.07 m^2 Pt. Status: Room:       5C08C  PERFORMING   Marca Ancona, M.D. ADMITTING    Mcintyre, Estevan Ryder ATTENDING    Mcintyre, Togo SONOGRAPHER  Leta Jungling, RDCS ORDERING     Bhagat, Bhavinkumar REFERRING    Bhagat, Bhavinkumar  cc:  ------------------------------------------------------------------- LV EF: 55% -   60%  ------------------------------------------------------------------- Indications:      CVA 436.  ------------------------------------------------------------------- History:   PMH:  No prior cardiac history.  Risk factors: Hypertension.  ------------------------------------------------------------------- Study Conclusions  - Left ventricle: The cavity size was normal. Wall thickness was normal. Systolic function  was normal. The estimated ejection fraction was in the range of 55% to 60%. Wall motion was normal; there were no regional wall motion abnormalities. - Aortic valve: Trileaflet. There was no stenosis. There was mild regurgitation. 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 - Mitral valve: There was mild regurgitation. - Left atrium: No evidence of thrombus in the atrial cavity or appendage. - Right ventricle: The cavity size was normal. Systolic function was normal. - Right atrium: No evidence of thrombus in the atrial cavity or appendage. - Atrial septum: There was a moderate PFO with a markedly positive bubble study. - Tricuspid valve: Peak RV-RA gradient 20 mmHg.  Diagnostic transesophageal echocardiography.  2D and color Doppler. Birthdate:  Patient birthdate: 1951/06/19.  Age:  Patient is 73 yr old.  Sex:  Gender: male.    BMI: 24.1 kg/m^2.  Blood pressure: 131/71  Patient status:  Inpatient.  Study date:  Study date: 10/28/2015. Study time: 04:15 PM.  Location:  Endoscopy.  -------------------------------------------------------------------  ------------------------------------------------------------------- Left ventricle:  The cavity size was normal. Wall thickness was normal. Systolic function was normal. The estimated ejection fraction was in the range of 55% to 60%. Wall motion was normal; there were no regional wall motion abnormalities.  ------------------------------------------------------------------- Aortic valve:   Trileaflet.  Doppler:   There was no stenosis. There was mild regurgitation.  ------------------------------------------------------------------- Aorta:  Aneurysmal aortic root measuring 4.9 cm, ascending aorta measuring 4.0 cm.  ------------------------------------------------------------------- Mitral valve:   Normal thickness leaflets .  Doppler:   There was no evidence for stenosis.   There was mild  regurgitation.  ------------------------------------------------------------------- Left atrium:  The atrium was normal in size.  No evidence of thrombus in the atrial cavity or appendage.  ------------------------------------------------------------------- Atrial septum:  There was a moderate PFO with a markedly positive bubble study.  ------------------------------------------------------------------- Right ventricle:  The cavity size was normal. Systolic function was normal.  ------------------------------------------------------------------- Pulmonic valve:    Structurally normal valve.   Cusp separation was normal.  ------------------------------------------------------------------- Tricuspid valve:   Doppler:  There was trivial regurgitation. Peak RV-RA gradient 20 mmHg.  ------------------------------------------------------------------- Right atrium:  The atrium was normal in size.  No evidence of thrombus in the atrial cavity or appendage.  ------------------------------------------------------------------- Pericardium:  There was no pericardial effusion.  ------------------------------------------------------------------- Post procedure conclusions Ascending Aorta:  - Aneurysmal aortic root measuring 4.9 cm, ascending aorta measuring 4.0 cm.  ------------------------------------------------------------------- Prepared and Electronically Authenticated by  Marca Ancona, M.D. 2016-12-13T22:32:08  MONITORS  LONG TERM MONITOR (3-14 DAYS) 06/27/2020  Narrative 1. The basic rhythm is normal sinus with an average HR of 56 bpm 2. There is no atrial fibrillation or flutter 3. PVC's occur with a 1.8% burden 4. Multiple short supraventricular runs are present 5. Few runs of NSVT (8 beats of fast VT  and longer wide complex runs at 150 bpm or less)   CT SCANS  CT CORONARY FRACTIONAL FLOW RESERVE DATA PREP 01/05/2022  Narrative EXAM: CT FFR analysis was performed  on the original cardiac CTA dataset. Diagrammatic representation of the CT FFR analysis is provided in a separate PDF document in PACS. This dictation was created using the PDF document and an interactive 3D model of the results. The 3D model is not available in the EMR/PACS.  INTERPRETATION: CT FFR provides simultaneous calculation of pressure and flow across the entire coronary tree. For clinical decision making, CT FFR values should be obtained 1-2 cm distal to the lower border of each stenosis measured. Coronary CTA-related artifacts may impair the diagnostic accuracy of the original cardiac CTA and FFR CT results. *Due to the fact that CT FFR represents a mathematically-derived analysis, it is recommended that the results be interpreted as follows:  1. CT FFR >0.80: Low likelihood of hemodynamic significance. 2. CT FFR 0.76-0.80: Borderline likelihood of hemodynamic significance. 3. CT FFR =< 0.75: High likelihood of hemodynamic significance.  *Coronary CT Angiography-derived Fractional Flow Reserve Testing in Patients with Stable Coronary Artery Disease: Recommendations on Interpretation and Reporting. Radiology: Cardiothoracic Imaging. 2019;1(5):[id number]  FINDINGS: 1. Left Main: 1.0; low likelihood of hemodynamic significance. 2. Proximal LAD: 0.99; low likelihood of hemodynamic significance. 3. Mid LAD: 0.94; low likelihood of hemodynamic significance. 4. Distal LAD: 0.82; low likelihood of hemodynamic significance.  5. LCX: 0.99; low likelihood of hemodynamic significance. 6. Proximal RCA: 0.98; low likelihood of hemodynamic significance. 7. Mid RCA: 0.96; low likelihood of hemodynamic significance. 8. Distal RCA: 0.86; low likelihood of hemodynamic significance.  IMPRESSION:  1.  Proximal to mid LAD is negative by CT FFR (0.94).  2.  Distal RCA is negative by CT FFR (0.86).  Lennie Odor, MD   Electronically Signed By: Lennie Odor M.D. On: 01/05/2022  10:34   CT CORONARY MORPH W/CTA COR W/SCORE 01/05/2022  Addendum 01/05/2022 10:30 AM ADDENDUM REPORT: 01/05/2022 10:28  CLINICAL DATA:  Chest pain  EXAM: Cardiac/Coronary CTA  TECHNIQUE: A non-contrast, gated CT scan was obtained with axial slices of 3 mm through the heart for calcium scoring. Calcium scoring was performed using the Agatston method. A 100 kV prospective, gated, contrast cardiac scan was obtained. Gantry rotation speed was 250 msecs and collimation was 0.6 mm. Two sublingual nitroglycerin tablets (0.8 mg) were given. The 3D data set was reconstructed in 5% intervals of the 35-75% of the R-R cycle. Diastolic phases were analyzed on a dedicated workstation using MPR, MIP, and VRT modes. The patient received 95 cc of contrast.  FINDINGS: Image quality: Average due to poor injection rate (IV injection locked out due to poor placement).  Noise artifact is: Mild to moderate in the distal vessels.  Coronary Arteries:  Normal coronary origin.  Right dominance.  Left main: The left main is a large caliber vessel with a normal take off from the left coronary cusp that bifurcates to form a left anterior descending artery and a left circumflex artery. There is no plaque or stenosis.  Left anterior descending artery: There is mild to moderate mixed density plaque (40-60%) in the proximal to mid LAD. The distal LAD contains mild non-calcified plaque (25-49%). The LAD gives off 1 patent diagonal branch.  Left circumflex artery: The LCX is non-dominant and patent without plaque or stenosis. OM1 is patent. OM2 contains minimal non-calcified plaque (<25%).  Right coronary artery: The RCA is dominant with normal take off from the  right coronary cusp. The proximal and mid segments are patent. The distal RCA contains a moderate non-calcified plaque (50-69%). The RCA terminates as a PDA and without evidence of plaque or stenosis.  Right Atrium: Right atrial size is  dilated.  Right Ventricle: The right ventricular cavity is dilated.  Left Atrium: Left atrial size is normal in size with no left atrial appendage filling defect. IAS occluder device noted.  Left Ventricle: The ventricular cavity size is within normal limits. There are no stigmata of prior infarction. There is no abnormal filling defect.  Pulmonary arteries: Normal in size without proximal filling defect.  Pulmonary veins: Normal pulmonary venous drainage. Slit like orifice of the right lower pulmonary vein. Similar to prior.  Pericardium: Normal thickness with no significant effusion or calcium present.  Cardiac valves: The aortic valve is trileaflet without significant calcification. The mitral valve is normal structure without significant calcification.  Aorta: Aortic root is aneurysmal. NCC 42 mm. RCC 45 mm. LCC 45 mm. STJ 39 mm. Ascending aorta 40 mm.  Extra-cardiac findings: See attached radiology report for non-cardiac structures.  IMPRESSION: 1. Coronary calcium score of 488. This was 72nd percentile for age-, sex, and race-matched controls.  2. Normal coronary origin with right dominance.  3. Mild to moderate mixed density plaque in the proximal to mid LAD (40-60%).  4. Moderate non-calcified plaque (50-69%) in the distal RCA.  5. 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  6. IAS occluder present.  RECOMMENDATIONS: 1. Moderate stenosis. Consider symptom-guided anti-ischemic pharmacotherapy as well as risk factor modification per guideline directed care. Additional analysis with CT FFR will be submitted.  Lennie Odor, MD   Electronically Signed By: Lennie Odor M.D. On: 01/05/2022 10:28  Narrative EXAM: OVER-READ INTERPRETATION  CT CHEST  The following report is an over-read performed by radiologist Dr. Trudie Reed of [facility] on 01/05/2022. This over-read does not include interpretation of cardiac or coronary anatomy or pathology.  The coronary calcium score/coronary CTA interpretation by the cardiologist is attached.  COMPARISON:  Cardiac CT 06/12/2019.  FINDINGS: Atherosclerotic calcifications in the thoracic aorta. Within the visualized portions of the thorax there are no suspicious appearing pulmonary nodules or masses, there is no acute consolidative airspace disease, no pleural effusions, no pneumothorax and no lymphadenopathy. Visualized portions of the upper abdomen are unremarkable. There are no aggressive appearing lytic or blastic lesions noted in the visualized portions of the skeleton. Severe dextroscoliosis of the thoracic spine again noted.  IMPRESSION: 1.  Aortic Atherosclerosis (ICD10-I70.0).  Electronically Signed: By: Trudie Reed M.D. On: 01/05/2022 08:03   CT SCANS  CT CORONARY FRACTIONAL FLOW RESERVE DATA PREP 06/12/2019  Narrative CLINICAL DATA:  Chest pain  EXAM: CT FFR  MEDICATIONS: No additional medications.  TECHNIQUE: The coronary CTA was sent for FFR.  FINDINGS: FFR 0.81 mid LAD  FFR 0.86 mid D1  IMPRESSION: Proximal/mid LAD stenosis does not appear to be hemodynamically significant.  Dalton Mclean   Electronically Signed By: Marca Ancona M.D. On: 06/13/2019 17:56     ______________________________________________________________________________________________      EKG:   EKG Interpretation Date/Time:  Thursday February 16 2024 13:46:35 EDT Ventricular Rate:  53 PR Interval:  170 QRS Duration:  92 QT Interval:  438 QTC Calculation: 410 R Axis:   -11  Text Interpretation: Sinus bradycardia When compared with ECG of 26-Oct-2015 15:17, PREVIOUS ECG IS PRESENT Confirmed by Tonny Bollman [phone]) on 02/16/2024 1:59:10 PM    Recent Labs: 03/01/2023: ALT 25; BUN  19; Creatinine, Ser 1.11; Hemoglobin 15.4; Platelets 188; Potassium 4.2; Sodium 141; TSH 1.630  Recent Lipid Panel    Component Value Date/Time   CHOL 129 03/01/2023 0816   TRIG 87  03/01/2023 0816   HDL 40 03/01/2023 0816   CHOLHDL 2.9 12/25/2021 0728   CHOLHDL 5.2 10/27/2015 0317   VLDL 24 10/27/2015 0317   LDLCALC 72 03/01/2023 0816          Physical Exam:    VS:  BP (!) 144/92   Pulse (!) 53   Ht 6\' 1"  (1.854 m)   Wt 197 lb 6.4 oz (89.5 kg)   SpO2 98%   BMI 26.04 kg/m     Wt Readings from Last 3 Encounters:  02/16/24 197 lb 6.4 oz (89.5 kg)  02/28/23 197 lb 9.6 oz (89.6 kg)  12/23/21 194 lb 3.2 oz (88.1 kg)     GEN:  Well nourished, well developed in no acute distress HEENT: Normal NECK: No JVD; No carotid bruits LYMPHATICS: No lymphadenopathy CARDIAC: RRR, no murmurs, rubs, gallops RESPIRATORY:  Clear to auscultation without rales, wheezing or rhonchi  ABDOMEN: Soft, non-tender, non-distended MUSCULOSKELETAL:  No edema; No deformity  SKIN: Warm and dry NEUROLOGIC:  Alert and oriented x 3 PSYCHIATRIC:  Normal affect   Assessment & Plan Essential hypertension Home blood pressures are ranging 120s over 70s.  He will continue on hydrochlorothiazide, lisinopril, and metoprolol succinate. PFO (patent foramen ovale) Status post closure.  Recent echo shows complete closure with appropriate device position.  No follow-up indicated.  Continue on aspirin. Medication management Add Zetia, see below Aneurysm of ascending aorta without rupture (HCC) Stable on serial imaging studies.  Recent echo shows ascending aorta 39 mm, aortic root 44 mm.  This is smaller than that measured on the previous echo.  Consistent with previous CTA studies where his maximal aortic diameter was 45 mm. Mixed hyperlipidemia LDL is 72.  Considering his coronary calcium score greater than 300 and moderate diffuse coronary plaquing, I have recommended adding Zetia 10 mg daily as I would like his goal LDL cholesterol to be less than 55.  Will repeat lipids and LFTs in 3 months.  States that he had an elevated glucose recently and I will add a chemistry panel to his labs in 3  months.      Medication Adjustments/Labs and Tests Ordered: Current medicines are reviewed at length with the patient today.  Concerns regarding medicines are outlined above.  Orders Placed This Encounter  Procedures   Comprehensive metabolic panel with GFR   Lipid panel   EKG 12-Lead   Meds ordered this encounter  Medications   atorvastatin (LIPITOR) 40 MG tablet    Sig: Take 1 tablet (40 mg total) by mouth daily. For high Cholesterol    Dispense:  90 tablet    Refill:  3    Pt must keep upcoming appt in April 2025 with Dr. Excell Seltzer before anymore refills. Thank you Final Attempt   ezetimibe (ZETIA) 10 MG tablet    Sig: Take 1 tablet (10 mg total) by mouth daily.    Dispense:  90 tablet    Refill:  3    Patient Instructions  Medication Instructions:  START Zetia/Ezetimibe 10mg  daily *If you need a refill on your cardiac medications before your next appointment, please call your pharmacy*  Lab Work: CMET, LIPIDS in 3 months If you have labs (blood work) drawn today and your tests are completely normal, you will receive your results only  by: MyChart Message (if you have MyChart) OR A paper copy in the mail If you have any lab test that is abnormal or we need to change your treatment, we will call you to review the results.  Follow-Up: At [facility] you and your health needs are our priority.  As part of our continuing mission to provide you with exceptional heart care, our providers are all part of one team.  This team includes your primary Cardiologist (physician) and Advanced Practice Providers or APPs (Physician Assistants and Nurse Practitioners) who all work together to provide you with the care you need, when you need it.  Your next appointment:   1 year(s)  Provider:   Tonny Bollman, MD        1st Floor: - Lobby - Registration  - Pharmacy  - Lab - Cafe  2nd Floor: - PV Lab - Diagnostic Testing (echo, CT, nuclear med)  3rd Floor: -  [facility]  4th Floor: - TCTS (cardiothoracic surgery) - AFib Clinic - Structural Heart Clinic - Vascular Surgery  - Vascular Ultrasound  5th Floor: - [facility] (general and EP) - Clinical Pharmacy for coumadin, hypertension, lipid, weight-loss medications, and med management appointments    Valet parking services will be available as well.     Signed, Tonny Bollman, MD  02/19/2024 8:53 AM    [facility]

## 2024-04-23 ENCOUNTER — Other Ambulatory Visit: Payer: Self-pay | Admitting: Cardiovascular Disease

## 2024-05-01 ENCOUNTER — Other Ambulatory Visit: Payer: Self-pay | Admitting: Cardiovascular Disease

## 2024-05-16 LAB — COMPREHENSIVE METABOLIC PANEL WITH GFR
ALT: 23 IU/L (ref 0–44)
AST: 18 IU/L (ref 0–40)
Albumin: 4.2 g/dL (ref 3.8–4.8)
Alkaline Phosphatase: 52 IU/L (ref 44–121)
BUN/Creatinine Ratio: 18 (ref 10–24)
BUN: 21 mg/dL (ref 8–27)
Bilirubin Total: 1.6 mg/dL — ABNORMAL HIGH (ref 0.0–1.2)
CO2: 23 mmol/L (ref 20–29)
Calcium: 9.4 mg/dL (ref 8.6–10.2)
Chloride: 103 mmol/L (ref 96–106)
Creatinine, Ser: 1.14 mg/dL (ref 0.76–1.27)
Globulin, Total: 2.2 g/dL (ref 1.5–4.5)
Glucose: 94 mg/dL (ref 70–99)
Potassium: 4 mmol/L (ref 3.5–5.2)
Sodium: 142 mmol/L (ref 134–144)
Total Protein: 6.4 g/dL (ref 6.0–8.5)
eGFR: 68 mL/min/{1.73_m2} (ref >59)

## 2024-05-16 LAB — LIPID PANEL
Chol/HDL Ratio: 2.6 ratio (ref 0.0–5.0)
Cholesterol, Total: 96 mg/dL — ABNORMAL LOW (ref 100–199)
HDL: 37 mg/dL — ABNORMAL LOW (ref >39)
LDL Chol Calc (NIH): 45 mg/dL (ref 0–99)
Triglycerides: 62 mg/dL (ref 0–149)
VLDL Cholesterol Cal: 14 mg/dL (ref 5–40)

## 2024-05-17 ENCOUNTER — Ambulatory Visit: Payer: Self-pay | Admitting: Cardiovascular Disease

## 2024-05-17 DIAGNOSIS — Q2112 Patent foramen ovale: Secondary | ICD-10-CM

## 2024-05-17 DIAGNOSIS — Z79899 Other long term (current) drug therapy: Secondary | ICD-10-CM

## 2024-05-17 DIAGNOSIS — I10 Essential (primary) hypertension: Secondary | ICD-10-CM

## 2024-05-17 MED ORDER — EZETIMIBE 10 MG PO TABS: 10.0000 mg | ORAL_TABLET | Freq: Every day | ORAL | 3 refills | Status: AC

## 2024-06-04 ENCOUNTER — Other Ambulatory Visit: Payer: Self-pay | Admitting: Cardiovascular Disease

## 2024-06-04 DIAGNOSIS — I480 Paroxysmal atrial fibrillation: Secondary | ICD-10-CM

## 2025-02-18 ENCOUNTER — Ambulatory Visit: Admitting: Cardiovascular Disease
